# Patient Record
Sex: Male | Born: 1955 | Race: Black or African American | Hispanic: No | Marital: Married | Smoking: Former smoker
Health system: Southern US, Community
[De-identification: ages and names within clinical notes are randomized; demographics above are authoritative.]

## PROBLEM LIST (undated history)

## (undated) DIAGNOSIS — E119 Type 2 diabetes mellitus without complications: Secondary | ICD-10-CM

## (undated) DIAGNOSIS — N186 End stage renal disease: Secondary | ICD-10-CM

## (undated) DIAGNOSIS — Z923 Personal history of irradiation: Secondary | ICD-10-CM

## (undated) DIAGNOSIS — K649 Unspecified hemorrhoids: Secondary | ICD-10-CM

## (undated) DIAGNOSIS — G4730 Sleep apnea, unspecified: Secondary | ICD-10-CM

## (undated) DIAGNOSIS — Z973 Presence of spectacles and contact lenses: Secondary | ICD-10-CM

## (undated) DIAGNOSIS — I10 Essential (primary) hypertension: Secondary | ICD-10-CM

## (undated) DIAGNOSIS — M1990 Unspecified osteoarthritis, unspecified site: Secondary | ICD-10-CM

## (undated) DIAGNOSIS — Z9189 Other specified personal risk factors, not elsewhere classified: Secondary | ICD-10-CM

## (undated) DIAGNOSIS — J449 Chronic obstructive pulmonary disease, unspecified: Secondary | ICD-10-CM

## (undated) DIAGNOSIS — K219 Gastro-esophageal reflux disease without esophagitis: Secondary | ICD-10-CM

## (undated) DIAGNOSIS — C61 Malignant neoplasm of prostate: Secondary | ICD-10-CM

## (undated) DIAGNOSIS — E785 Hyperlipidemia, unspecified: Secondary | ICD-10-CM

## (undated) DIAGNOSIS — M109 Gout, unspecified: Secondary | ICD-10-CM

## (undated) DIAGNOSIS — Z972 Presence of dental prosthetic device (complete) (partial): Secondary | ICD-10-CM

## (undated) DIAGNOSIS — Z992 Dependence on renal dialysis: Secondary | ICD-10-CM

## (undated) HISTORY — PX: PROSTATE BIOPSY: SHX241

## (undated) HISTORY — DX: Gout, unspecified: M10.9

## (undated) HISTORY — PX: EYE SURGERY: SHX253

## (undated) HISTORY — DX: Essential (primary) hypertension: I10

## (undated) HISTORY — DX: Dependence on renal dialysis: N18.6

## (undated) HISTORY — DX: Dependence on renal dialysis: Z99.2

## (undated) HISTORY — DX: Hyperlipidemia, unspecified: E78.5

---

## 1983-02-17 HISTORY — PX: KNEE ARTHROSCOPY: SUR90

## 1998-05-15 ENCOUNTER — Emergency Department (HOSPITAL_COMMUNITY): Admission: EM | Admit: 1998-05-15 | Discharge: 1998-05-15 | Payer: Self-pay | Admitting: Emergency Medicine

## 1998-05-15 ENCOUNTER — Encounter: Payer: Self-pay | Admitting: Emergency Medicine

## 2000-06-11 ENCOUNTER — Encounter: Payer: Self-pay | Admitting: Preventative Medicine

## 2000-06-11 ENCOUNTER — Ambulatory Visit (HOSPITAL_COMMUNITY): Admission: RE | Admit: 2000-06-11 | Discharge: 2000-06-11 | Payer: Self-pay | Admitting: Preventative Medicine

## 2001-07-30 ENCOUNTER — Emergency Department (HOSPITAL_COMMUNITY): Admission: EM | Admit: 2001-07-30 | Discharge: 2001-07-30 | Payer: Self-pay | Admitting: Emergency Medicine

## 2002-05-19 ENCOUNTER — Encounter: Payer: Self-pay | Admitting: Emergency Medicine

## 2002-05-19 ENCOUNTER — Emergency Department (HOSPITAL_COMMUNITY): Admission: EM | Admit: 2002-05-19 | Discharge: 2002-05-20 | Payer: Self-pay | Admitting: *Deleted

## 2005-06-01 ENCOUNTER — Ambulatory Visit (HOSPITAL_COMMUNITY): Admission: RE | Admit: 2005-06-01 | Discharge: 2005-06-01 | Payer: Self-pay | Admitting: Internal Medicine

## 2005-06-01 ENCOUNTER — Encounter: Payer: Self-pay | Admitting: Internal Medicine

## 2005-06-01 ENCOUNTER — Ambulatory Visit: Payer: Self-pay | Admitting: Internal Medicine

## 2005-06-01 ENCOUNTER — Ambulatory Visit (HOSPITAL_COMMUNITY): Admission: RE | Admit: 2005-06-01 | Discharge: 2005-06-01 | Payer: Self-pay | Admitting: Family Medicine

## 2005-06-01 HISTORY — PX: COLONOSCOPY: SHX174

## 2011-02-25 ENCOUNTER — Other Ambulatory Visit (HOSPITAL_COMMUNITY): Payer: Self-pay | Admitting: Urology

## 2011-02-25 DIAGNOSIS — R972 Elevated prostate specific antigen [PSA]: Secondary | ICD-10-CM

## 2011-03-03 ENCOUNTER — Other Ambulatory Visit (HOSPITAL_COMMUNITY): Payer: Self-pay

## 2011-05-08 ENCOUNTER — Telehealth (HOSPITAL_COMMUNITY): Payer: Self-pay | Admitting: Dietician

## 2011-05-08 NOTE — Telephone Encounter (Signed)
Received referral for Dr. Antonietta Barcelona office for dx: diabetes.

## 2011-05-14 NOTE — Telephone Encounter (Signed)
Sent letter to pt home via US Mail in attempt to contact pt to schedule appointment.  

## 2011-05-20 NOTE — Telephone Encounter (Signed)
Sent letter to pt home via US Mail in attempt to contact pt to schedule appointment.  

## 2011-05-26 NOTE — Telephone Encounter (Signed)
Pt has not responded to attempts to contact to schedule appointment. Referral filed.  

## 2011-12-15 ENCOUNTER — Ambulatory Visit (INDEPENDENT_AMBULATORY_CARE_PROVIDER_SITE_OTHER): Payer: BC Managed Care – PPO | Admitting: Family Medicine

## 2011-12-15 ENCOUNTER — Encounter: Payer: Self-pay | Admitting: Family Medicine

## 2011-12-15 VITALS — BP 160/74 | HR 76 | Resp 15 | Ht 76.0 in | Wt 289.4 lb

## 2011-12-15 DIAGNOSIS — E119 Type 2 diabetes mellitus without complications: Secondary | ICD-10-CM

## 2011-12-15 DIAGNOSIS — I10 Essential (primary) hypertension: Secondary | ICD-10-CM

## 2011-12-15 DIAGNOSIS — E669 Obesity, unspecified: Secondary | ICD-10-CM

## 2011-12-15 DIAGNOSIS — E118 Type 2 diabetes mellitus with unspecified complications: Secondary | ICD-10-CM | POA: Insufficient documentation

## 2011-12-15 DIAGNOSIS — N189 Chronic kidney disease, unspecified: Secondary | ICD-10-CM

## 2011-12-15 DIAGNOSIS — C61 Malignant neoplasm of prostate: Secondary | ICD-10-CM

## 2011-12-15 NOTE — Assessment & Plan Note (Signed)
Uncontrolled, with diabetic retinopathy as complication Check [id number], his fastings sound like they are improved

## 2011-12-15 NOTE — Assessment & Plan Note (Signed)
Obtain repeat labs, will likely need referral to renal, no signs of obstruction

## 2011-12-15 NOTE — Assessment & Plan Note (Signed)
Uncontrolled, no change to day based on intial reading, review records

## 2011-12-15 NOTE — Progress Notes (Signed)
  Subjective:    Patient ID: Bob Maldonado, male    DOB: 07/16/1955, 56 y.o.   MRN: [id number]  HPI  Pt here to establish care, previous PCP [facility] [place] [facility] and history reviewed Diagnosed with prostate cancer in Feb 2013, was to have intervention however this was put on hold due to CRI with creatinine at 2.2 in April , this has not been worked up per report. He did have MRI abd/pelvis at [facility] DM- for past 15 years, taken off of oral medications this year because of renal problems, has been on glucovance, januvia, glimperide, currently with A1C 9% in Feb, on Lantus 20 units, fasting 79-180 Has diabetic retinopathy recently treated  Review of Systems   GEN- denies fatigue, fever, weight loss,weakness, recent illness HEENT- denies eye drainage, change in vision, nasal discharge, CVS- denies chest pain, palpitations RESP- denies SOB, cough, wheeze ABD- denies N/V, change in stools, abd pain GU- denies dysuria, hematuria, dribbling, incontinence MSK- denies joint pain, muscle aches, injury Neuro- denies headache, dizziness, syncope, seizure activity      Objective:   Physical Exam  GEN- NAD, alert and oriented x3, obese HEENT- PERRL, EOMI, non injected sclera, pink conjunctiva, MMM, oropharynx clear Neck- Supple,  CVS- RRR, no murmur RESP-CTAB ABS-NABS,soft,NT,ND EXT- No edema Pulses- Radial, DP- 2+ Psych-normal affect and Mood      Assessment & Plan:

## 2011-12-15 NOTE — Patient Instructions (Addendum)
Get the labs done today You will need referral pending on your labs  No change in medications today  We will call with lab results  Records to be obtained  F/U 3 months

## 2011-12-15 NOTE — Assessment & Plan Note (Addendum)
Records to be obtained, his renal insufficiency needs to be worked up so that he can proceed with treatment, though he does not seem to be expect any haste in getting treatment

## 2011-12-17 ENCOUNTER — Ambulatory Visit: Payer: Self-pay | Admitting: Family Medicine

## 2012-02-18 LAB — CBC
Platelets: 265 10*3/uL (ref 150–400)
RDW: 15 % (ref 11.5–15.5)
WBC: 6.3 10*3/uL (ref 4.0–10.5)

## 2012-02-19 ENCOUNTER — Telehealth: Payer: Self-pay | Admitting: Family Medicine

## 2012-02-19 ENCOUNTER — Other Ambulatory Visit: Payer: Self-pay

## 2012-02-19 DIAGNOSIS — N189 Chronic kidney disease, unspecified: Secondary | ICD-10-CM

## 2012-02-19 LAB — BASIC METABOLIC PANEL
Calcium: 8.3 mg/dL — ABNORMAL LOW (ref 8.4–10.5)
Chloride: 109 mEq/L (ref 96–112)
Creat: 4.04 mg/dL — ABNORMAL HIGH (ref 0.50–1.35)
Sodium: 139 mEq/L (ref 135–145)

## 2012-02-19 LAB — ESTIMATED GFR
GFR, Est African American: 18 mL/min — ABNORMAL LOW
GFR, Est Non African American: 15 mL/min — ABNORMAL LOW

## 2012-02-19 MED ORDER — INSULIN GLARGINE 100 UNIT/ML ~~LOC~~ SOLN: 20.0000 [IU] | Freq: Every day | SUBCUTANEOUS | Status: DC

## 2012-02-19 MED ORDER — INSULIN PEN NEEDLE 31G X 8 MM MISC: 20.0000 [IU] | Freq: Every day | Status: DC

## 2012-02-19 NOTE — Telephone Encounter (Signed)
Pt given results, increase Lantus to 24 units Appt with nephrology on Wed for CKD worsening He is asymptomatic, urinating without difficulty no swelling, SOB, CP

## 2012-02-24 ENCOUNTER — Other Ambulatory Visit (HOSPITAL_COMMUNITY): Payer: Self-pay | Admitting: Nephrology

## 2012-02-24 DIAGNOSIS — N289 Disorder of kidney and ureter, unspecified: Secondary | ICD-10-CM

## 2012-03-02 ENCOUNTER — Ambulatory Visit (HOSPITAL_COMMUNITY)
Admission: RE | Admit: 2012-03-02 | Discharge: 2012-03-02 | Disposition: A | Discharge status: Home / Self Care | Payer: BC Managed Care – PPO | Source: Ambulatory Visit | Attending: Nephrology | Admitting: Nephrology

## 2012-03-02 DIAGNOSIS — N289 Disorder of kidney and ureter, unspecified: Secondary | ICD-10-CM

## 2012-03-25 ENCOUNTER — Other Ambulatory Visit: Payer: Self-pay | Admitting: Family Medicine

## 2012-04-19 ENCOUNTER — Other Ambulatory Visit: Payer: Self-pay

## 2012-04-19 MED ORDER — PRAVASTATIN SODIUM 40 MG PO TABS: 40.0000 mg | ORAL_TABLET | Freq: Every day | ORAL | Status: DC

## 2012-06-17 ENCOUNTER — Other Ambulatory Visit: Payer: Self-pay | Admitting: *Deleted

## 2012-06-19 ENCOUNTER — Other Ambulatory Visit: Payer: Self-pay | Admitting: Family Medicine

## 2012-07-12 ENCOUNTER — Ambulatory Visit (INDEPENDENT_AMBULATORY_CARE_PROVIDER_SITE_OTHER): Payer: BC Managed Care – PPO | Admitting: Family Medicine

## 2012-07-12 ENCOUNTER — Encounter: Payer: Self-pay | Admitting: Family Medicine

## 2012-07-12 VITALS — BP 160/90 | HR 63 | Resp 16 | Wt 280.0 lb

## 2012-07-12 DIAGNOSIS — E785 Hyperlipidemia, unspecified: Secondary | ICD-10-CM

## 2012-07-12 DIAGNOSIS — E119 Type 2 diabetes mellitus without complications: Secondary | ICD-10-CM

## 2012-07-12 DIAGNOSIS — M7989 Other specified soft tissue disorders: Secondary | ICD-10-CM

## 2012-07-12 DIAGNOSIS — M109 Gout, unspecified: Secondary | ICD-10-CM

## 2012-07-12 DIAGNOSIS — N189 Chronic kidney disease, unspecified: Secondary | ICD-10-CM

## 2012-07-12 DIAGNOSIS — E669 Obesity, unspecified: Secondary | ICD-10-CM

## 2012-07-12 DIAGNOSIS — I10 Essential (primary) hypertension: Secondary | ICD-10-CM

## 2012-07-12 DIAGNOSIS — C61 Malignant neoplasm of prostate: Secondary | ICD-10-CM

## 2012-07-12 DIAGNOSIS — E118 Type 2 diabetes mellitus with unspecified complications: Secondary | ICD-10-CM

## 2012-07-12 MED ORDER — PRAVASTATIN SODIUM 40 MG PO TABS: 40.0000 mg | ORAL_TABLET | Freq: Every day | ORAL | Status: DC

## 2012-07-12 MED ORDER — NIFEDIPINE ER 90 MG PO TB24: 90.0000 mg | ORAL_TABLET | Freq: Every day | ORAL | Status: DC

## 2012-07-12 MED ORDER — GLIMEPIRIDE 4 MG PO TABS: ORAL_TABLET | ORAL | Status: DC

## 2012-07-12 MED ORDER — PREDNISONE 10 MG PO TABS: ORAL_TABLET | ORAL | Status: DC

## 2012-07-12 MED ORDER — FUROSEMIDE 20 MG PO TABS: 20.0000 mg | ORAL_TABLET | Freq: Two times a day (BID) | ORAL | Status: DC

## 2012-07-12 MED ORDER — METOPROLOL SUCCINATE ER 100 MG PO TB24: 100.0000 mg | ORAL_TABLET | Freq: Every day | ORAL | Status: DC

## 2012-07-12 NOTE — Patient Instructions (Signed)
Increase lantus to 30units  Start prednisone as directed Epsom salt bath  Get the labs done in the morning  Continue the lasix  F/U 3 months [employer]

## 2012-07-12 NOTE — Progress Notes (Signed)
  Subjective:    Patient ID: Bob Maldonado, male    DOB: 12-22-1955, 57 y.o.   MRN: [id number]  HPI Patient is here secondary to right great toe swelling and some knee pain. This started about 8 days ago he's been told he has gout in the past. Pain started with swelling and pain in his right knee then it went to his great toe it has been significantly painful since then. He did take some over-the-counter ibuprofen. Of note he has not followed up since his initial visit in October 2013. He has untreated prostate cancer as well as chronic kidney disease with the last creatinine at 4 he did see nephrology for one visit however he was told he would need dialysis therefore he did not return. There was a medication changes losartan was discontinued he was started on Lasix twice a day as well as nifedipine. His blood sugars have been 150s fasting he denies any hypoglycemia.   Review of Systems  GEN- denies fatigue, fever, weight loss,weakness, recent illness HEENT- denies eye drainage, change in vision, nasal discharge, CVS- denies chest pain, palpitations RESP- denies SOB, cough, wheeze ABD- denies N/V, change in stools, abd pain GU- denies dysuria, hematuria, dribbling, incontinence MSK- + joint pain, muscle aches, injury Neuro- denies headache, dizziness, syncope, seizure activity      Objective:   Physical Exam GEN- NAD, alert and oriented x3, obese +weight loss HEENT- PERRL, EOMI, non injected sclera, pink conjunctiva, MMM, oropharynx clear CVS- RRR, no murmur RESP-CTAB ABS-NABS,soft,NT,ND EXT- trace pitting  Edema RLE, swelling right great toe, TTP decrease ROM with flexion due to pain, bilateral knees, effusion, fair ROM, liagaments in tact  Pulses- Radial, DP- 2+        Assessment & Plan:

## 2012-07-13 LAB — CBC
HCT: 35.5 % — ABNORMAL LOW (ref 39.0–52.0)
MCH: 26.5 pg (ref 26.0–34.0)
MCV: 79.8 fL (ref 78.0–100.0)
RDW: 14.6 % (ref 11.5–15.5)
WBC: 12.4 10*3/uL — ABNORMAL HIGH (ref 4.0–10.5)

## 2012-07-14 ENCOUNTER — Telehealth: Payer: Self-pay | Admitting: Family Medicine

## 2012-07-14 DIAGNOSIS — M109 Gout, unspecified: Secondary | ICD-10-CM | POA: Insufficient documentation

## 2012-07-14 DIAGNOSIS — E785 Hyperlipidemia, unspecified: Secondary | ICD-10-CM | POA: Insufficient documentation

## 2012-07-14 LAB — URIC ACID: Uric Acid, Serum: 9.9 mg/dL — ABNORMAL HIGH (ref 4.0–7.8)

## 2012-07-14 LAB — COMPREHENSIVE METABOLIC PANEL
ALT: 12 U/L (ref 0–53)
AST: 12 U/L (ref 0–37)
Albumin: 3.5 g/dL (ref 3.5–5.2)
Alkaline Phosphatase: 113 U/L (ref 39–117)
BUN: 90 mg/dL — ABNORMAL HIGH (ref 6–23)
Chloride: 106 mEq/L (ref 96–112)
Potassium: 5.1 mEq/L (ref 3.5–5.3)
Sodium: 140 mEq/L (ref 135–145)

## 2012-07-14 LAB — LIPID PANEL
Cholesterol: 248 mg/dL — ABNORMAL HIGH (ref 0–200)
LDL Cholesterol: 171 mg/dL — ABNORMAL HIGH (ref 0–99)
Total CHOL/HDL Ratio: 4.1 Ratio
VLDL: 17 mg/dL (ref 0–40)

## 2012-07-14 NOTE — Assessment & Plan Note (Signed)
He continues to decline any treatment for his prostate cancer. He is a very religious person and does not want any intervention

## 2012-07-14 NOTE — Assessment & Plan Note (Signed)
Continues to work on weight loss.

## 2012-07-14 NOTE — Telephone Encounter (Signed)
Called and left message for patient to discuss his labs. She needs to return to nephrology his renal function is around 10%. His creatinine has worsened to 6.2 we need to increase his Lasix to 40 mg twice a day

## 2012-07-14 NOTE — Assessment & Plan Note (Signed)
Gout attack , due to CKD, will use prednisone to help control pain

## 2012-07-14 NOTE — Assessment & Plan Note (Signed)
His repeat labs show severely worsening kidney function his GFR is only at 10. He needs followup with nephrology I will call and discuss this with him

## 2012-07-14 NOTE — Assessment & Plan Note (Signed)
Repeat A1c shows fairly well control of diabetes at 7.1%. He will be on prednisone for his gout therefore we have increased his Lantus to 30 units

## 2012-07-14 NOTE — Assessment & Plan Note (Signed)
LDL is above goal of 100 for pt with CKD and DM, he needs statin therapy, will see if we can get him to comply with other treatments, will need statin drug in near future

## 2012-07-14 NOTE — Assessment & Plan Note (Addendum)
Pressure is uncontrolled, . After review of his labs and discussion with nephrology we will increase his Lasix to 40 mg twice a day

## 2012-07-15 NOTE — Telephone Encounter (Signed)
See lab note, discussed with pt Left VM for [facility]- Dr. Hinda Lenis to get appt ASAP

## 2012-07-20 ENCOUNTER — Telehealth: Payer: Self-pay | Admitting: Family Medicine

## 2012-07-20 ENCOUNTER — Other Ambulatory Visit: Payer: Self-pay | Admitting: Family Medicine

## 2012-07-20 NOTE — Telephone Encounter (Signed)
Do you want this pt to continue with predinisone?

## 2012-07-20 NOTE — Telephone Encounter (Signed)
No further refills

## 2012-07-20 NOTE — Telephone Encounter (Signed)
Refill denied 

## 2012-08-03 ENCOUNTER — Telehealth: Payer: Self-pay | Admitting: Family Medicine

## 2012-08-03 NOTE — Telephone Encounter (Signed)
Left a message for Bob Maldonado on wifes number as he does not answer his cell phone I need to discuss with him CKD stage VI and his decision on dialysis.

## 2012-08-09 ENCOUNTER — Telehealth: Payer: Self-pay | Admitting: Family Medicine

## 2012-08-09 NOTE — Telephone Encounter (Signed)
Message copied by Maureen Chatters on Tue Aug 09, 2012 11:09 AM ------      Message from: Bob Maldonado      Created: Mon Aug 08, 2012 10:08 AM      Regarding: Please call pt and get him to schedule a follow-up appointment to discuss his medical problems       I have tried calling his phone and his wifes      If you can not get him, send a certified letter requesting appointment ------

## 2012-08-09 NOTE — Telephone Encounter (Signed)
Called and spoke to wife sheila and letting her know that pt is needing to make appt to discuss his medical concerns, wife states she will let him know.

## 2012-08-12 ENCOUNTER — Telehealth: Payer: Self-pay | Admitting: Family Medicine

## 2012-08-12 NOTE — Telephone Encounter (Signed)
Left message to return my call.  

## 2012-08-24 ENCOUNTER — Encounter: Payer: Self-pay | Admitting: Physician Assistant

## 2012-08-24 ENCOUNTER — Ambulatory Visit (INDEPENDENT_AMBULATORY_CARE_PROVIDER_SITE_OTHER): Payer: BC Managed Care – PPO | Admitting: Physician Assistant

## 2012-08-24 ENCOUNTER — Telehealth: Payer: Self-pay | Admitting: Family Medicine

## 2012-08-24 VITALS — BP 166/90 | HR 64 | Temp 97.9°F | Resp 20 | Ht 76.0 in | Wt 286.0 lb

## 2012-08-24 DIAGNOSIS — E785 Hyperlipidemia, unspecified: Secondary | ICD-10-CM

## 2012-08-24 DIAGNOSIS — N184 Chronic kidney disease, stage 4 (severe): Secondary | ICD-10-CM

## 2012-08-24 DIAGNOSIS — E118 Type 2 diabetes mellitus with unspecified complications: Secondary | ICD-10-CM

## 2012-08-24 DIAGNOSIS — E669 Obesity, unspecified: Secondary | ICD-10-CM

## 2012-08-24 DIAGNOSIS — C61 Malignant neoplasm of prostate: Secondary | ICD-10-CM

## 2012-08-24 DIAGNOSIS — I10 Essential (primary) hypertension: Secondary | ICD-10-CM

## 2012-08-24 DIAGNOSIS — M109 Gout, unspecified: Secondary | ICD-10-CM

## 2012-08-24 MED ORDER — PREDNISONE 20 MG PO TABS: ORAL_TABLET | ORAL | Status: DC

## 2012-08-24 NOTE — Telephone Encounter (Signed)
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 Diet changes make very little impact/difference, especially given his kidney disease.  There are meds available that help excrete uric acid but they cannot be used with his kidney problems. With his poor kidney function, I do not think diet changes are going to make much difference.Just take the prednisone as directed at [facility].

## 2012-08-25 NOTE — Telephone Encounter (Signed)
Spoke to wife,  Discussed dietary recommendation for gout patients and offered to send her handout on foods to avoid for gout patients.  Info mailed.

## 2012-08-25 NOTE — Progress Notes (Signed)
Patient ID: Bob Maldonado MRN: [id number], DOB: 1955/12/30, 57 y.o. Date of Encounter: @DATE @  Chief Complaint:  Chief Complaint  Patient presents with  . c/o gout flare up right foot    HPI: 57 y.o. year old [place]  male who is a patient of Dr. Buelah Manis presents with c/o pain in Right 1st to and Right ankle.   He has h/o severe CKD but he only saw renal once. They mentioned HD and he did not return for f/u. He has religious beliefs that are the basis for his decision making. He also has prostate cancer but has refused f/u with Urology. He also has DM andis on insulin.   He saw Dr. Buelah Manis 07/12/12 with c/o right 1st toe pain as well as in Right Knee. He had a prior h/o gout and the pain 5/27 was dx as gout. He was treated with oral prednisone taper. He says that while on the prednisone, the pain and swelling resolved. Once he was off prednisone for 2 days, it returned. Since then, some days pain is better, some days it is worse, but it has been there sinc e2 days after completion of prednisone.    Past Medical History  Diagnosis Date  . Diabetes mellitus without complication   . Hypertension   . Hyperlipidemia   . Cancer     prostate   . Chronic kidney disease      Home Meds: See attached medication section for current medication list. Any medications entered into computer today will not appear on this note's list. The medications listed below were entered prior to today. Current Outpatient Prescriptions on File Prior to Visit  Medication Sig Dispense Refill  . furosemide (LASIX) 20 MG tablet Take 40 mg by mouth 2 (two) times daily.      Marland Kitchen glimepiride (AMARYL) 4 MG tablet TAKE 1 TABLET EVERY DAY  90 tablet  2  . insulin glargine (LANTUS) 100 UNIT/ML injection Inject 30 Units into the skin at bedtime.      . Insulin Pen Needle 31G X 8 MM MISC 20 Units by Does not apply route daily.  50 each  2  . metoprolol succinate (TOPROL XL) 100 MG 24 hr tablet Take 1 tablet (100 mg total) by  mouth daily. Take with or immediately following a meal.  90 tablet  2  . NIFEdipine (ADALAT CC) 90 MG 24 hr tablet Take 1 tablet (90 mg total) by mouth daily.  90 tablet  2  . pravastatin (PRAVACHOL) 40 MG tablet Take 1 tablet (40 mg total) by mouth daily.  90 tablet  2  . predniSONE (DELTASONE) 10 MG tablet Take 40mg  by mouth daily x 2 days, then 30mg  x 2 days, then 20mg  x  2 days, then 10mg  x 2 days, then stop for gout  20 tablet  0   No current facility-administered medications on file prior to visit.    Allergies: No Known Allergies  History   Social History  . Marital Status: Single    Spouse Name: N/A    Number of Children: N/A  . Years of Education: N/A   Occupational History  . Not on file.   Social History Main Topics  . Smoking status: Former Research scientist (life sciences)  . Smokeless tobacco: Not on file  . Alcohol Use: Yes     Comment: rare  . Drug Use: No  . Sexually Active: Not on file   Other Topics Concern  . Not on file   Social  History Narrative  . No narrative on file    Family History  Problem Relation Age of Onset  . Diabetes Mother   . Heart disease Mother   . Cancer Father   . Heart disease Brother      Review of Systems:  See HPI for pertinent ROS. All other ROS negative.    Physical Exam: Blood pressure 166/90, pulse 64, temperature 97.9 F (36.6 C), temperature source Oral, resp. rate 20, height 6\' 4"  (1.93 m), weight 286 lb (129.729 kg)., Body mass index is 34.83 kg/(m^2). General: obese AAM. Appears in no acute distress. Lungs: Clear bilaterally to auscultation without wheezes, rales, or rhonchi. Breathing is unlabored. Heart: RRR with S1 S2. No murmurs, rubs, or gallops. Musculoskeletal:  Strength and tone normal for age. Extremities: Right 1st Toe is mildly swollen. It is extrememly tender even with very light palpation. There is mild tenderness with palpation of the ankle joint, with another area of pain just distal to the ankle joint. Neuro: Alert and  oriented X 3. Moves all extremities spontaneously. Gait is normal. CNII-XII grossly in tact. Psych:  Responds to questions appropriately with a normal affect.     ASSESSMENT AND PLAN:  57 y.o. year old male with  1. Gout - predniSONE (DELTASONE) 20 MG tablet; Take 3 daily for 2 days, then 2 daily for 2 days, then 1 daily for 2 days.  Dispense: 12 tablet; Refill: 0 If the pain/swelling returns after completion of the prednisone, he will call me and i will cont low dose prednisone. I explained to him that usually we limit the use of prednisone b/c of its systemic adverse effects. However, given his beliefs and the fact that he has prostate cancer (defers treatment) and severe CKD (defers tx), he would be agreeable to continue the prednisone despite the adverse effects. As well, he is aware that prednisone will increase his BS and he will titrate his insulin accordingly.    2. Chronic kidney disease, stage 4 (severe) Refuses HD, f/u with Renal  3. Diabetes mellitus with complication Titrate Insulin while on prednisone.  4. Essential hypertension, benign 5. Hyperlipidemia 6. Obesity 7. Prostate cancer    Signed, [place] Re[place], [place], [facility] 08/25/2012 6:21 AM

## 2012-08-26 ENCOUNTER — Other Ambulatory Visit: Payer: Self-pay | Admitting: Family Medicine

## 2012-08-31 ENCOUNTER — Other Ambulatory Visit: Payer: Self-pay | Admitting: Family Medicine

## 2012-09-02 ENCOUNTER — Telehealth: Payer: Self-pay | Admitting: Family Medicine

## 2012-09-02 MED ORDER — INSULIN GLARGINE 100 UNIT/ML ~~LOC~~ SOLN: 30.0000 [IU] | Freq: Every day | SUBCUTANEOUS | Status: DC

## 2012-09-02 NOTE — Telephone Encounter (Signed)
Med refill

## 2012-09-05 ENCOUNTER — Telehealth: Payer: Self-pay | Admitting: Family Medicine

## 2012-09-05 MED ORDER — INSULIN GLARGINE 100 UNIT/ML SOLOSTAR PEN: PEN_INJECTOR | SUBCUTANEOUS | Status: DC

## 2012-09-05 NOTE — Telephone Encounter (Signed)
Med refilled.

## 2012-09-07 ENCOUNTER — Telehealth: Payer: Self-pay | Admitting: Family Medicine

## 2012-09-07 MED ORDER — PREDNISONE 5 MG PO TABS: 5.0000 mg | ORAL_TABLET | Freq: Every day | ORAL | Status: DC

## 2012-09-07 NOTE — Telephone Encounter (Signed)
Reviewed note seen by Bob Maldonado, pt understands long term effects of prednisone, prefers to continue low dose prednisone, will give 5mg  daily for prophylaxis against gout

## 2012-09-07 NOTE — Telephone Encounter (Signed)
Called pt and he is wanting prednisone for his gout? Can you refill please? Wants 90 day supply

## 2012-10-26 ENCOUNTER — Encounter: Payer: Self-pay | Admitting: Family Medicine

## 2012-10-26 ENCOUNTER — Telehealth: Payer: Self-pay | Admitting: Family Medicine

## 2012-10-26 ENCOUNTER — Ambulatory Visit (INDEPENDENT_AMBULATORY_CARE_PROVIDER_SITE_OTHER): Payer: BC Managed Care – PPO | Admitting: Family Medicine

## 2012-10-26 VITALS — BP 138/80 | HR 68 | Temp 97.5°F | Resp 16 | Wt 277.0 lb

## 2012-10-26 DIAGNOSIS — I10 Essential (primary) hypertension: Secondary | ICD-10-CM

## 2012-10-26 DIAGNOSIS — M109 Gout, unspecified: Secondary | ICD-10-CM

## 2012-10-26 DIAGNOSIS — N185 Chronic kidney disease, stage 5: Secondary | ICD-10-CM | POA: Insufficient documentation

## 2012-10-26 DIAGNOSIS — N189 Chronic kidney disease, unspecified: Secondary | ICD-10-CM

## 2012-10-26 DIAGNOSIS — E118 Type 2 diabetes mellitus with unspecified complications: Secondary | ICD-10-CM

## 2012-10-26 LAB — BASIC METABOLIC PANEL WITH GFR
BUN: 90 mg/dL — ABNORMAL HIGH (ref 6–23)
CO2: 19 meq/L (ref 19–32)
Calcium: 7 mg/dL — ABNORMAL LOW (ref 8.4–10.5)
Chloride: 111 meq/L (ref 96–112)
Creat: 8.68 mg/dL — ABNORMAL HIGH (ref 0.50–1.35)
Glucose, Bld: 146 mg/dL — ABNORMAL HIGH (ref 70–99)
Potassium: 5 meq/L (ref 3.5–5.3)
Sodium: 142 meq/L (ref 135–145)

## 2012-10-26 LAB — URIC ACID: Uric Acid, Serum: 9.9 mg/dL — ABNORMAL HIGH (ref 4.0–7.8)

## 2012-10-26 LAB — HEMOGLOBIN A1C
Hgb A1c MFr Bld: 6.7 % — ABNORMAL HIGH
Mean Plasma Glucose: 146 mg/dL — ABNORMAL HIGH

## 2012-10-26 MED ORDER — PREDNISONE 10 MG PO TABS: 10.0000 mg | ORAL_TABLET | Freq: Every day | ORAL | Status: DC

## 2012-10-26 NOTE — Assessment & Plan Note (Signed)
We had a long discussion about the side effects of prednisone. As he has untreated kidney disease as well as untreated cancer he is aside he would like to continue with the prednisone knowing the side effects of adrenal problems or worsening diabetes and hypertension. Put him on prednisone 10 mg daily. If he has a flare we will have to bump him to a higher dose and taper down

## 2012-10-26 NOTE — Progress Notes (Signed)
  Subjective:    Patient ID: Bob Maldonado, male    DOB: 1955/07/26, 57 y.o.   MRN: [id number]  HPI  Patient here for gout flare up in left foot/ankle. He is a followup for his chronic medical problems. He has end-stage kidney disease and has declined dialysis. I had a lot of difficulty getting him to respond to any phone calls or messages/ He also did not respond back to the nephrologist who is trying to contact him. He also has untreated prostate cancer. At his last visit in our office he was here for gout flare and decision was made to put him on chronic prednisone after a long discussion regarding inability to use other medications and the risk of prednisone. He states that this is the only thing that helps therefore decided to use the prednisone. Most days he actually takes 10 mg of prednisone daily and when he had a flare he takes 10 mg twice a day. Diabetes mellitus he states his blood sugars have been good although he only checks it a couple times a week. He's been taking Amaryl as well as Lantus 30 units. He does admit to a few hypoglycemic episodes where his sugar is down to 50 and 60.   Review of Systems  GEN- denies fatigue, fever, weight loss,weakness, recent illness HEENT- denies eye drainage, change in vision, nasal discharge, CVS- denies chest pain, palpitations RESP- denies SOB, cough, wheeze ABD- denies N/V, change in stools, abd pain GU- denies dysuria, hematuria, dribbling, incontinence MSK- +joint pain, muscle aches, injury Neuro- denies headache, dizziness, syncope, seizure activity      Objective:   Physical Exam GEN- NAD, alert and oriented x3, obese  HEENT- PERRL, EOMI, non injected sclera, pink conjunctiva, MMM, oropharynx clear CVS- RRR, no murmur RESP-CTABD EXT- trace pitting  Edema bilat, no erythema or swelling of left great toe, NT to palpation, normal ROM Pulses- Radial, DP- 2+         Assessment & Plan:

## 2012-10-26 NOTE — Patient Instructions (Addendum)
Decrease insulin to 25 units  Prednisone 10mg  daily  Stop glimerperide Continue all other medications F/U 4 months

## 2012-10-26 NOTE — Assessment & Plan Note (Signed)
D/c amaryl, due to renal disease. Lantus down to 25 units due to hypoglycemia and worsening renal function

## 2012-10-26 NOTE — Assessment & Plan Note (Signed)
Improved with lasix

## 2012-10-26 NOTE — Telephone Encounter (Signed)
He should have 10mg  prednisone

## 2012-10-26 NOTE — Telephone Encounter (Signed)
Called pharmacy and they are aware of dose change to Prednisone

## 2012-10-26 NOTE — Telephone Encounter (Signed)
[facility] say that pt just picked up a Rx for Prenisone 5 mg 1 QD. They want to know if the Prednisone 10 mg 1 QD that you just e-scribed is a dose increase.

## 2012-11-15 ENCOUNTER — Other Ambulatory Visit: Payer: Self-pay | Admitting: Family Medicine

## 2012-11-16 NOTE — Telephone Encounter (Signed)
Medication refilled per protocol. 

## 2012-11-28 ENCOUNTER — Encounter: Payer: Self-pay | Admitting: Family Medicine

## 2013-02-19 ENCOUNTER — Encounter: Payer: Self-pay | Admitting: Radiation Oncology

## 2013-02-27 ENCOUNTER — Telehealth: Payer: Self-pay | Admitting: *Deleted

## 2013-02-27 MED ORDER — FUROSEMIDE 20 MG PO TABS: 40.0000 mg | ORAL_TABLET | Freq: Two times a day (BID) | ORAL | Status: DC

## 2013-02-27 NOTE — Telephone Encounter (Signed)
Meds refilled.

## 2013-04-07 ENCOUNTER — Telehealth: Payer: Self-pay | Admitting: *Deleted

## 2013-04-07 ENCOUNTER — Other Ambulatory Visit: Payer: Self-pay | Admitting: *Deleted

## 2013-04-07 MED ORDER — FUROSEMIDE 20 MG PO TABS: 40.0000 mg | ORAL_TABLET | Freq: Two times a day (BID) | ORAL | Status: DC

## 2013-04-07 NOTE — Telephone Encounter (Signed)
Meds refilled.

## 2013-04-07 NOTE — Telephone Encounter (Signed)
?   Ok to refill, last refill 02/27/13, last ov 10/28/12

## 2013-04-07 NOTE — Telephone Encounter (Signed)
Redid pts lasix to #360 instead of #120 pt wanted 90 day supply

## 2013-04-07 NOTE — Telephone Encounter (Signed)
Okay to refill? 

## 2013-04-17 ENCOUNTER — Other Ambulatory Visit: Payer: Self-pay | Admitting: Family Medicine

## 2013-04-18 ENCOUNTER — Encounter (HOSPITAL_COMMUNITY): Payer: Self-pay | Admitting: Emergency Medicine

## 2013-04-18 ENCOUNTER — Emergency Department (HOSPITAL_COMMUNITY)
Admission: EM | Admit: 2013-04-18 | Discharge: 2013-04-18 | Disposition: A | Discharge status: Home / Self Care | Payer: BC Managed Care – PPO | Attending: Emergency Medicine | Admitting: Emergency Medicine

## 2013-04-18 ENCOUNTER — Other Ambulatory Visit: Payer: Self-pay | Admitting: *Deleted

## 2013-04-18 ENCOUNTER — Emergency Department (HOSPITAL_COMMUNITY): Payer: BC Managed Care – PPO

## 2013-04-18 DIAGNOSIS — E785 Hyperlipidemia, unspecified: Secondary | ICD-10-CM | POA: Insufficient documentation

## 2013-04-18 DIAGNOSIS — N186 End stage renal disease: Secondary | ICD-10-CM | POA: Insufficient documentation

## 2013-04-18 DIAGNOSIS — Z794 Long term (current) use of insulin: Secondary | ICD-10-CM | POA: Insufficient documentation

## 2013-04-18 DIAGNOSIS — I120 Hypertensive chronic kidney disease with stage 5 chronic kidney disease or end stage renal disease: Secondary | ICD-10-CM | POA: Insufficient documentation

## 2013-04-18 DIAGNOSIS — I10 Essential (primary) hypertension: Secondary | ICD-10-CM | POA: Insufficient documentation

## 2013-04-18 DIAGNOSIS — E119 Type 2 diabetes mellitus without complications: Secondary | ICD-10-CM | POA: Insufficient documentation

## 2013-04-18 DIAGNOSIS — Z87891 Personal history of nicotine dependence: Secondary | ICD-10-CM | POA: Insufficient documentation

## 2013-04-18 DIAGNOSIS — IMO0002 Reserved for concepts with insufficient information to code with codable children: Secondary | ICD-10-CM | POA: Insufficient documentation

## 2013-04-18 DIAGNOSIS — Z79899 Other long term (current) drug therapy: Secondary | ICD-10-CM | POA: Insufficient documentation

## 2013-04-18 DIAGNOSIS — Z8546 Personal history of malignant neoplasm of prostate: Secondary | ICD-10-CM | POA: Insufficient documentation

## 2013-04-18 DIAGNOSIS — H5710 Ocular pain, unspecified eye: Secondary | ICD-10-CM | POA: Insufficient documentation

## 2013-04-18 DIAGNOSIS — M129 Arthropathy, unspecified: Secondary | ICD-10-CM | POA: Insufficient documentation

## 2013-04-18 MED ORDER — TETRACAINE HCL 0.5 % OP SOLN
1.0000 [drp] | Freq: Once | OPHTHALMIC | Status: AC
  Administered 2013-04-18: 1 [drp] via OPHTHALMIC
  Filled 2013-04-18: qty 2

## 2013-04-18 MED ORDER — INSULIN GLARGINE 100 UNIT/ML SOLOSTAR PEN: 30.0000 [IU] | PEN_INJECTOR | Freq: Every day | SUBCUTANEOUS | Status: DC

## 2013-04-18 MED ORDER — TETRACAINE HCL 0.5 % OP SOLN
OPHTHALMIC | Status: AC
  Filled 2013-04-18: qty 2

## 2013-04-18 MED ORDER — ERYTHROMYCIN 5 MG/GM OP OINT
TOPICAL_OINTMENT | Freq: Four times a day (QID) | OPHTHALMIC | Status: DC
  Administered 2013-04-18: 1 via OPHTHALMIC
  Filled 2013-04-18: qty 3.5

## 2013-04-18 MED ORDER — INSULIN PEN NEEDLE 31G X 8 MM MISC: Status: DC

## 2013-04-18 NOTE — Telephone Encounter (Signed)
Refill appropriate and filled per protocol. 

## 2013-04-18 NOTE — ED Notes (Addendum)
Rt eye red, swollen.   Hemorrhage  To eye. No known injury. Went to dentist today ,nothing unusual  Happened to eye.  Took a nap and eye was red and swollen on awakening.  Vision is blurry

## 2013-04-18 NOTE — Telephone Encounter (Signed)
Received call from patient wife. Reported that pt requires 90 day supply of Insulin and insulin pen needles. Refill appropriate and filled per protocol to [facility].   Also reported that patient accidentally threw away Prednisone and requires new prescription. Call placed to [place]. 90 day supply requested. Advised that cost to patient will be $11.99.   Call placed to patient and patient made aware.

## 2013-04-18 NOTE — ED Provider Notes (Signed)
CSN: [id number]     Arrival date & time 04/18/13  1731 History   First MD Initiated Contact with Patient 04/18/13 1801     Chief Complaint  Patient presents with  . Eye Pain    HPI  Patient presents with right eye pain.  Pain began approximately 2 hours prior to my evaluation.  Pain is focally about the eye, worse with motion.  There is no associated visual loss. The pain is sore, severe. Patient states that he was in his usual state of health prior to the onset of symptoms. Patient has a history of hypertension, end-stage renal disease, prostate cancer. Patient takes oral medication, but has deferred dialysis and therapy for his cancer. However the patient's acute generally well until the onset of symptoms. Patient works as a Dealer, denies obvious foreign body entry into the, but endorses the possibility of dust particles entering the eye. Just prior to the onset of symptoms he was at the dentists office.  He was not sedated, denies memorable events during that visit.   Past Medical History  Diagnosis Date  . Diabetes mellitus without complication   . Hypertension   . Hyperlipidemia   . Gout   . Cancer     prostate / Declines treatment  . Chronic kidney disease     ESRD- Declines Dialysis   Past Surgical History  Procedure Laterality Date  . Right knee  1985   Family History  Problem Relation Age of Onset  . Diabetes Mother   . Heart disease Mother   . Cancer Father   . Heart disease Brother    History  Substance Use Topics  . Smoking status: Former Research scientist (life sciences)  . Smokeless tobacco: Not on file  . Alcohol Use: No     Comment: rare    Review of Systems  Constitutional:       Per HPI, otherwise negative  HENT:       Per HPI, otherwise negative  Eyes: Positive for pain, discharge, redness and visual disturbance. Negative for photophobia.  Respiratory:       Per HPI, otherwise negative  Cardiovascular:       Per HPI, otherwise negative  Gastrointestinal: Negative  for vomiting.  Endocrine:       Negative aside from HPI  Genitourinary:       Neg aside from HPI   Musculoskeletal:       Per HPI, otherwise negative  Skin: Negative.   Neurological: Negative for syncope.      Allergies  Review of patient's allergies indicates no known allergies.  Home Medications   Current Outpatient Rx  Name  Route  Sig  Dispense  Refill  . B-D UF III MINI PEN NEEDLES 31G X 5 MM MISC      USE AS DIRECTED EVERDAY   100 each   0   . fluticasone (FLONASE) 50 MCG/ACT nasal spray      USE 2 SPRAYS IN EACH NOSTRIL ONCE DAILY FOR ALLERGY FLARE UPS   1 g   prn   . furosemide (LASIX) 20 MG tablet   Oral   Take 2 tablets (40 mg total) by mouth 2 (two) times daily.   360 tablet   0   . Insulin Glargine (LANTUS SOLOSTAR) 100 UNIT/ML Solostar Pen   Subcutaneous   Inject 30 Units into the skin at bedtime.   10 pen   3   . metoprolol succinate (TOPROL XL) 100 MG 24 hr tablet   Oral  Take 1 tablet (100 mg total) by mouth daily. Take with or immediately following a meal.   90 tablet   2   . NIFEdipine (ADALAT CC) 90 MG 24 hr tablet   Oral   Take 1 tablet (90 mg total) by mouth daily.   90 tablet   2   . pravastatin (PRAVACHOL) 40 MG tablet   Oral   Take 1 tablet (40 mg total) by mouth daily.   90 tablet   2   . predniSONE (DELTASONE) 10 MG tablet   Oral   Take 1 tablet (10 mg total) by mouth daily.   90 tablet   3    BP 201/89  Pulse 76  Temp(Src) 98.3 F (36.8 C) (Oral)  Resp 18  Ht 6\' 3"  (1.905 m)  Wt 280 lb (127.007 kg)  BMI 35.00 kg/m2  SpO2 100% Physical Exam  Nursing note and vitals reviewed. Constitutional: He is oriented to person, place, and time. He appears well-developed. No distress.  HENT:  Head: Normocephalic and atraumatic.  Eyes: EOM are normal. Right eye exhibits chemosis and discharge. Right eye exhibits no exudate and no hordeolum. No foreign body present in the right eye. Left eye exhibits no chemosis, no  discharge, no exudate and no hordeolum. Right conjunctiva is injected. Right conjunctiva has a hemorrhage. Left conjunctiva is not injected. Left conjunctiva has no hemorrhage. Right eye exhibits normal extraocular motion and no nystagmus. Left eye exhibits normal extraocular motion and no nystagmus. Right pupil is round and reactive. Left pupil is round and reactive.  Cardiovascular: Normal rate and regular rhythm.   Pulmonary/Chest: Effort normal. No stridor. No respiratory distress.  Abdominal: He exhibits no distension.  Musculoskeletal: He exhibits no edema.  Neurological: He is alert and oriented to person, place, and time.  Skin: Skin is warm and dry.  Psychiatric: He has a normal mood and affect.    ED Course  Procedures (including critical care time)   Imaging Review Ct Orbitss W/o Cm  04/18/2013   CLINICAL DATA:  Diabetic hypertensive patient with end-stage renal disease. History of prostate cancer. No injury. Dental visit today. Took nap and awoke with red swollen right eye.  EXAM: CT ORBITS WITHOUT CONTRAST  TECHNIQUE: Multidetector CT imaging of the orbits was performed following the standard protocol without intravenous contrast.  COMPARISON:  None.  FINDINGS: Exophthalmos symmetric bilaterally.  Minimal soft tissue prominence preseptal position. In the appropriate clinical setting, mild cellulitis not excluded although no surrounding inflammation identified and I suspect this is normal for this patient.  No evidence of sinus disease or postseptal orbital inflammation.  Symmetric appearance of the superior ophthalmic veins. Contrast was not administered to evaluate cavernous sinus region. No gross abnormality identified.  Upper right canine post extends through the cortex of the maxilla without surrounding inflammation.  No osseous metastatic lesion noted.  IMPRESSION: Symmetric exophthalmos bilaterally.  Please see above discussion   Electronically Signed   By: Chauncey Cruel M.D.   On:  04/18/2013 19:20     Update: On exam the patient appears comfortable.  Vision is 20/40 in each eye, 20/25 with both eyes open.  Following placement of tetracaine patient had decrease in pain.  The patient's chemosis decreased during his emergency department stay. MDM   Final diagnoses:  Eye pain    Patient presents with new right eye discomfort and lesion.  This lesion is notable for limits para, chemosis, injection.  Patient's lesion is most consistent with subconjunctival hemorrhage.  No discernible foreign body, no loss of vision, no other complaints or neurologic deficits.  Patient has multiple medical problems, including hypertension, and end-stage renal disease for which she is foregoing dialysis, both possibly contributing to this presentation.  Patient was otherwise in no distress, hemodynamically stable.  Patient was discharged to follow up with ophthalmology tomorrow after initiation of erythromycin ointment for topical therapy.    Carmin Muskrat, MD 04/18/13 2112

## 2013-04-18 NOTE — Discharge Instructions (Signed)
AS discussed your eye discomfort is likely caused by a subconjunctival hemorrhage.    Please continue to keep the provided medication on the eye (every six hours) until you have seen your ophthalmologist.  Return here for any concerning changes in your condition.

## 2013-05-17 ENCOUNTER — Telehealth: Payer: Self-pay | Admitting: *Deleted

## 2013-05-17 NOTE — Telephone Encounter (Signed)
Wife made aware

## 2013-05-17 NOTE — Telephone Encounter (Signed)
Received call from patient wife, Adela Lank.   Reports that patient has increased edema noted to BLE. Requested MD advice on compression hose.

## 2013-05-17 NOTE — Telephone Encounter (Signed)
He can use the compression hose, I would recommend he come in for an appointment

## 2013-05-25 ENCOUNTER — Other Ambulatory Visit: Payer: Self-pay | Admitting: *Deleted

## 2013-05-25 ENCOUNTER — Telehealth: Payer: Self-pay | Admitting: *Deleted

## 2013-05-25 MED ORDER — NIFEDIPINE ER 90 MG PO TB24
90.0000 mg | ORAL_TABLET | Freq: Every day | ORAL | Status: DC
Start: 2013-05-25 — End: 2013-06-13

## 2013-05-25 NOTE — Telephone Encounter (Signed)
Message copied by Sheral Flow on Thu May 25, 2013 11:11 AM ------      Message from: Lenore Manner      Created: Thu May 25, 2013 10:58 AM      Regarding: leg swelling      Contact: [phone]       Pt wife called and made him an apt for tomorrow but she still wants to speak to you ------

## 2013-05-25 NOTE — Telephone Encounter (Signed)
Refill appropriate and filled per protocol. 

## 2013-05-25 NOTE — Telephone Encounter (Signed)
Returned call to patient wife Adela Lank.   Reports that patient has increased swelling to legs and is concerned. Believes that patient may be ready to discuss treatment.   States that edema is noted only below knees and no SOB noted.   Patient has appointment for 05/26/2013.

## 2013-05-26 ENCOUNTER — Ambulatory Visit (INDEPENDENT_AMBULATORY_CARE_PROVIDER_SITE_OTHER): Payer: BC Managed Care – PPO | Admitting: Family Medicine

## 2013-05-26 ENCOUNTER — Inpatient Hospital Stay (HOSPITAL_COMMUNITY)
Admission: EM | Admit: 2013-05-26 | Discharge: 2013-06-02 | DRG: 673 | Disposition: A | Discharge status: Home Health | Payer: BC Managed Care – PPO | Attending: Internal Medicine | Admitting: Internal Medicine

## 2013-05-26 ENCOUNTER — Encounter: Payer: Self-pay | Admitting: Family Medicine

## 2013-05-26 ENCOUNTER — Other Ambulatory Visit: Payer: Self-pay

## 2013-05-26 ENCOUNTER — Encounter (HOSPITAL_COMMUNITY): Payer: Self-pay | Admitting: Emergency Medicine

## 2013-05-26 ENCOUNTER — Ambulatory Visit (HOSPITAL_COMMUNITY)
Admission: RE | Admit: 2013-05-26 | Discharge: 2013-05-26 | Disposition: A | Discharge status: Home / Self Care | Payer: BC Managed Care – PPO | Source: Ambulatory Visit | Attending: Family Medicine | Admitting: Family Medicine

## 2013-05-26 VITALS — BP 148/70 | HR 78 | Temp 98.0°F | Resp 18 | Ht 75.0 in | Wt 276.0 lb

## 2013-05-26 DIAGNOSIS — D631 Anemia in chronic kidney disease: Secondary | ICD-10-CM | POA: Diagnosis present

## 2013-05-26 DIAGNOSIS — E118 Type 2 diabetes mellitus with unspecified complications: Secondary | ICD-10-CM

## 2013-05-26 DIAGNOSIS — N189 Chronic kidney disease, unspecified: Secondary | ICD-10-CM

## 2013-05-26 DIAGNOSIS — Z79899 Other long term (current) drug therapy: Secondary | ICD-10-CM

## 2013-05-26 DIAGNOSIS — E669 Obesity, unspecified: Secondary | ICD-10-CM

## 2013-05-26 DIAGNOSIS — M79609 Pain in unspecified limb: Secondary | ICD-10-CM | POA: Diagnosis not present

## 2013-05-26 DIAGNOSIS — R0602 Shortness of breath: Secondary | ICD-10-CM

## 2013-05-26 DIAGNOSIS — R112 Nausea with vomiting, unspecified: Secondary | ICD-10-CM | POA: Diagnosis not present

## 2013-05-26 DIAGNOSIS — N185 Chronic kidney disease, stage 5: Secondary | ICD-10-CM

## 2013-05-26 DIAGNOSIS — Z794 Long term (current) use of insulin: Secondary | ICD-10-CM

## 2013-05-26 DIAGNOSIS — E1129 Type 2 diabetes mellitus with other diabetic kidney complication: Secondary | ICD-10-CM | POA: Diagnosis present

## 2013-05-26 DIAGNOSIS — Z23 Encounter for immunization: Secondary | ICD-10-CM

## 2013-05-26 DIAGNOSIS — C61 Malignant neoplasm of prostate: Secondary | ICD-10-CM

## 2013-05-26 DIAGNOSIS — M109 Gout, unspecified: Secondary | ICD-10-CM | POA: Diagnosis present

## 2013-05-26 DIAGNOSIS — E785 Hyperlipidemia, unspecified: Secondary | ICD-10-CM | POA: Diagnosis present

## 2013-05-26 DIAGNOSIS — N2581 Secondary hyperparathyroidism of renal origin: Secondary | ICD-10-CM | POA: Diagnosis present

## 2013-05-26 DIAGNOSIS — E875 Hyperkalemia: Secondary | ICD-10-CM

## 2013-05-26 DIAGNOSIS — R609 Edema, unspecified: Secondary | ICD-10-CM

## 2013-05-26 DIAGNOSIS — Z87891 Personal history of nicotine dependence: Secondary | ICD-10-CM

## 2013-05-26 DIAGNOSIS — N039 Chronic nephritic syndrome with unspecified morphologic changes: Secondary | ICD-10-CM

## 2013-05-26 DIAGNOSIS — Z6834 Body mass index (BMI) 34.0-34.9, adult: Secondary | ICD-10-CM

## 2013-05-26 DIAGNOSIS — Z8249 Family history of ischemic heart disease and other diseases of the circulatory system: Secondary | ICD-10-CM

## 2013-05-26 DIAGNOSIS — Z833 Family history of diabetes mellitus: Secondary | ICD-10-CM

## 2013-05-26 DIAGNOSIS — I120 Hypertensive chronic kidney disease with stage 5 chronic kidney disease or end stage renal disease: Principal | ICD-10-CM | POA: Diagnosis present

## 2013-05-26 DIAGNOSIS — Z992 Dependence on renal dialysis: Secondary | ICD-10-CM

## 2013-05-26 DIAGNOSIS — I10 Essential (primary) hypertension: Secondary | ICD-10-CM

## 2013-05-26 DIAGNOSIS — N186 End stage renal disease: Secondary | ICD-10-CM

## 2013-05-26 LAB — CBC WITH DIFFERENTIAL/PLATELET
BASOS PCT: 0 % (ref 0–1)
Basophils Absolute: 0 10*3/uL (ref 0.0–0.1)
Basophils Absolute: 0 10*3/uL (ref 0.0–0.1)
Basophils Relative: 0 % (ref 0–1)
EOS ABS: 0.1 10*3/uL (ref 0.0–0.7)
EOS PCT: 2 % (ref 0–5)
Eosinophils Absolute: 0.2 10*3/uL (ref 0.0–0.7)
Eosinophils Relative: 1 % (ref 0–5)
HCT: 23.6 % — ABNORMAL LOW (ref 39.0–52.0)
HEMATOCRIT: 22.8 % — AB (ref 39.0–52.0)
Hemoglobin: 7.8 g/dL — CL (ref 13.0–17.0)
Hemoglobin: 7.5 g/dL — ABNORMAL LOW (ref 13.0–17.0)
Lymphocytes Relative: 6 % — ABNORMAL LOW (ref 12–46)
Lymphocytes Relative: 12 % (ref 12–46)
Lymphs Abs: 0.5 10*3/uL — ABNORMAL LOW (ref 0.7–4.0)
Lymphs Abs: 1 10*3/uL (ref 0.7–4.0)
MCH: 26.6 pg (ref 26.0–34.0)
MCH: 26.9 pg (ref 26.0–34.0)
MCHC: 33.1 g/dL (ref 30.0–36.0)
MCHC: 32.9 g/dL (ref 30.0–36.0)
MCV: 80.5 fL (ref 78.0–100.0)
MCV: 81.7 fL (ref 78.0–100.0)
MONO ABS: 1 10*3/uL (ref 0.1–1.0)
MONOS PCT: 10 % (ref 3–12)
MONOS PCT: 12 % (ref 3–12)
Monocytes Absolute: 0.9 10*3/uL (ref 0.1–1.0)
NEUTROS ABS: 6.3 10*3/uL (ref 1.7–7.7)
NEUTROS PCT: 83 % — AB (ref 43–77)
Neutro Abs: 7.1 10*3/uL (ref 1.7–7.7)
Neutrophils Relative %: 74 % (ref 43–77)
PLATELETS: 250 10*3/uL (ref 150–400)
Platelets: 228 10*3/uL (ref 150–400)
RBC: 2.93 MIL/uL — ABNORMAL LOW (ref 4.22–5.81)
RBC: 2.79 MIL/uL — ABNORMAL LOW (ref 4.22–5.81)
RDW: 17.1 % — ABNORMAL HIGH (ref 11.5–15.5)
RDW: 15.3 % (ref 11.5–15.5)
WBC: 8.5 10*3/uL (ref 4.0–10.5)
WBC: 8.5 10*3/uL (ref 4.0–10.5)

## 2013-05-26 LAB — URINALYSIS, ROUTINE W REFLEX MICROSCOPIC
Bilirubin Urine: NEGATIVE
GLUCOSE, UA: NEGATIVE mg/dL
Ketones, ur: NEGATIVE mg/dL
LEUKOCYTES UA: NEGATIVE
Nitrite: NEGATIVE
Protein, ur: >300 mg/dL — AB
SPECIFIC GRAVITY, URINE: 1.016 (ref 1.005–1.030)
Urobilinogen, UA: 0.2 mg/dL (ref 0.0–1.0)
pH: 6 (ref 5.0–8.0)

## 2013-05-26 LAB — COMPREHENSIVE METABOLIC PANEL
ALT: 24 U/L (ref 0–53)
ALT: 22 U/L (ref 0–53)
AST: 39 U/L — ABNORMAL HIGH (ref 0–37)
AST: 37 U/L (ref 0–37)
Albumin: 3.3 g/dL — ABNORMAL LOW (ref 3.5–5.2)
Albumin: 2.9 g/dL — ABNORMAL LOW (ref 3.5–5.2)
Alkaline Phosphatase: 64 U/L (ref 39–117)
Alkaline Phosphatase: 68 U/L (ref 39–117)
BUN: 169 mg/dL — ABNORMAL HIGH (ref 6–23)
BUN: 150 mg/dL — AB (ref 6–23)
CO2: 13 mEq/L — ABNORMAL LOW (ref 19–32)
CO2: 16 mEq/L — ABNORMAL LOW (ref 19–32)
CREATININE: 15.58 mg/dL — AB (ref 0.50–1.35)
CREATININE: 16.26 mg/dL — AB (ref 0.50–1.35)
Calcium: 5 mg/dL — CL (ref 8.4–10.5)
Calcium: 5.6 mg/dL — CL (ref 8.4–10.5)
Chloride: 104 mEq/L (ref 96–112)
Chloride: 100 mEq/L (ref 96–112)
GFR calc Af Amer: 3 mL/min — ABNORMAL LOW (ref >90)
GFR calc non Af Amer: 3 mL/min — ABNORMAL LOW (ref >90)
Glucose, Bld: 112 mg/dL — ABNORMAL HIGH (ref 70–99)
Glucose, Bld: 102 mg/dL — ABNORMAL HIGH (ref 70–99)
Potassium: 5.8 mEq/L — ABNORMAL HIGH (ref 3.5–5.3)
Potassium: 5.5 mEq/L — ABNORMAL HIGH (ref 3.7–5.3)
SODIUM: 140 meq/L (ref 135–145)
Sodium: 143 mEq/L (ref 137–147)
TOTAL PROTEIN: 6 g/dL (ref 6.0–8.3)
TOTAL PROTEIN: 6.5 g/dL (ref 6.0–8.3)
Total Bilirubin: 0.3 mg/dL (ref 0.2–1.2)
Total Bilirubin: <0.2 mg/dL — ABNORMAL LOW (ref 0.3–1.2)

## 2013-05-26 LAB — LIPID PANEL
CHOL/HDL RATIO: 3.5 ratio
Cholesterol: 183 mg/dL (ref 0–200)
HDL: 52 mg/dL (ref >39)
LDL CALC: 105 mg/dL — AB (ref 0–99)
TRIGLYCERIDES: 130 mg/dL (ref <150)
VLDL: 26 mg/dL (ref 0–40)

## 2013-05-26 LAB — CBG MONITORING, ED: GLUCOSE-CAPILLARY: 81 mg/dL (ref 70–99)

## 2013-05-26 LAB — URINE MICROSCOPIC-ADD ON

## 2013-05-26 LAB — HEMOGLOBIN A1C, FINGERSTICK: Hgb A1C (fingerstick): 6.1 % — ABNORMAL HIGH (ref <5.7)

## 2013-05-26 MED ORDER — INSULIN GLARGINE 100 UNIT/ML SOLOSTAR PEN: 30.0000 [IU] | PEN_INJECTOR | Freq: Every day | SUBCUTANEOUS | Status: DC

## 2013-05-26 MED ORDER — SODIUM CHLORIDE 0.9 % IJ SOLN
3.0000 mL | INTRAMUSCULAR | Status: DC | PRN
Start: 2013-05-26 — End: 2013-06-02

## 2013-05-26 MED ORDER — ONDANSETRON HCL 4 MG/2ML IJ SOLN
4.0000 mg | Freq: Four times a day (QID) | INTRAMUSCULAR | Status: DC | PRN
  Administered 2013-05-29 – 2013-06-01 (×9): 4 mg via INTRAVENOUS
  Filled 2013-05-26 (×8): qty 2

## 2013-05-26 MED ORDER — HYDROMORPHONE HCL PF 1 MG/ML IJ SOLN
0.5000 mg | INTRAMUSCULAR | Status: DC | PRN
  Administered 2013-05-30 – 2013-06-01 (×7): 1 mg via INTRAVENOUS
  Administered 2013-06-01: 0.5 mg via INTRAVENOUS
  Filled 2013-05-26 (×6): qty 1

## 2013-05-26 MED ORDER — SODIUM CHLORIDE 0.9 % IJ SOLN
3.0000 mL | Freq: Two times a day (BID) | INTRAMUSCULAR | Status: DC
  Administered 2013-05-29 – 2013-05-30 (×2): 3 mL via INTRAVENOUS

## 2013-05-26 MED ORDER — INSULIN ASPART 100 UNIT/ML ~~LOC~~ SOLN: 0.0000 [IU] | SUBCUTANEOUS | Status: DC

## 2013-05-26 MED ORDER — SIMVASTATIN 20 MG PO TABS
20.0000 mg | ORAL_TABLET | Freq: Every day | ORAL | Status: DC
  Administered 2013-05-27 – 2013-06-01 (×5): 20 mg via ORAL
  Filled 2013-05-26 (×7): qty 1

## 2013-05-26 MED ORDER — OXYCODONE HCL 5 MG PO TABS
5.0000 mg | ORAL_TABLET | ORAL | Status: DC | PRN
  Administered 2013-05-27: 5 mg via ORAL
  Filled 2013-05-26: qty 1

## 2013-05-26 MED ORDER — SODIUM CHLORIDE 0.9 % IV SOLN
250.0000 mL | INTRAVENOUS | Status: DC | PRN
  Administered 2013-05-30: 09:00:00 via INTRAVENOUS

## 2013-05-26 MED ORDER — INSULIN GLARGINE 100 UNIT/ML ~~LOC~~ SOLN
30.0000 [IU] | Freq: Every day | SUBCUTANEOUS | Status: DC
  Filled 2013-05-26 (×3): qty 0.3

## 2013-05-26 MED ORDER — METOPROLOL SUCCINATE ER 100 MG PO TB24
100.0000 mg | ORAL_TABLET | Freq: Every day | ORAL | Status: DC
  Filled 2013-05-26: qty 1

## 2013-05-26 MED ORDER — FUROSEMIDE 40 MG PO TABS
80.0000 mg | ORAL_TABLET | Freq: Two times a day (BID) | ORAL | Status: DC
Start: 2013-05-26 — End: 2013-06-02

## 2013-05-26 MED ORDER — SODIUM CHLORIDE 0.9 % IJ SOLN
3.0000 mL | Freq: Two times a day (BID) | INTRAMUSCULAR | Status: DC
  Administered 2013-05-27 – 2013-06-01 (×8): 3 mL via INTRAVENOUS

## 2013-05-26 MED ORDER — ONDANSETRON HCL 4 MG PO TABS: 4.0000 mg | ORAL_TABLET | Freq: Four times a day (QID) | ORAL | Status: DC | PRN

## 2013-05-26 MED ORDER — ENOXAPARIN SODIUM 40 MG/0.4ML ~~LOC~~ SOLN
40.0000 mg | SUBCUTANEOUS | Status: DC
  Administered 2013-05-27: 40 mg via SUBCUTANEOUS
  Filled 2013-05-26 (×2): qty 0.4

## 2013-05-26 MED ORDER — SODIUM POLYSTYRENE SULFONATE 15 GM/60ML PO SUSP
30.0000 g | Freq: Once | ORAL | Status: AC
  Administered 2013-05-27: 30 g via ORAL
  Filled 2013-05-26: qty 120

## 2013-05-26 MED ORDER — ACETAMINOPHEN 325 MG PO TABS
650.0000 mg | ORAL_TABLET | Freq: Four times a day (QID) | ORAL | Status: DC | PRN
  Administered 2013-05-27: 650 mg via ORAL

## 2013-05-26 MED ORDER — ACETAMINOPHEN 650 MG RE SUPP: 650.0000 mg | Freq: Four times a day (QID) | RECTAL | Status: DC | PRN

## 2013-05-26 MED ORDER — NIFEDIPINE ER OSMOTIC RELEASE 90 MG PO TB24
90.0000 mg | ORAL_TABLET | Freq: Every day | ORAL | Status: DC
  Filled 2013-05-26: qty 1

## 2013-05-26 NOTE — ED Notes (Addendum)
IV attempted by ultrasound and catheter placed in artery as noted by bright red colored blood and pulsing return.  Catheter removed and pressure held at site for 15 minutes per Dr linker order.  IV placement attempt was inside of Pt's left upper arm

## 2013-05-26 NOTE — Assessment & Plan Note (Signed)
Blood pressure looks okay on current medications

## 2013-05-26 NOTE — Patient Instructions (Signed)
Pneumonia vaccine given Lasix increased to 80mg  twice a day  Get the chest xray Referral to [place] [facility] Continue all other meds F/U 1 week for fluid

## 2013-05-26 NOTE — ED Notes (Signed)
Pt given permission to have a sandwich from [facility] by ED PA

## 2013-05-26 NOTE — ED Notes (Signed)
NF aware of lab values

## 2013-05-26 NOTE — ED Notes (Signed)
The pt has had a low urine output for awhile

## 2013-05-26 NOTE — ED Provider Notes (Signed)
CSN: [id number]     Arrival date & time 05/26/13  1726 History   First MD Initiated Contact with Patient 05/26/13 1930     Chief Complaint  Patient presents with  . Shortness of Breath     (Consider location/radiation/quality/duration/timing/severity/associated sxs/prior Treatment) HPI Patient presents to the emergency department with renal failure.  The patient, states he's had increased, swelling in his lower extremities, along with shortness breath, over the last couple weeks.  Patient, states several years ago.  He was told that he had renal failure, but declined dialysis.  Patient, states, that he felt that they told him he would die soon.  He didn't figure treatment would help the patient, states, that he's not had any chest pain, nausea, vomiting, diarrhea, abdominal pain, weakness, dizziness, fever, cough, back pain, or syncope Past Medical History  Diagnosis Date  . Diabetes mellitus without complication   . Hypertension   . Hyperlipidemia   . Gout   . Cancer     prostate / Declines treatment  . Chronic kidney disease     ESRD- Declines Dialysis   Past Surgical History  Procedure Laterality Date  . Right knee  1985   Family History  Problem Relation Age of Onset  . Diabetes Mother   . Heart disease Mother   . Cancer Father   . Heart disease Brother    History  Substance Use Topics  . Smoking status: Former Research scientist (life sciences)  . Smokeless tobacco: Never Used     Comment: quit 25 years prior  . Alcohol Use: No     Comment: rare    Review of Systems All other systems negative except as documented in the HPI. All pertinent positives and negatives as reviewed in the HPI.  Allergies  Review of patient's allergies indicates no known allergies.  Home Medications   Current Outpatient Rx  Name  Route  Sig  Dispense  Refill  . furosemide (LASIX) 40 MG tablet   Oral   Take 2 tablets (80 mg total) by mouth 2 (two) times daily.   120 tablet   3     Dose change   . Insulin  Glargine (LANTUS SOLOSTAR) 100 UNIT/ML Solostar Pen   Subcutaneous   Inject 30 Units into the skin at bedtime.   10 pen   3   . metoprolol succinate (TOPROL XL) 100 MG 24 hr tablet   Oral   Take 1 tablet (100 mg total) by mouth daily. Take with or immediately following a meal.   90 tablet   2   . pravastatin (PRAVACHOL) 40 MG tablet   Oral   Take 1 tablet (40 mg total) by mouth daily.   90 tablet   2   . NIFEdipine (ADALAT CC) 90 MG 24 hr tablet   Oral   Take 1 tablet (90 mg total) by mouth daily.   90 tablet   2    BP 168/81  Pulse 78  Temp(Src) 98.2 F (36.8 C)  Resp 14  Ht 6\' 3"  (1.905 m)  Wt 276 lb (125.193 kg)  BMI 34.50 kg/m2  SpO2 100% Physical Exam  Nursing note and vitals reviewed. Constitutional: He is oriented to person, place, and time. He appears well-developed and well-nourished. No distress.  HENT:  Head: Normocephalic and atraumatic.  Mouth/Throat: Oropharynx is clear and moist.  Eyes: Pupils are equal, round, and reactive to light.  Neck: Normal range of motion. Neck supple.  Cardiovascular: Normal rate, regular rhythm and normal heart  sounds.  Exam reveals no gallop and no friction rub.   No murmur heard. Pulmonary/Chest: Effort normal and breath sounds normal. No respiratory distress. He has no wheezes. He has no rales.  Musculoskeletal: He exhibits edema.  Neurological: He is alert and oriented to person, place, and time. He exhibits normal muscle tone. Coordination normal.  Skin: Skin is warm and dry. No rash noted. No erythema.    ED Course  Procedures (including critical care time) Labs Review Labs Reviewed  CBC WITH DIFFERENTIAL - Abnormal; Notable for the following:    RBC 2.79 (*)    Hemoglobin 7.5 (*)    HCT 22.8 (*)    All other components within normal limits  COMPREHENSIVE METABOLIC PANEL - Abnormal; Notable for the following:    Potassium 5.5 (*)    CO2 16 (*)    Glucose, Bld 102 (*)    BUN 150 (*)    Creatinine, Ser 16.26  (*)    Calcium 5.6 (*)    Albumin 2.9 (*)    Total Bilirubin <0.2 (*)    GFR calc non Af Amer 3 (*)    GFR calc Af Amer 3 (*)    All other components within normal limits  URINALYSIS, ROUTINE W REFLEX MICROSCOPIC - Abnormal; Notable for the following:    Hgb urine dipstick MODERATE (*)    Protein, ur >300 (*)    All other components within normal limits  URINE MICROSCOPIC-ADD ON - Abnormal; Notable for the following:    Casts HYALINE CASTS (*)    All other components within normal limits  CBG MONITORING, ED   Imaging Review Dg Chest 2 View  05/26/2013   CLINICAL DATA:  Shortness of breath, end-stage renal disease, and prostate cancer.  EXAM: CHEST  2 VIEW  COMPARISON:  06/01/2005  FINDINGS: The cardiac silhouette is upper limits of normal in size. There is mildly increased elevation the right hemidiaphragm. There is question of a new, small right pleural effusion. There is a well-circumscribed density in the posterior lung base on the lateral image which partially obscures the right hemidiaphragm. The left lung is clear. No pneumothorax is identified.  IMPRESSION: 1. Possible small right pleural effusion. 2. Well circumscribed opacity in the posterior right lung base, query Bochdalek's hernia.   Electronically Signed   By: Logan Bores   On: 05/26/2013 14:24   I spoke with hospitalist and nephrology.  The patient will be admitted to the hospital for further care and evaluation.  MDM   Final diagnoses:  CKD (chronic kidney disease), stage V  Hyperkalemia  Anemia of chronic renal failure  Diabetes mellitus with complication  Essential hypertension, benign  Hyperlipidemia  Gout  Obesity  Peripheral edema  Prostate cancer        Brent General, PA-C 05/26/13 2202

## 2013-05-26 NOTE — Progress Notes (Signed)
Patient ID: Bob Maldonado, male   DOB: Jun 08, 1955, 58 y.o.   MRN: [id number]   Subjective:    Patient ID: Bob Maldonado , male    DOB: [birth year], 58 y.o.   MRN: [id number]  Patient presents for BLE edema and Exhaustion  patient here secondary to increased fatigue over the past 6 months as well as worsening lower extremity edema over the past few weeks. He is currently taking Lasix 40 mg twice a day. He has known end-stage renal disease but has declined dialysis multiple times in the past. He was seen by [facility] last in May 2014 because of the information he received he states that he was put off by this and decided that nothing can help him in that he would not pursue any treatment. Because he has not pursued any treatment for his kidney disease he has untreated prostate cancer as nothing can be done with his creatinine. His last creatinine was around 8 and this was about 7 months ago. He's also had some shortness of breath over the past few months 2. He walks up a set of stairs he finds that he is more short winded than normal. He has been using an albuterol inhaler from his wife but this has not helped. He denies any abdominal pain or any chest pain. He is still making urine. Diabetes mellitus he's not been checking his blood sugars but has been injecting Lantus 30 units every night. His last A1c showed good control about 7 months ago.    Review Of Systems:  GEN- denies fatigue, fever, weight loss,weakness, recent illness HEENT- denies eye drainage, change in vision, nasal discharge, CVS- denies chest pain, palpitations RESP- + SOB, cough, wheeze ABD- denies N/V, change in stools, abd pain GU- denies dysuria, hematuria, dribbling, incontinence MSK- denies joint pain, muscle aches, injury Neuro- denies headache, dizziness, syncope, seizure activity       Objective:    BP 148/70  Pulse 78  Temp(Src) 98 F (36.7 C) (Oral)  Resp 18  Ht 6\' 3"  (1.905 m)  Wt 276  lb (125.193 kg)  BMI 34.50 kg/m2 GEN- NAD, alert and oriented x3 HEENT- PERRL, EOMI, non injected sclera, pink conjunctiva, MMM, oropharynx clear Neck- Supple,no JVD CVS- RRR, soft systolic murmur 2/6 RESP- bilateral crackles, no wheeze, normal WOB at rest ABD-NABS,soft,NT,ND EXT- 2+ pitting edema to shins Pulses- Radial 2+, DP- decreased Bilat        Assessment & Plan:      Problem List Items Addressed This Visit   Prostate cancer     PSA is pending he is untreated prostate cancer unknown stage    Relevant Orders      PSA (Completed)   Peripheral edema     Initially before I saw stat labs I advised him to increase his Lasix to 80 mg twice a day but now has worsening renal function and the sequela of this he has been sent to the hospital for admission and dialysis    Essential hypertension, benign     Blood pressure looks okay on current medications    Relevant Medications      furosemide (LASIX) tablet   Diabetes mellitus with complication     123456 is at 6.1 I think it has improved secondary to his worsening renal status. We will need to adjust his insulin this can be done during his admission    Relevant Orders      CBC with Differential (Completed)  Comprehensive metabolic panel (Completed)      Lipid panel (Completed)      Hemoglobin A1C, fingerstick (Completed)   CKD (chronic kidney disease), stage V     Stat labs were obtained his creatinine is up to 15 now and his leg swelling and a small pleural effusion he has some signs of acidosis as well he needs urgent evaluation in the emergency room as well as dialysis      Other Visit Diagnoses   SOB (shortness of breath)    -  Primary    Relevant Orders       DG Chest 2 View (Completed)       Brain natriuretic peptide (Completed)    Need for prophylactic vaccination against Streptococcus pneumoniae (pneumococcus)        Relevant Orders       Pneumococcal polysaccharide vaccine 23-valent greater than or equal to  2yo subcutaneous/IM (Completed)       Note: This dictation was prepared with Dragon dictation along with smaller phrase technology. Any transcriptional errors that result from this process are unintentional.

## 2013-05-26 NOTE — Assessment & Plan Note (Signed)
A1c is at 6.1 I think it has improved secondary to his worsening renal status. We will need to adjust his insulin this can be done during his admission

## 2013-05-26 NOTE — ED Notes (Signed)
He reports that he was sent here for dialysis new onset

## 2013-05-26 NOTE — Assessment & Plan Note (Signed)
PSA is pending he is untreated prostate cancer unknown stage

## 2013-05-26 NOTE — Assessment & Plan Note (Signed)
Initially before I saw stat labs I advised him to increase his Lasix to 80 mg twice a day but now has worsening renal function and the sequela of this he has been sent to the hospital for admission and dialysis

## 2013-05-26 NOTE — ED Notes (Signed)
The pt has had intermittent sob with edema over his body for 1-2 months.  He was seen at a doctors office and  Sent here today for treatment

## 2013-05-26 NOTE — H&P (Signed)
[facility] History and Physical  Bob Maldonado [id number] DOB: Jul 06, 1955 DOA: 05/26/2013  Referring physician:  EDP PCP: Vic Blackbird, MD  Specialists:   Chief Complaint:   SOB and Swelling of Both Legs  HPI: Bob Maldonado is a 58 y.o. male with a history Untreated CKD Stge V and Prostate Cancer since 03/2012, and DM2, HTN, who presents to the ED after he had labs performed which returned with abnormalities.  He has been having worsening SOB and edema of his BLEs over the past month.   He denies Chest Pain.       Review of Systems:  Constitutional: No Weight Loss, No Weight Gain, Night Sweats, Fevers, Chills, Fatigue, or Generalized Weakness HEENT: No Headaches, Difficulty Swallowing,Tooth/Dental Problems,Sore Throat,  No Sneezing, Rhinitis, Ear Ache, Nasal Congestion, or Post Nasal Drip,  Cardio-vascular:  No Chest pain, Orthopnea, PND,+Edema in lower extremities, Anasarca, Dizziness, Palpitations  Resp: +Dyspnea, +DOE, No Cough, No Hemoptysis,  No Wheezing.    GI: No Heartburn, Indigestion, Abdominal Pain, Nausea, Vomiting, Diarrhea, Change in Bowel Habits,  Loss of Appetite  GU: No Dysuria, Change in Color of Urine, No Urgency or Frequency.  No flank pain.  Musculoskeletal: No Joint Pain or Swelling.  No Decreased Range of Motion. No Back Pain.  Neurologic: No Syncope, No Seizures, Muscle Weakness, Paresthesia, Vision Disturbance or Loss, No Diplopia, No Vertigo, No Difficulty Walking,  Skin: No Rash or Lesions. Psych: No Change in Mood or Affect. No Depression or Anxiety. No Memory loss. No Confusion or Hallucinations   Past Medical History  Diagnosis Date  . Diabetes mellitus without complication   . Hypertension   . Hyperlipidemia   . Gout   . Cancer     prostate / Declines treatment  . Chronic kidney disease     ESRD- Declines Dialysis      Past Surgical History  Procedure Laterality Date  . Right knee  1985       Prior to Admission  medications   Medication Sig Start Date End Date Taking? Authorizing Provider  furosemide (LASIX) 40 MG tablet Take 2 tablets (80 mg total) by mouth 2 (two) times daily. 05/26/13  Yes Alycia Rossetti, MD  Insulin Glargine (LANTUS SOLOSTAR) 100 UNIT/ML Solostar Pen Inject 30 Units into the skin at bedtime. 04/18/13  Yes Alycia Rossetti, MD  metoprolol succinate (TOPROL XL) 100 MG 24 hr tablet Take 1 tablet (100 mg total) by mouth daily. Take with or immediately following a meal. 07/12/12 07/12/13 Yes Alycia Rossetti, MD  pravastatin (PRAVACHOL) 40 MG tablet Take 1 tablet (40 mg total) by mouth daily. 07/12/12  Yes Alycia Rossetti, MD  NIFEdipine (ADALAT CC) 90 MG 24 hr tablet Take 1 tablet (90 mg total) by mouth daily. 05/25/13   Alycia Rossetti, MD      No Known Allergies   Social History:  reports that he has quit smoking. He has never used smokeless tobacco. He reports that he does not drink alcohol or use illicit drugs.     Family History  Problem Relation Age of Onset  . Diabetes Mother   . Heart disease Mother   . Cancer Father   . Heart disease Brother        Physical Exam:  GEN:  Pleasant Morbidly Obese 58 y.o. African American male  examined  and in no acute distress; cooperative with exam Filed Vitals:   05/26/13 1744 05/26/13 1935 05/26/13 2004 05/26/13 2107  BP: 180/79 182/85  174/84 168/81  Pulse: 77 78    Temp: 98.2 F (36.8 C)     Resp: 18 8 14    Height: 6\' 3"  (1.905 m)     Weight: 125.193 kg (276 lb)     SpO2: 98% 100% 99% 100%   Blood pressure 168/81, pulse 78, temperature 98.2 F (36.8 C), resp. rate 14, height 6\' 3"  (1.905 m), weight 125.193 kg (276 lb), SpO2 100.00%. PSYCH: He is alert and oriented x4; does not appear anxious does not appear depressed; affect is normal HEENT: Normocephalic and Atraumatic, Mucous membranes pink; PERRLA; EOM intact; Fundi:  Benign;  No scleral icterus, Nares: Patent, Oropharynx: Clear, Fair Dentition, Neck:  FROM, no cervical  lymphadenopathy nor thyromegaly or carotid bruit; no JVD; Breasts:: Not examined CHEST WALL: No tenderness CHEST: Normal respiration, clear to auscultation bilaterally HEART: Regular rate and rhythm; no murmurs rubs or gallops BACK: No kyphosis or scoliosis; no CVA tenderness ABDOMEN: Positive Bowel Sounds, Obese, soft non-tender; no masses, no organomegaly, no pannus; no intertriginous candida. Rectal Exam: Not done EXTREMITIES: No cyanosis, clubbing,   +EDEMA 2+ to BLEs; no ulcerations. Genitalia: not examined PULSES: 2+ and symmetric SKIN: Normal hydration no rash or ulceration CNS: Alert and Oriented x 4,  No Focal Deficits.    Vascular: pulses palpable throughout    Labs on Admission:  Basic Metabolic Panel:  Recent Labs Lab 05/26/13 1114 05/26/13 1750  NA 140 143  K 5.8* 5.5*  CL 104 100  CO2 13* 16*  GLUCOSE 112* 102*  BUN 169* 150*  CREATININE 15.58* 16.26*  CALCIUM 5.0* 5.6*   Liver Function Tests:  Recent Labs Lab 05/26/13 1114 05/26/13 1750  AST 39* 37  ALT 24 22  ALKPHOS 64 68  BILITOT 0.3 <0.2*  PROT 6.0 6.5  ALBUMIN 3.3* 2.9*   No results found for this basename: LIPASE, AMYLASE,  in the last 168 hours No results found for this basename: AMMONIA,  in the last 168 hours CBC:  Recent Labs Lab 05/26/13 1114 05/26/13 1750  WBC 8.5 8.5  NEUTROABS 7.1 6.3  HGB 7.8* 7.5*  HCT 23.6* 22.8*  MCV 80.5 81.7  PLT 250 228   Cardiac Enzymes: No results found for this basename: CKTOTAL, CKMB, CKMBINDEX, TROPONINI,  in the last 168 hours  BNP (last 3 results) No results found for this basename: PROBNP,  in the last 8760 hours CBG:  Recent Labs Lab 05/26/13 1847  GLUCAP 81    Radiological Exams on Admission: Dg Chest 2 View  05/26/2013   CLINICAL DATA:  Shortness of breath, end-stage renal disease, and prostate cancer.  EXAM: CHEST  2 VIEW  COMPARISON:  06/01/2005  FINDINGS: The cardiac silhouette is upper limits of normal in size. There is  mildly increased elevation the right hemidiaphragm. There is question of a new, small right pleural effusion. There is a well-circumscribed density in the posterior lung base on the lateral image which partially obscures the right hemidiaphragm. The left lung is clear. No pneumothorax is identified.  IMPRESSION: 1. Possible small right pleural effusion. 2. Well circumscribed opacity in the posterior right lung base, query Bochdalek's hernia.   Electronically Signed   By: Logan Bores   On: 05/26/2013 14:24      EKG: Independently reviewed.     Assessment/Plan:   58 y.o. male with  Principal Problem:   CKD (chronic kidney disease), stage V Active Problems:   Hyperkalemia   Peripheral edema   Anemia of chronic renal failure  Diabetes mellitus with complication   Essential hypertension, benign   Obesity   Prostate cancer   Hyperlipidemia   Gout    SOB    1.   CKD Stage V-   Untreated, but now desires treatment,   Notify  Renal for initiation of dialysis and catheter placement.        2.   Hyperkalemia- due to #1,  Kayexalate 30 grams PO x 1.      3.   SOB and Peripheral Edema due to #1, and Fluid Retention.    Monitor O2 Sats, Rx will be with Dialysis.     4.   Anemia of chronic Disease and Renal Disease-   Send Anemia Panel.      5.   Untreated Prostate Cancer-   Schedule further workup as outpatient with Oncology and or Urology.     6.   DM2-  On Lantus Insulin and SSI coverage PRN and check HbA1c in AM.     7.   Essential HTN-  Continue Nifedipine, Toprol, add Clonidine ).1 mg PO q6 Hrs PRN SBP > 170.     8.   Hyperlipidemia-  Continue Statin Rx.    9.   Gout-  Stable.    10.  DVT Prophylaxis with Lovenox.       Code Status:  FULL CODE  Family Communication:  No Family Present   Disposition Plan:         Time spent:  [facility] Pager [phone]  If 7PM-7AM, please contact night-coverage [email or web address] Password  [email or web address] 05/26/2013, 9:12 PM

## 2013-05-26 NOTE — Assessment & Plan Note (Addendum)
Stat labs were obtained during the visit as well as CXR-  his creatinine is up to 15 now and his leg swelling and a small pleural effusion he has some signs of acidosis as well he needs urgent evaluation in the emergency room as well as dialysis.  I had multiple discussions with Bob Maldonado in the past regarding his renal failure and what can happen he has decided to hold off any treatment until he came in today with his wife.

## 2013-05-26 NOTE — ED Provider Notes (Signed)
Medical screening examination/treatment/procedure(s) were performed by non-physician practitioner and as supervising physician I was immediately available for consultation/collaboration.   EKG Interpretation None       Threasa Beards, MD 05/26/13 2204

## 2013-05-26 NOTE — Telephone Encounter (Signed)
Appt scheduled, pt has untreated ESRD and prostate cancer

## 2013-05-26 NOTE — ED Notes (Signed)
Pt attempted IV site reassessed and noted to have no redness or swelling to area of inside left upper arm.

## 2013-05-27 DIAGNOSIS — N186 End stage renal disease: Secondary | ICD-10-CM

## 2013-05-27 LAB — VITAMIN B12: Vitamin B-12: 392 pg/mL (ref 211–911)

## 2013-05-27 LAB — BASIC METABOLIC PANEL
BUN: 151 mg/dL — ABNORMAL HIGH (ref 6–23)
CO2: 15 mEq/L — ABNORMAL LOW (ref 19–32)
CREATININE: 16.2 mg/dL — AB (ref 0.50–1.35)
Calcium: 5.5 mg/dL — CL (ref 8.4–10.5)
Chloride: 102 mEq/L (ref 96–112)
GFR calc Af Amer: 3 mL/min — ABNORMAL LOW (ref >90)
GFR calc non Af Amer: 3 mL/min — ABNORMAL LOW (ref >90)
GLUCOSE: 57 mg/dL — AB (ref 70–99)
Potassium: 4.8 mEq/L (ref 3.7–5.3)
Sodium: 145 mEq/L (ref 137–147)

## 2013-05-27 LAB — GLUCOSE, CAPILLARY
GLUCOSE-CAPILLARY: 102 mg/dL — AB (ref 70–99)
GLUCOSE-CAPILLARY: 72 mg/dL (ref 70–99)
Glucose-Capillary: 85 mg/dL (ref 70–99)
Glucose-Capillary: 45 mg/dL — ABNORMAL LOW (ref 70–99)
Glucose-Capillary: 71 mg/dL (ref 70–99)
Glucose-Capillary: 63 mg/dL — ABNORMAL LOW (ref 70–99)
Glucose-Capillary: 110 mg/dL — ABNORMAL HIGH (ref 70–99)

## 2013-05-27 LAB — CBC
HCT: 23.6 % — ABNORMAL LOW (ref 39.0–52.0)
Hemoglobin: 7.8 g/dL — ABNORMAL LOW (ref 13.0–17.0)
MCH: 27.1 pg (ref 26.0–34.0)
MCHC: 33.1 g/dL (ref 30.0–36.0)
MCV: 81.9 fL (ref 78.0–100.0)
PLATELETS: 222 10*3/uL (ref 150–400)
RBC: 2.88 MIL/uL — AB (ref 4.22–5.81)
RDW: 15.5 % (ref 11.5–15.5)
WBC: 7.3 10*3/uL (ref 4.0–10.5)

## 2013-05-27 LAB — RETICULOCYTES
RBC.: 2.75 MIL/uL — AB (ref 4.22–5.81)
RETIC COUNT ABSOLUTE: 55 10*3/uL (ref 19.0–186.0)
RETIC CT PCT: 2 % (ref 0.4–3.1)

## 2013-05-27 LAB — IRON AND TIBC
IRON: 56 ug/dL (ref 42–135)
Saturation Ratios: 28 % (ref 20–55)
TIBC: 202 ug/dL — ABNORMAL LOW (ref 215–435)
UIBC: 146 ug/dL (ref 125–400)

## 2013-05-27 LAB — HEMOGLOBIN A1C
Hgb A1c MFr Bld: 5.8 % — ABNORMAL HIGH (ref <5.7)
Mean Plasma Glucose: 120 mg/dL — ABNORMAL HIGH (ref <117)

## 2013-05-27 LAB — FOLATE: Folate: 9 ng/mL

## 2013-05-27 LAB — FERRITIN: FERRITIN: 341 ng/mL — AB (ref 22–322)

## 2013-05-27 LAB — PHOSPHORUS: Phosphorus: 12.3 mg/dL — ABNORMAL HIGH (ref 2.3–4.6)

## 2013-05-27 LAB — PSA: PSA: 17.69 ng/mL — ABNORMAL HIGH (ref <=4.00)

## 2013-05-27 MED ORDER — INSULIN GLARGINE 100 UNIT/ML ~~LOC~~ SOLN
20.0000 [IU] | Freq: Every day | SUBCUTANEOUS | Status: DC
  Administered 2013-05-27 – 2013-05-29 (×3): 20 [IU] via SUBCUTANEOUS
  Filled 2013-05-27 (×4): qty 0.2

## 2013-05-27 MED ORDER — HEPARIN SODIUM (PORCINE) 5000 UNIT/ML IJ SOLN
5000.0000 [IU] | Freq: Three times a day (TID) | INTRAMUSCULAR | Status: DC
  Administered 2013-05-27 – 2013-06-02 (×16): 5000 [IU] via SUBCUTANEOUS
  Filled 2013-05-27 (×24): qty 1

## 2013-05-27 MED ORDER — DOXERCALCIFEROL 4 MCG/2ML IV SOLN
INTRAVENOUS | Status: AC
  Filled 2013-05-27: qty 2

## 2013-05-27 MED ORDER — NIFEDIPINE ER OSMOTIC RELEASE 90 MG PO TB24
90.0000 mg | ORAL_TABLET | Freq: Every day | ORAL | Status: DC
  Administered 2013-05-27 – 2013-06-02 (×6): 90 mg via ORAL
  Filled 2013-05-27 (×8): qty 1

## 2013-05-27 MED ORDER — METOPROLOL TARTRATE 1 MG/ML IV SOLN
5.0000 mg | Freq: Once | INTRAVENOUS | Status: AC
  Administered 2013-05-27: 5 mg via INTRAVENOUS
  Filled 2013-05-27: qty 5

## 2013-05-27 MED ORDER — DEXTROSE 50 % IV SOLN
INTRAVENOUS | Status: AC
  Administered 2013-05-27 (×2): 25 mL
  Filled 2013-05-27: qty 50

## 2013-05-27 MED ORDER — SODIUM POLYSTYRENE SULFONATE 15 GM/60ML PO SUSP
30.0000 g | Freq: Once | ORAL | Status: AC
  Administered 2013-05-27: 30 g via ORAL
  Filled 2013-05-27: qty 120

## 2013-05-27 MED ORDER — LORAZEPAM 2 MG/ML IJ SOLN
1.0000 mg | Freq: Once | INTRAMUSCULAR | Status: AC
  Administered 2013-05-27: 1 mg via INTRAVENOUS

## 2013-05-27 MED ORDER — INSULIN ASPART 100 UNIT/ML ~~LOC~~ SOLN
0.0000 [IU] | Freq: Three times a day (TID) | SUBCUTANEOUS | Status: DC
  Administered 2013-05-28 – 2013-05-31 (×3): 1 [IU] via SUBCUTANEOUS
  Administered 2013-05-31: 3 [IU] via SUBCUTANEOUS
  Administered 2013-06-01: 1 [IU] via SUBCUTANEOUS
  Administered 2013-06-01: 2 [IU] via SUBCUTANEOUS
  Administered 2013-06-02: 1 [IU] via SUBCUTANEOUS

## 2013-05-27 MED ORDER — LORAZEPAM 2 MG/ML IJ SOLN
INTRAMUSCULAR | Status: AC
  Administered 2013-05-27: 1 mg via INTRAVENOUS
  Filled 2013-05-27: qty 1

## 2013-05-27 MED ORDER — DOXERCALCIFEROL 4 MCG/2ML IV SOLN
2.0000 ug | Freq: Once | INTRAVENOUS | Status: AC
  Administered 2013-05-27: 2 ug via INTRAVENOUS
  Filled 2013-05-27: qty 2

## 2013-05-27 MED ORDER — HYDRALAZINE HCL 20 MG/ML IJ SOLN
10.0000 mg | INTRAMUSCULAR | Status: DC | PRN
  Administered 2013-05-27 – 2013-05-29 (×5): 10 mg via INTRAVENOUS
  Filled 2013-05-27 (×6): qty 1

## 2013-05-27 MED ORDER — ACETAMINOPHEN 325 MG PO TABS
ORAL_TABLET | ORAL | Status: AC
  Filled 2013-05-27: qty 2

## 2013-05-27 MED ORDER — SODIUM CHLORIDE 0.9 % IV SOLN
1.0000 g | Freq: Once | INTRAVENOUS | Status: AC
  Administered 2013-05-27 – 2013-05-28 (×2): 1 g via INTRAVENOUS
  Filled 2013-05-27: qty 10

## 2013-05-27 MED ORDER — DOXERCALCIFEROL 4 MCG/2ML IV SOLN: 2.0000 ug | INTRAVENOUS | Status: DC

## 2013-05-27 MED ORDER — METOPROLOL SUCCINATE ER 100 MG PO TB24
100.0000 mg | ORAL_TABLET | Freq: Every day | ORAL | Status: DC
  Administered 2013-05-27 – 2013-06-02 (×7): 100 mg via ORAL
  Filled 2013-05-27 (×9): qty 1

## 2013-05-27 MED ORDER — HYDRALAZINE HCL 20 MG/ML IJ SOLN
10.0000 mg | Freq: Once | INTRAMUSCULAR | Status: AC
  Administered 2013-05-27: 10 mg via INTRAVENOUS
  Filled 2013-05-27: qty 1

## 2013-05-27 NOTE — Consult Note (Signed)
  Vascular Surgery Consultation  Reason for Consult: Needs hemodialysis catheter and vascular access-dialysis initiated today through right femoral catheter  HPI: Bob Maldonado is a 58 y.o. male who presents for evaluation of tunneled catheter and permanent access. Patient has diabetes hypertension and hyperlipidemia and has refused dialysis consideration in past. Currently on hemodialysis the right femoral catheter.   Past Medical History  Diagnosis Date  . Diabetes mellitus without complication   . Hypertension   . Hyperlipidemia   . Gout   . Cancer     prostate / Declines treatment  . Chronic kidney disease     ESRD- Declines Dialysis   Past Surgical History  Procedure Laterality Date  . Right knee  1985   History   Social History  . Marital Status: Single    Spouse Name: N/A    Number of Children: N/A  . Years of Education: N/A   Social History Main Topics  . Smoking status: Former Research scientist (life sciences)  . Smokeless tobacco: Never Used     Comment: quit 25 years prior  . Alcohol Use: No     Comment: rare  . Drug Use: No  . Sexual Activity: Yes   Other Topics Concern  . None   Social History Narrative  . None   Family History  Problem Relation Age of Onset  . Diabetes Mother   . Heart disease Mother   . Cancer Father   . Heart disease Brother    No Known Allergies Prior to Admission medications   Medication Sig Start Date End Date Taking? Authorizing Provider  furosemide (LASIX) 40 MG tablet Take 2 tablets (80 mg total) by mouth 2 (two) times daily. 05/26/13  Yes Alycia Rossetti, MD  Insulin Glargine (LANTUS SOLOSTAR) 100 UNIT/ML Solostar Pen Inject 30 Units into the skin at bedtime. 04/18/13  Yes Alycia Rossetti, MD  metoprolol succinate (TOPROL XL) 100 MG 24 hr tablet Take 1 tablet (100 mg total) by mouth daily. Take with or immediately following a meal. 07/12/12 07/12/13 Yes Alycia Rossetti, MD  pravastatin (PRAVACHOL) 40 MG tablet Take 1 tablet (40 mg total) by  mouth daily. 07/12/12  Yes Alycia Rossetti, MD  NIFEdipine (ADALAT CC) 90 MG 24 hr tablet Take 1 tablet (90 mg total) by mouth daily. 05/25/13   Alycia Rossetti, MD     Positive ROS  All other systems have been reviewed and were otherwise negative with the exception of those mentioned in the HPI and as above.  Physical Exam: Filed Vitals:   05/27/13 1500  BP: 175/106  Pulse: 74  Temp:   Resp:     General: Alert, no acute distress HEENT: Normal for age Cardiovascular: Regular rate and rhythm. Carotid pulses 2+, no bruits audible Respiratory: Clear to auscultation. No cyanosis, no use of accessory musculature GI: No organomegaly, abdomen is soft and non-tender Skin: No lesions in the area of chief complaint Neurologic: Sensation intact distally Psychiatric: Patient is competent for consent with normal mood and affect Musculoskeletal: No obvious deformities Extremities 2+ brachial and radial pulses bilaterally.    Imaging reviewed: Vein mapping not done yet   Assessment/Plan:  Will schedule for insertion of tunneled dialysis catheter and possible left arm AV fistula on Tuesday by Dr. Adele Barthel Will order vein mapping bilateral upper extremities   Tinnie Gens, MD 05/27/2013 3:15 PM

## 2013-05-27 NOTE — Progress Notes (Signed)
New Admission Note:  Arrival Method: Via bed with nurse Tech Mental Orientation: Alert and oriented     Telemetry: Placed on box 18, notified CCMD Assessment: Completed Pain: Denies any pain Safety Measures: Safety Fall Prevention Plan was given, discussed and signed. Admission: initiated [place] Orientation: Patient has been orientated to the room, unit and the staff. Family: Significant other and children at bedside  Orders have been reviewed and implemented. Will continue to monitor the patient. Call light has been placed within reach and bed alarm has been activated.   Leandro Reasoner BSN, RN  Phone Number: [phone] [facility]

## 2013-05-27 NOTE — Progress Notes (Signed)
Blood pressure equals 185/95, notified NP on call. 10 mg of hydralazine given. Will continue to monitor.

## 2013-05-27 NOTE — Procedures (Signed)
I was present at this dialysis session, have reviewed the session itself and made  appropriate changes  Kelly Splinter MD (pgr) [phone]    ([phone] 05/27/2013, 3:28 PM

## 2013-05-27 NOTE — Consult Note (Signed)
Renal Service Consult Note [facility]  Bob Maldonado 05/27/2013 Sol Blazing Requesting Physician:  Dr Candiss Norse  Reason for Consult:  CKD V, uremia HPI: The patient is a 58 y.o. year-old with hx of DM on insulin, CKD, HTN, gout and prostate cancer.  He has known CKD but in the past has always refused to consider dialysis.  He now presents with somnolence.  PCP drew labs with very high creatinine and sent to ED.  Creatinine here is 15.58.  Creat last May was 6 and last September was 8.  He reports fatigue, poor appetite, no N/V/D, denies SOB, cough or confusion.  +Sleepiness.     ROS  no jt pain  no skin rash  no CP  R handed  lives in [place]  wife is present  Past Medical History  Past Medical History  Diagnosis Date  . Diabetes mellitus without complication   . Hypertension   . Hyperlipidemia   . Gout   . Cancer     prostate / Declines treatment  . Chronic kidney disease     ESRD- Declines Dialysis   Past Surgical History  Past Surgical History  Procedure Laterality Date  . Right knee  1985   Family History  Family History  Problem Relation Age of Onset  . Diabetes Mother   . Heart disease Mother   . Cancer Father   . Heart disease Brother    Social History  reports that he has quit smoking. He has never used smokeless tobacco. He reports that he does not drink alcohol or use illicit drugs. Allergies No Known Allergies Home medications Prior to Admission medications   Medication Sig Start Date End Date Taking? Authorizing Provider  furosemide (LASIX) 40 MG tablet Take 2 tablets (80 mg total) by mouth 2 (two) times daily. 05/26/13  Yes Alycia Rossetti, MD  Insulin Glargine (LANTUS SOLOSTAR) 100 UNIT/ML Solostar Pen Inject 30 Units into the skin at bedtime. 04/18/13  Yes Alycia Rossetti, MD  metoprolol succinate (TOPROL XL) 100 MG 24 hr tablet Take 1 tablet (100 mg total) by mouth daily. Take with or immediately following a meal. 07/12/12 07/12/13  Yes Alycia Rossetti, MD  pravastatin (PRAVACHOL) 40 MG tablet Take 1 tablet (40 mg total) by mouth daily. 07/12/12  Yes Alycia Rossetti, MD  NIFEdipine (ADALAT CC) 90 MG 24 hr tablet Take 1 tablet (90 mg total) by mouth daily. 05/25/13   Alycia Rossetti, MD   Liver Function Tests  Recent Labs Lab 05/26/13 1114 05/26/13 1750  AST 39* 37  ALT 24 22  ALKPHOS 64 68  BILITOT 0.3 <0.2*  PROT 6.0 6.5  ALBUMIN 3.3* 2.9*   No results found for this basename: LIPASE, AMYLASE,  in the last 168 hours CBC  Recent Labs Lab 05/26/13 1114 05/26/13 1750 05/27/13 0642  WBC 8.5 8.5 7.3  NEUTROABS 7.1 6.3  --   HGB 7.8* 7.5* 7.8*  HCT 23.6* 22.8* 23.6*  MCV 80.5 81.7 81.9  PLT 250 228 [id number]   Basic Metabolic Panel  Recent Labs Lab 05/26/13 1114 05/26/13 1750 05/27/13 0642  NA 140 143 145  K 5.8* 5.5* 4.8  CL 104 100 102  CO2 13* 16* 15*  GLUCOSE 112* 102* 57*  BUN 169* 150* 151*  CREATININE 15.58* 16.26* 16.20*  CALCIUM 5.0* 5.6* 5.5*    Filed Vitals:   05/27/13 0411 05/27/13 0656 05/27/13 0757 05/27/13 1149  BP: 186/79 193/94 168/75 199/101  Pulse:  74 70 72 74  Temp: 98 F (36.7 C)  98.4 F (36.9 C) 98.5 F (36.9 C)  TempSrc: Oral  Oral Oral  Resp: 17  18 15   Height:      Weight:      SpO2: 100%  99% 98%   Exam Lethargic obese AA adult male, no distress No rash, cyanosis or gangrene Sclera anicteric, throat clear +JVD no bruits Chest is clear bilat, no rales or wheezing RRR no MRG Abd obese, NTND, no mass or HSM Bilateral LE edema 2-3+, no wounds Neuro no asterixis, somnolent, Ox3 and nf  UA 3-6 rbc, rare bact, >300 prot CXR essentially clear BUN 169, Cr 15.5 (last Cr 8.68 Sept 2014)  Assessment: 1 Advanced stage V CKD- needs dialysis 2 Uremia / somnolence 3 Volume overload / LE edema 4 DM longstanding on insulin 5 HTN- on MTP and procardia 6 HL 7 Gout 8 Prostate cancer- history of, declined Rx 9 Anemia of CKD- get Fe studies 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 Decrease volume with HD. Will follow.    Kelly Splinter MD (pgr) [phone]    ([phone] 05/27/2013, 1:36 PM

## 2013-05-27 NOTE — Progress Notes (Signed)
Blood pressure equals 193/94 after metoprolol, MD present at bedside. MD ordered blood pressure medications to be given earlier and prn hydralazine. Passed information to morning RN.

## 2013-05-27 NOTE — Progress Notes (Signed)
Blood pressure during morning vital signs equals 186/79, NP on call notified. 5 mg of metoprolol given IV per order. Will continue to monitor.

## 2013-05-27 NOTE — Progress Notes (Addendum)
Patient Demographics  Bob Maldonado, is a 58 y.o. male, DOB - 1955/03/27, [id number]  Admit date - 05/26/2013   Admitting Physician Theressa Millard, MD  Outpatient Primary MD for the patient is Bob Blackbird, MD  LOS - 1   Chief Complaint  Patient presents with  . Shortness of Breath        Assessment & Plan    1. CKD Stage V- Untreated, but now desires treatment, called Renal for initiation of dialysis and catheter placement.    2. Hyperkalemia- due to #1, Kayexalate repeated, renal consulted for dialysis.    3. SOB and Peripheral Edema due to #1, and Fluid Retention. Not requiring oxygen and symptom-free at rest, will require dialysis.    4. Anemia of chronic Disease and Renal Disease- renal.    5. Untreated Prostate Cancer- Schedule further workup as outpatient with Oncology and or Urology.    6. DM2- A1c noted, morning CBG was low reduce Lantus, continue q. a.c. sliding scale  Lab Results  Component Value Date   HGBA1C 6.1* 05/26/2013    CBG (last 3)   Recent Labs  05/27/13 0007 05/27/13 0408 05/27/13 0756  GLUCAP 102* 85 45*     7. Essential HTN- Continue Nifedipine, Toprol, add Clonidine ).1 mg PO q6 Hrs PRN SBP > 170.    8. Hyperlipidemia- Continue Statin Rx.    9. Gout- Stable.    10. Hypocalcemia. Replaced and monitor ionized calcium     Code Status: Full  Family Communication: None present  Disposition Plan: Home   Procedures will need placement of dialysis catheter   Consults  renal   Medications  Scheduled Meds: . enoxaparin (LOVENOX) injection  40 mg Subcutaneous Q24H  . insulin aspart  0-9 Units Subcutaneous 6 times per day  . insulin glargine  30 Units Subcutaneous QHS  . metoprolol succinate  100 mg Oral Daily  .  NIFEdipine  90 mg Oral Daily  . simvastatin  20 mg Oral q1800  . sodium chloride  3 mL Intravenous Q12H  . sodium chloride  3 mL Intravenous Q12H   Continuous Infusions:  PRN Meds:.sodium chloride, acetaminophen, acetaminophen, hydrALAZINE, HYDROmorphone (DILAUDID) injection, ondansetron (ZOFRAN) IV, ondansetron, oxyCODONE, sodium chloride  DVT Prophylaxis    Heparin    Lab Results  Component Value Date   PLT 222 05/27/2013    Antibiotics     Anti-infectives   None          Subjective:   [name] today has, No headache, No chest pain, No abdominal pain - No Nausea, No new weakness tingling or numbness, No Cough - SOB.    Objective:   Filed Vitals:   05/27/13 0001 05/27/13 0411 05/27/13 0656 05/27/13 0757  BP: 190/91 186/79 193/94 168/75  Pulse: 75 74 70 72  Temp:  98 F (36.7 C)  98.4 F (36.9 C)  TempSrc:  Oral  Oral  Resp:  17  18  Height:      Weight:      SpO2:  100%  99%    Wt Readings from Last 3 Encounters:  05/26/13 122.653 kg (270 lb 6.4 oz)  05/26/13 125.193 kg (276 lb)  04/18/13 127.007 kg (280 lb)  Intake/Output Summary (Last 24 hours) at 05/27/13 0922 Last data filed at 05/27/13 0147  Gross per 24 hour  Intake      3 ml  Output      0 ml  Net      3 ml     Physical Exam  Awake Alert, Oriented X 3, No new F.N deficits, Normal affect Phillips.AT,PERRAL Supple Neck,No JVD, No cervical lymphadenopathy appriciated.  Symmetrical Chest wall movement, Good air movement bilaterally, few rales RRR,No Gallops,Rubs or new Murmurs, No Parasternal Heave +ve B.Sounds, Abd Soft, Non tender, No organomegaly appriciated, No rebound - guarding or rigidity. No Cyanosis, Clubbing or edema, No new Rash or bruise, 1+ edema      Data Review   Micro Results No results found for this or any previous visit (from the past 240 hour(s)).  Radiology Reports Dg Chest 2 View  05/26/2013   CLINICAL DATA:  Shortness of breath, end-stage renal disease, and  prostate cancer.  EXAM: CHEST  2 VIEW  COMPARISON:  06/01/2005  FINDINGS: The cardiac silhouette is upper limits of normal in size. There is mildly increased elevation the right hemidiaphragm. There is question of a new, small right pleural effusion. There is a well-circumscribed density in the posterior lung base on the lateral image which partially obscures the right hemidiaphragm. The left lung is clear. No pneumothorax is identified.  IMPRESSION: 1. Possible small right pleural effusion. 2. Well circumscribed opacity in the posterior right lung base, query Bochdalek's hernia.   Electronically Signed   By: Logan Bores   On: 05/26/2013 14:24    CBC  Recent Labs Lab 05/26/13 1114 05/26/13 1750 05/27/13 0642  WBC 8.5 8.5 7.3  HGB 7.8* 7.5* 7.8*  HCT 23.6* 22.8* 23.6*  PLT 250 228 222  MCV 80.5 81.7 81.9  MCH 26.6 26.9 27.1  MCHC 33.1 32.9 33.1  RDW 17.1* 15.3 15.5  LYMPHSABS 0.5* 1.0  --   MONOABS 0.9 1.0  --   EOSABS 0.1 0.2  --   BASOSABS 0.0 0.0  --     Chemistries   Recent Labs Lab 05/26/13 1114 05/26/13 1750 05/27/13 0642  NA 140 143 145  K 5.8* 5.5* 4.8  CL 104 100 102  CO2 13* 16* 15*  GLUCOSE 112* 102* 57*  BUN 169* 150* PENDING  CREATININE 15.58* 16.26* 16.20*  CALCIUM 5.0* 5.6* 5.5*  AST 39* 37  --   ALT 24 22  --   ALKPHOS 64 68  --   BILITOT 0.3 <0.2*  --    ------------------------------------------------------------------------------------------------------------------ estimated creatinine clearance is 6.9 ml/min (by C-G formula based on Cr of 16.2). ------------------------------------------------------------------------------------------------------------------  Recent Labs  05/26/13 1114  HGBA1C 6.1*   ------------------------------------------------------------------------------------------------------------------  Recent Labs  05/26/13 1114  CHOL 183  HDL 52  LDLCALC 105*  TRIG 130  CHOLHDL 3.5    ------------------------------------------------------------------------------------------------------------------ No results found for this basename: TSH, T4TOTAL, FREET3, T3FREE, THYROIDAB,  in the last 72 hours ------------------------------------------------------------------------------------------------------------------  Recent Labs  05/27/13 0642  RETICCTPCT 2.0    Coagulation profile No results found for this basename: INR, PROTIME,  in the last 168 hours  No results found for this basename: DDIMER,  in the last 72 hours  Cardiac Enzymes No results found for this basename: CK, CKMB, TROPONINI, MYOGLOBIN,  in the last 168 hours ------------------------------------------------------------------------------------------------------------------ No components found with this basename: POCBNP,      Time Spent in minutes   35   Thurnell Lose M.D on 05/27/2013 at  9:22 AM  Between 7am to 7pm - Pager - [phone]  After 7pm go to [email or web address] - password [email or web address]  And look for the night coverage person covering for me after hours  [facility] Office  [phone]

## 2013-05-28 DIAGNOSIS — Z01810 Encounter for preprocedural cardiovascular examination: Secondary | ICD-10-CM

## 2013-05-28 LAB — GLUCOSE, CAPILLARY
GLUCOSE-CAPILLARY: 129 mg/dL — AB (ref 70–99)
Glucose-Capillary: 111 mg/dL — ABNORMAL HIGH (ref 70–99)
Glucose-Capillary: 112 mg/dL — ABNORMAL HIGH (ref 70–99)
Glucose-Capillary: 139 mg/dL — ABNORMAL HIGH (ref 70–99)
Glucose-Capillary: 145 mg/dL — ABNORMAL HIGH (ref 70–99)

## 2013-05-28 LAB — HEPATITIS B CORE ANTIBODY, TOTAL: HEP B C TOTAL AB: NONREACTIVE

## 2013-05-28 LAB — HEPATITIS B SURFACE ANTIGEN: Hepatitis B Surface Ag: NEGATIVE

## 2013-05-28 LAB — CALCIUM, IONIZED: Calcium, Ion: 0.86 mmol/L — ABNORMAL LOW (ref 1.12–1.23)

## 2013-05-28 LAB — HEPATITIS B SURFACE ANTIBODY,QUALITATIVE: HEP B S AB: NEGATIVE

## 2013-05-28 LAB — HEPATITIS B CORE ANTIBODY, IGM: HEP B C IGM: NONREACTIVE

## 2013-05-28 MED ORDER — RENA-VITE PO TABS
1.0000 | ORAL_TABLET | Freq: Every day | ORAL | Status: DC
  Administered 2013-05-28: 1 via ORAL
  Administered 2013-05-29: 23:00:00 via ORAL
  Administered 2013-05-30 – 2013-06-01 (×3): 1 via ORAL
  Filled 2013-05-28 (×6): qty 1

## 2013-05-28 MED ORDER — DOXERCALCIFEROL 4 MCG/2ML IV SOLN
2.0000 ug | INTRAVENOUS | Status: DC
  Administered 2013-05-29: 2 ug via INTRAVENOUS
  Filled 2013-05-28 (×2): qty 2

## 2013-05-28 MED ORDER — DOXERCALCIFEROL 4 MCG/2ML IV SOLN
2.0000 ug | Freq: Once | INTRAVENOUS | Status: AC
  Administered 2013-06-01: 2 ug via INTRAVENOUS
  Filled 2013-05-28: qty 2

## 2013-05-28 MED ORDER — DARBEPOETIN ALFA-POLYSORBATE 40 MCG/0.4ML IJ SOLN
40.0000 ug | INTRAMUSCULAR | Status: DC
  Administered 2013-05-29: 40 ug via INTRAVENOUS
  Filled 2013-05-28: qty 0.4

## 2013-05-28 MED ORDER — SODIUM CHLORIDE 0.9 % IV SOLN
2.0000 g | Freq: Once | INTRAVENOUS | Status: DC
  Filled 2013-05-28: qty 20

## 2013-05-28 MED ORDER — HYDRALAZINE HCL 50 MG PO TABS
50.0000 mg | ORAL_TABLET | Freq: Three times a day (TID) | ORAL | Status: DC
  Administered 2013-05-28 – 2013-05-29 (×4): 50 mg via ORAL
  Filled 2013-05-28 (×7): qty 1

## 2013-05-28 MED ORDER — CALCIUM ACETATE 667 MG PO CAPS
1334.0000 mg | ORAL_CAPSULE | Freq: Three times a day (TID) | ORAL | Status: DC
  Administered 2013-05-28 – 2013-06-02 (×10): 1334 mg via ORAL
  Filled 2013-05-28 (×18): qty 2

## 2013-05-28 NOTE — Progress Notes (Signed)
Patient ID: Bob Maldonado, male   DOB: [birth year], 58 y.o.   MRN: [id number] Vein mapping to be performed today  Plan insertion of tunneled hemodialysis catheter and access-likely aVF left upper extremity on Tuesday per Dr. Bridgett Larsson  Please arrange dialysis for Monday so patient will be available for operating room on Tuesday

## 2013-05-28 NOTE — Progress Notes (Signed)
Patient and wife with many questions re: reason for high BP,nausea, hemodialysis vs CAPD vs CCPD, diet, dialysis accesses, etc.Spoke with patient and wife at length, encouraged to watch videos and continue to ask questions.Patient and wife,"feel a lot better informed," very appreciative.

## 2013-05-28 NOTE — Progress Notes (Signed)
Patient Demographics  Bob Maldonado, is a 58 y.o. male, DOB - 24-May-1955, [id number]  Admit date - 05/26/2013   Admitting Physician Theressa Millard, MD  Outpatient Primary MD for the patient is Vic Blackbird, MD  LOS - 2   Chief Complaint  Patient presents with  . Shortness of Breath        Assessment & Plan      1. ESRD - Untreated, but now desires treatment, renal onboard he underwent right femoral dialysis catheter placement by vascular surgery on 05/27/2013 and dialysis was initiated on the same date.    2. Hyperkalemia- due to #1, resolved after Kayexalate and dialysis     3. SOB and Peripheral Edema due to #1, and Fluid Retention. Not requiring oxygen and symptom-free at rest, continue dialysis.    4. Anemia of chronic Disease and Renal Disease- our goal to keep hemoglobin above 7.    5. Untreated Prostate Cancer- Schedule further workup as outpatient with Oncology and or Urology.     6. DM2- A1c noted, morning CBG was low reduce Lantus, continue q. a.c. sliding scale  Lab Results  Component Value Date   HGBA1C 5.8* 05/27/2013    CBG (last 3)   Recent Labs  05/28/13 0005 05/28/13 0418 05/28/13 0821  GLUCAP 145* 111* 112*      7. Essential HTN- Continue Nifedipine, Toprol, added scheduled hydralazine along with as needed  Clonidine, monitor blood pressure and adjust medications as needed.    8. Hyperlipidemia- Continue Statin Rx.     9. Gout- Stable.     10. Hypocalcemia due to untreated ESRD. Replaced IV again on 06/05/2013, placed on oral by Timothy supplementation by renal follow.      Code Status: Full  Family Communication: None present  Disposition Plan: Home   Procedures by vascular surgery on 05/27/2013 right femoral  dialysis catheter, hemodialysis initiated on 05-27-13   Consults  renal   Medications  Scheduled Meds: . calcium acetate  1,334 mg Oral TID WC  . calcium gluconate  2 g Intravenous Once  . doxercalciferol  2 mcg Intravenous Once  . [START ON 05/29/2013] doxercalciferol  2 mcg Intravenous Q M,W,F-HD  . heparin subcutaneous  5,000 Units Subcutaneous 3 times per day  . hydrALAZINE  50 mg Oral 3 times per day  . insulin aspart  0-9 Units Subcutaneous TID WC  . insulin glargine  20 Units Subcutaneous QHS  . metoprolol succinate  100 mg Oral Daily  . NIFEdipine  90 mg Oral Daily  . simvastatin  20 mg Oral q1800  . sodium chloride  3 mL Intravenous Q12H  . sodium chloride  3 mL Intravenous Q12H   Continuous Infusions:  PRN Meds:.sodium chloride, acetaminophen, hydrALAZINE, HYDROmorphone (DILAUDID) injection, ondansetron (ZOFRAN) IV, oxyCODONE, sodium chloride  DVT Prophylaxis    Heparin    Lab Results  Component Value Date   PLT 222 05/27/2013    Antibiotics     Anti-infectives   None          Subjective:   Bob Maldonado today has, No headache, No chest pain, No abdominal pain - No Nausea, No new weakness tingling or numbness, No Cough - SOB.    Objective:   Filed  Vitals:   05/28/13 0007 05/28/13 0420 05/28/13 0720 05/28/13 0823  BP: 194/100 210/99 175/82 192/84  Pulse: 89 75 84 86  Temp: 98.7 F (37.1 C) 98.9 F (37.2 C)  97.7 F (36.5 C)  TempSrc: Oral Oral  Oral  Resp: 20 20  19   Height:      Weight:      SpO2: 96% 98%  98%    Wt Readings from Last 3 Encounters:  05/27/13 120.4 kg (265 lb 6.9 oz)  05/26/13 125.193 kg (276 lb)  04/18/13 127.007 kg (280 lb)     Intake/Output Summary (Last 24 hours) at 05/28/13 0901 Last data filed at 05/28/13 0421  Gross per 24 hour  Intake   1030 ml  Output   2002 ml  Net   -972 ml     Physical Exam  Awake Alert, Oriented X 3, No new F.N deficits, Normal affect [place].AT,PERRAL Supple Neck,No JVD, No cervical  lymphadenopathy appriciated.  Symmetrical Chest wall movement, Good air movement bilaterally, few rales RRR,No Gallops,Rubs or new Murmurs, No Parasternal Heave +ve B.Sounds, Abd Soft, Non tender, No organomegaly appriciated, No rebound - guarding or rigidity. No Cyanosis, Clubbing or edema, No new Rash or bruise, 1+ edema      Data Review   Micro Results No results found for this or any previous visit (from the past 240 hour(s)).  Radiology Reports Dg Chest 2 View  05/26/2013   CLINICAL DATA:  Shortness of breath, end-stage renal disease, and prostate cancer.  EXAM: CHEST  2 VIEW  COMPARISON:  06/01/2005  FINDINGS: The cardiac silhouette is upper limits of normal in size. There is mildly increased elevation the right hemidiaphragm. There is question of a new, small right pleural effusion. There is a well-circumscribed density in the posterior lung base on the lateral image which partially obscures the right hemidiaphragm. The left lung is clear. No pneumothorax is identified.  IMPRESSION: 1. Possible small right pleural effusion. 2. Well circumscribed opacity in the posterior right lung base, query Bochdalek's hernia.   Electronically Signed   By: Logan Bores   On: 05/26/2013 14:24    CBC  Recent Labs Lab 05/26/13 1114 05/26/13 1750 05/27/13 0642  WBC 8.5 8.5 7.3  HGB 7.8* 7.5* 7.8*  HCT 23.6* 22.8* 23.6*  PLT 250 228 222  MCV 80.5 81.7 81.9  MCH 26.6 26.9 27.1  MCHC 33.1 32.9 33.1  RDW 17.1* 15.3 15.5  LYMPHSABS 0.5* 1.0  --   MONOABS 0.9 1.0  --   EOSABS 0.1 0.2  --   BASOSABS 0.0 0.0  --     Chemistries   Recent Labs Lab 05/26/13 1114 05/26/13 1750 05/27/13 0642  NA 140 143 145  K 5.8* 5.5* 4.8  CL 104 100 102  CO2 13* 16* 15*  GLUCOSE 112* 102* 57*  BUN 169* 150* 151*  CREATININE 15.58* 16.26* 16.20*  CALCIUM 5.0* 5.6* 5.5*  AST 39* 37  --   ALT 24 22  --   ALKPHOS 64 68  --   BILITOT 0.3 <0.2*  --     ------------------------------------------------------------------------------------------------------------------ estimated creatinine clearance is 6.9 ml/min (by C-G formula based on Cr of 16.2). ------------------------------------------------------------------------------------------------------------------  Recent Labs  05/26/13 1114 05/27/13 0642  HGBA1C 6.1* 5.8*   ------------------------------------------------------------------------------------------------------------------  Recent Labs  05/26/13 1114  CHOL 183  HDL 52  LDLCALC 105*  TRIG 130  CHOLHDL 3.5   ------------------------------------------------------------------------------------------------------------------ No results found for this basename: TSH, T4TOTAL, FREET3, T3FREE, THYROIDAB,  in  the last 72 hours ------------------------------------------------------------------------------------------------------------------  Recent Labs  05/27/13 0642  VITAMINB12 392  FOLATE 9.0  FERRITIN 341*  TIBC 202*  IRON 56  RETICCTPCT 2.0    Coagulation profile No results found for this basename: INR, PROTIME,  in the last 168 hours  No results found for this basename: DDIMER,  in the last 72 hours  Cardiac Enzymes No results found for this basename: CK, CKMB, TROPONINI, MYOGLOBIN,  in the last 168 hours ------------------------------------------------------------------------------------------------------------------ No components found with this basename: POCBNP,      Time Spent in minutes   35   Thurnell Lose M.D on 05/28/2013 at 9:01 AM  Between 7am to 7pm - Pager - [phone]  After 7pm go to [email or web address] - password [email or web address]  And look for the night coverage person covering for me after hours  [facility] Office  [phone]

## 2013-05-28 NOTE — Progress Notes (Signed)
CBG= 45. Patient asymptomatic. 1/2 amp D50 given. Repeat CBG=71.Patient eating breakfast.

## 2013-05-28 NOTE — Progress Notes (Signed)
Subjective:   Feeling better today, no sob. Tolerating diet without nausea. No complaints   Objective Filed Vitals:   05/28/13 0420 05/28/13 0720 05/28/13 0823 05/28/13 1142  BP: 210/99 175/82 192/84 203/96  Pulse: 75 84 86 86  Temp: 98.9 F (37.2 C)  97.7 F (36.5 C) 97.6 F (36.4 C)  TempSrc: Oral  Oral Oral  Resp: 20  19 19   Height:      Weight:      SpO2: 98%  98% 98%   Physical Exam General: Alert and oriented. No acute distress. Resting in bed Heart: RRR no murmur Lungs: CTA, unlabored Abdomen: soft, obese. nontender +BS Extremities: 2 + bilat LE edema Dialysis Access:  R fem cath (placed 4/11)  Assessment/Plan: 1. ESRD - untreated, refused HD in the past. HD initiated 4/11, HD again tomorrow.  CLIP tomorrow. 2. Dialysis access- Plan for tunneled cath and AVF placement tuesday 3. Anemia - hgb 7.8. Tsat 28 Start ESA monday 4. Secondary hyperparathyroidism - Ca+ 5.5. hectorol started, IV calcium. Phos 12.3 - started phoslo. Check PTH 5. HTN/volume - 175/82 On hydralazine, metrop and procardia. LE edema, continue volume removal with HD 6. Nutrition - alb 2.9. Renal diet. multivit 7. Prostate cancer- untreated. F/u at DC 8. DM- per primary- A1c 5.8  Shelle Iron, NP [place] [phone] 05/28/2013,12:54 PM  LOS: 2 days   Pt seen, examined and agree w A/P as above. Much more alert today.  Still nauseous which is probably from uremia, should improve with more HD.  HD tomorrow and Tuesday then prob can change to three times a week.  CLIP pending. Kelly Splinter MD pager [phone]    cell [phone] 05/28/2013, 2:27 PM    Additional Objective Labs: Basic Metabolic Panel:  Recent Labs Lab 05/26/13 1114 05/26/13 1750 05/27/13 0642  NA 140 143 145  K 5.8* 5.5* 4.8  CL 104 100 102  CO2 13* 16* 15*  GLUCOSE 112* 102* 57*  BUN 169* 150* 151*  CREATININE 15.58* 16.26* 16.20*  CALCIUM 5.0* 5.6* 5.5*  PHOS  --   --  12.3*   Liver Function  Tests:  Recent Labs Lab 05/26/13 1114 05/26/13 1750  AST 39* 37  ALT 24 22  ALKPHOS 64 68  BILITOT 0.3 <0.2*  PROT 6.0 6.5  ALBUMIN 3.3* 2.9*   No results found for this basename: LIPASE, AMYLASE,  in the last 168 hours CBC:  Recent Labs Lab 05/26/13 1114 05/26/13 1750 05/27/13 0642  WBC 8.5 8.5 7.3  NEUTROABS 7.1 6.3  --   HGB 7.8* 7.5* 7.8*  HCT 23.6* 22.8* 23.6*  MCV 80.5 81.7 81.9  PLT 250 228 222   Blood Culture No results found for this basename: sdes, specrequest, cult, reptstatus    Cardiac Enzymes: No results found for this basename: CKTOTAL, CKMB, CKMBINDEX, TROPONINI,  in the last 168 hours CBG:  Recent Labs Lab 05/27/13 2115 05/28/13 0005 05/28/13 0418 05/28/13 0821 05/28/13 1141  GLUCAP 110* 145* 111* 112* 139*   Iron Studies:  Recent Labs  05/27/13 0642  IRON 56  TIBC 202*  FERRITIN 341*   @[email or web address] @ Studies/Results: Dg Chest 2 View  05/26/2013   CLINICAL DATA:  Shortness of breath, end-stage renal disease, and prostate cancer.  EXAM: CHEST  2 VIEW  COMPARISON:  06/01/2005  FINDINGS: The cardiac silhouette is upper limits of normal in size. There is mildly increased elevation the right hemidiaphragm. There is question of a new, small right pleural effusion. There  is a well-circumscribed density in the posterior lung base on the lateral image which partially obscures the right hemidiaphragm. The left lung is clear. No pneumothorax is identified.  IMPRESSION: 1. Possible small right pleural effusion. 2. Well circumscribed opacity in the posterior right lung base, query Bochdalek's hernia.   Electronically Signed   By: Logan Bores   On: 05/26/2013 14:24   Medications:   . calcium acetate  1,334 mg Oral TID WC  . calcium gluconate  2 g Intravenous Once  . doxercalciferol  2 mcg Intravenous Once  . [START ON 05/29/2013] doxercalciferol  2 mcg Intravenous Q M,W,F-HD  . heparin subcutaneous  5,000 Units Subcutaneous 3 times per day  .  hydrALAZINE  50 mg Oral 3 times per day  . insulin aspart  0-9 Units Subcutaneous TID WC  . insulin glargine  20 Units Subcutaneous QHS  . metoprolol succinate  100 mg Oral Daily  . NIFEdipine  90 mg Oral Daily  . simvastatin  20 mg Oral q1800  . sodium chloride  3 mL Intravenous Q12H  . sodium chloride  3 mL Intravenous Q12H

## 2013-05-28 NOTE — Progress Notes (Signed)
Right  Upper Extremity Vein Map  This was a technically difficult study. Veins are hard to follow, many with thickened walls.     Cephalic  Segment Diameter Depth Comment  1. Axilla mm mm   2. Mid upper arm mm mm   3. Above AC 3.34mm mm   4. In [facility] 3.102mm mm   5. Below AC 2.72mm mm   6. Mid forearm mm mm   7. Wrist mm mm    mm mm    mm mm    mm mm    Basilic  Segment Diameter Depth Comment  1. Axilla 3.46mm 89mm   2. Mid upper arm 2.86mm 35mm   3. Above AC mm mm   4. In [facility] 3.98mm 7.12mm   5. Below AC mm mm thrombosis  6. Mid forearm mm mm   7. Wrist mm mm    mm mm    mm mm    mm mm   Left Upper Extremity Vein Map    Cephalic  Segment Diameter Depth Comment  1. Axilla mm mm   2. Mid upper arm 1.44mm mm   3. Above AC mm mm   4. In AC mm mm   5. Below AC mm mm   6. Mid forearm mm mm   7. Wrist mm mm    mm mm    mm mm    mm mm    Basilic  Segment Diameter Depth Comment  1. Axilla mm mm   2. Mid upper arm 3.51mm 61mm   3. Above AC 3.10mm 89mm   4. In AC mm mm   5. Below AC mm mm   6. Mid forearm 3.57mm 45mm   Brachial mm mm   Left AC 64mm 36mm   Mid left upper arm 5.62mm 33mm   Shoulder 6.52mm mm

## 2013-05-28 NOTE — Progress Notes (Signed)
CBG=63.Patient asymptomatic. 1/2 amp D50 given, lunch tray arrived, patient eating.

## 2013-05-29 LAB — PARATHYROID HORMONE, INTACT (NO CA): PTH: 505.9 pg/mL — AB (ref 14.0–72.0)

## 2013-05-29 LAB — GLUCOSE, CAPILLARY
GLUCOSE-CAPILLARY: 149 mg/dL — AB (ref 70–99)
Glucose-Capillary: 119 mg/dL — ABNORMAL HIGH (ref 70–99)
Glucose-Capillary: 99 mg/dL (ref 70–99)
Glucose-Capillary: 82 mg/dL (ref 70–99)
Glucose-Capillary: 84 mg/dL (ref 70–99)
Glucose-Capillary: 107 mg/dL — ABNORMAL HIGH (ref 70–99)
Glucose-Capillary: 109 mg/dL — ABNORMAL HIGH (ref 70–99)

## 2013-05-29 LAB — BRAIN NATRIURETIC PEPTIDE

## 2013-05-29 MED ORDER — HYDRALAZINE HCL 50 MG PO TABS
100.0000 mg | ORAL_TABLET | Freq: Three times a day (TID) | ORAL | Status: DC
  Administered 2013-05-29 – 2013-06-02 (×11): 100 mg via ORAL
  Filled 2013-05-29 (×16): qty 2

## 2013-05-29 MED ORDER — SODIUM CHLORIDE 0.9 % IV SOLN
2.0000 g | Freq: Once | INTRAVENOUS | Status: AC
  Administered 2013-05-29: 2 g via INTRAVENOUS
  Filled 2013-05-29: qty 20

## 2013-05-29 MED ORDER — DOXERCALCIFEROL 4 MCG/2ML IV SOLN
INTRAVENOUS | Status: AC
  Administered 2013-05-29: 2 ug via INTRAVENOUS
  Filled 2013-05-29: qty 2

## 2013-05-29 MED ORDER — CLONIDINE HCL 0.1 MG PO TABS
0.1000 mg | ORAL_TABLET | Freq: Three times a day (TID) | ORAL | Status: DC
  Administered 2013-05-29 – 2013-06-02 (×9): 0.1 mg via ORAL
  Filled 2013-05-29 (×14): qty 1

## 2013-05-29 MED ORDER — DARBEPOETIN ALFA-POLYSORBATE 40 MCG/0.4ML IJ SOLN
INTRAMUSCULAR | Status: AC
  Administered 2013-05-29: 40 ug via INTRAVENOUS
  Filled 2013-05-29: qty 0.4

## 2013-05-29 NOTE — Progress Notes (Signed)
Patient Demographics  Bob Maldonado, is a 58 y.o. male, DOB - 07-15-1955, [id number]  Admit date - 05/26/2013   Admitting Physician Theressa Millard, MD  Outpatient Primary MD for the patient is Vic Blackbird, MD  LOS - 3   Chief Complaint  Patient presents with  . Shortness of Breath        Assessment & Plan      1. ESRD - Untreated, but now desires treatment, renal onboard he underwent right femoral dialysis catheter placement by vascular surgery on 05/27/2013 and dialysis was initiated on the same date.    2. Hyperkalemia- due to #1, resolved after Kayexalate and dialysis     3. SOB and Peripheral Edema due to #1, and Fluid Retention. Not requiring oxygen and symptom-free at rest, continue dialysis.    4. Anemia of chronic Disease and Renal Disease- our goal to keep hemoglobin above 7.    5. Untreated Prostate Cancer- Schedule further workup as outpatient with Oncology and or Urology.     6. DM2- A1c noted, morning CBG was low reduce Lantus, continue q. a.c. sliding scale  Lab Results  Component Value Date   HGBA1C 5.8* 05/27/2013    CBG (last 3)   Recent Labs  05/29/13 05/29/13 0400 05/29/13 0739  GLUCAP 149* 99 82      7. Essential HTN- Continue Nifedipine, Toprol, will increase hydralazine dose and add scheduled clonidine for better control, monitor blood pressure and adjust medications as needed.    8. Hyperlipidemia- Continue Statin Rx.     9. Gout- Stable.     10. Hypocalcemia due to untreated ESRD. Replaced IV again on 05/29/2013, placed on oral by Timothy supplementation by renal follow.      Code Status: Full  Family Communication: None present  Disposition Plan: Home   Procedures by vascular surgery on 05/27/2013 right  femoral dialysis catheter, hemodialysis initiated on 05-27-13   Consults  renal vascular surgery   Medications  Scheduled Meds: . calcium acetate  1,334 mg Oral TID WC  . darbepoetin (ARANESP) injection - DIALYSIS  40 mcg Intravenous Q Mon-HD  . doxercalciferol  2 mcg Intravenous Once  . doxercalciferol  2 mcg Intravenous Q M,W,F-HD  . heparin subcutaneous  5,000 Units Subcutaneous 3 times per day  . hydrALAZINE  50 mg Oral 3 times per day  . insulin aspart  0-9 Units Subcutaneous TID WC  . insulin glargine  20 Units Subcutaneous QHS  . metoprolol succinate  100 mg Oral Daily  . multivitamin  1 tablet Oral QHS  . NIFEdipine  90 mg Oral Daily  . simvastatin  20 mg Oral q1800  . sodium chloride  3 mL Intravenous Q12H  . sodium chloride  3 mL Intravenous Q12H   Continuous Infusions:  PRN Meds:.sodium chloride, acetaminophen, hydrALAZINE, HYDROmorphone (DILAUDID) injection, ondansetron (ZOFRAN) IV, oxyCODONE, sodium chloride  DVT Prophylaxis    Heparin    Lab Results  Component Value Date   PLT 222 05/27/2013    Antibiotics     Anti-infectives   None          Subjective:   Bob Maldonado today has, No headache, No chest pain, No abdominal pain - No Nausea, No new weakness tingling or  numbness, No Cough - SOB.    Objective:   Filed Vitals:   05/28/13 2035 05/28/13 2330 05/29/13 0427 05/29/13 1000  BP: 196/96 184/79 171/74 181/74  Pulse: 94 83 85 84  Temp: 98.5 F (36.9 C) 98.5 F (36.9 C) 99.1 F (37.3 C) 99.1 F (37.3 C)  TempSrc: Oral Oral Oral Oral  Resp: 18  20 20   Height:      Weight:      SpO2: 96% 99% 97% 98%    Wt Readings from Last 3 Encounters:  05/27/13 120.4 kg (265 lb 6.9 oz)  05/26/13 125.193 kg (276 lb)  04/18/13 127.007 kg (280 lb)     Intake/Output Summary (Last 24 hours) at 05/29/13 1134 Last data filed at 05/29/13 1051  Gross per 24 hour  Intake   1370 ml  Output    950 ml  Net    420 ml     Physical Exam  Awake Alert,  Oriented X 3, No new F.N deficits, Normal affect [place].AT,PERRAL Supple Neck,No JVD, No cervical lymphadenopathy appriciated.  Symmetrical Chest wall movement, Good air movement bilaterally, few rales RRR,No Gallops,Rubs or new Murmurs, No Parasternal Heave +ve B.Sounds, Abd Soft, Non tender, No organomegaly appriciated, No rebound - guarding or rigidity. No Cyanosis, Clubbing or edema, No new Rash or bruise, 1+ edema      Data Review   Micro Results No results found for this or any previous visit (from the past 240 hour(s)).  Radiology Reports Dg Chest 2 View  05/26/2013   CLINICAL DATA:  Shortness of breath, end-stage renal disease, and prostate cancer.  EXAM: CHEST  2 VIEW  COMPARISON:  06/01/2005  FINDINGS: The cardiac silhouette is upper limits of normal in size. There is mildly increased elevation the right hemidiaphragm. There is question of a new, small right pleural effusion. There is a well-circumscribed density in the posterior lung base on the lateral image which partially obscures the right hemidiaphragm. The left lung is clear. No pneumothorax is identified.  IMPRESSION: 1. Possible small right pleural effusion. 2. Well circumscribed opacity in the posterior right lung base, query Bochdalek's hernia.   Electronically Signed   By: Logan Bores   On: 05/26/2013 14:24    CBC  Recent Labs Lab 05/26/13 1114 05/26/13 1750 05/27/13 0642  WBC 8.5 8.5 7.3  HGB 7.8* 7.5* 7.8*  HCT 23.6* 22.8* 23.6*  PLT 250 228 222  MCV 80.5 81.7 81.9  MCH 26.6 26.9 27.1  MCHC 33.1 32.9 33.1  RDW 17.1* 15.3 15.5  LYMPHSABS 0.5* 1.0  --   MONOABS 0.9 1.0  --   EOSABS 0.1 0.2  --   BASOSABS 0.0 0.0  --     Chemistries   Recent Labs Lab 05/26/13 1114 05/26/13 1750 05/27/13 0642  NA 140 143 145  K 5.8* 5.5* 4.8  CL 104 100 102  CO2 13* 16* 15*  GLUCOSE 112* 102* 57*  BUN 169* 150* 151*  CREATININE 15.58* 16.26* 16.20*  CALCIUM 5.0* 5.6* 5.5*  AST 39* 37  --   ALT 24 22  --     ALKPHOS 64 68  --   BILITOT 0.3 <0.2*  --    ------------------------------------------------------------------------------------------------------------------ estimated creatinine clearance is 6.9 ml/min (by C-G formula based on Cr of 16.2). ------------------------------------------------------------------------------------------------------------------  Recent Labs  05/27/13 0642  HGBA1C 5.8*   ------------------------------------------------------------------------------------------------------------------ No results found for this basename: CHOL, HDL, LDLCALC, TRIG, CHOLHDL, LDLDIRECT,  in the last 72 hours ------------------------------------------------------------------------------------------------------------------ No results found  for this basename: TSH, T4TOTAL, FREET3, T3FREE, THYROIDAB,  in the last 72 hours ------------------------------------------------------------------------------------------------------------------  Recent Labs  05/27/13 0642  VITAMINB12 392  FOLATE 9.0  FERRITIN 341*  TIBC 202*  IRON 56  RETICCTPCT 2.0    Coagulation profile No results found for this basename: INR, PROTIME,  in the last 168 hours  No results found for this basename: DDIMER,  in the last 72 hours  Cardiac Enzymes No results found for this basename: CK, CKMB, TROPONINI, MYOGLOBIN,  in the last 168 hours ------------------------------------------------------------------------------------------------------------------ No components found with this basename: POCBNP,      Time Spent in minutes   35   Thurnell Lose M.D on 05/29/2013 at 11:34 AM  Between 7am to 7pm - Pager - [phone]  After 7pm go to [email or web address] - password [email or web address]  And look for the night coverage person covering for me after hours  [facility] Office  [phone]

## 2013-05-29 NOTE — Progress Notes (Signed)
Utilization review completed.  

## 2013-05-29 NOTE — Progress Notes (Signed)
Admit: 05/26/2013 LOS: 3  71M new start ESRD / CKD-5HD  Subjective:  NAEON.   Still with persistent nausea, poor PO Plan for [facility] and perm access tomorrow   04/12 0701 - 04/13 0700 In: 1370 [P.O.:1200; I.V.:60] Out: 500 [Urine:500]  Filed Weights   05/26/13 2240 05/27/13 1410 05/27/13 1652  Weight: 122.653 kg (270 lb 6.4 oz) 122.6 kg (270 lb 4.5 oz) 120.4 kg (265 lb 6.9 oz)    Current meds: reviewed PhosLo 2qAC, Aranesp 40 qMon, Hectorol 2qTx Current Labs: reviewed    Physical Exam:  Blood pressure 181/74, pulse 84, temperature 99.1 F (37.3 C), temperature source Oral, resp. rate 20, height 6\' 3"  (1.905 m), weight 120.4 kg (265 lb 6.9 oz), SpO2 98.00%. NAD RRR, no rub Bibasilar crackles 2+ LEE Nonfocal, no asterixis S/NT/ND  Assessment 1. New ESRD (Start 4/11) 2. Anemia 3. 2HPTH 4. Hyperphosphatemia 5. HTN 6. Access 7. Prostate Cancer  Plan 1. HD #2 today, again tomorrow pending procedures 2. TDC and AVF tomorrow with VVS, appreciate 3. On ESA, monitor Hb 4. Cont binder, phos will improve with RRT  Pearson Grippe MD 05/29/2013, 3:17 PM   Recent Labs Lab 05/26/13 1114 05/26/13 1750 05/27/13 0642  NA 140 143 145  K 5.8* 5.5* 4.8  CL 104 100 102  CO2 13* 16* 15*  GLUCOSE 112* 102* 57*  BUN 169* 150* 151*  CREATININE 15.58* 16.26* 16.20*  CALCIUM 5.0* 5.6* 5.5*  PHOS  --   --  12.3*    Recent Labs Lab 05/26/13 1114 05/26/13 1750 05/27/13 0642  WBC 8.5 8.5 7.3  NEUTROABS 7.1 6.3  --   HGB 7.8* 7.5* 7.8*  HCT 23.6* 22.8* 23.6*  MCV 80.5 81.7 81.9  PLT 250 228 222

## 2013-05-30 ENCOUNTER — Inpatient Hospital Stay (HOSPITAL_COMMUNITY): Payer: BC Managed Care – PPO | Admitting: Certified Registered"

## 2013-05-30 ENCOUNTER — Inpatient Hospital Stay (HOSPITAL_COMMUNITY): Payer: BC Managed Care – PPO

## 2013-05-30 ENCOUNTER — Encounter (HOSPITAL_COMMUNITY): Payer: Self-pay | Admitting: Certified Registered Nurse Anesthetist

## 2013-05-30 ENCOUNTER — Encounter (HOSPITAL_COMMUNITY): Payer: BC Managed Care – PPO | Admitting: Certified Registered"

## 2013-05-30 ENCOUNTER — Other Ambulatory Visit: Payer: Self-pay | Admitting: *Deleted

## 2013-05-30 ENCOUNTER — Encounter (HOSPITAL_COMMUNITY)
Admission: EM | Disposition: A | Discharge status: Home Health | Payer: Self-pay | Source: Home / Self Care | Attending: Internal Medicine

## 2013-05-30 DIAGNOSIS — Z4931 Encounter for adequacy testing for hemodialysis: Secondary | ICD-10-CM

## 2013-05-30 DIAGNOSIS — N184 Chronic kidney disease, stage 4 (severe): Secondary | ICD-10-CM

## 2013-05-30 DIAGNOSIS — N186 End stage renal disease: Secondary | ICD-10-CM

## 2013-05-30 HISTORY — PX: AV FISTULA PLACEMENT: SHX1204

## 2013-05-30 HISTORY — PX: INSERTION OF DIALYSIS CATHETER: SHX1324

## 2013-05-30 LAB — CBC
HEMATOCRIT: 22.1 % — AB (ref 39.0–52.0)
Hemoglobin: 7 g/dL — ABNORMAL LOW (ref 13.0–17.0)
MCH: 26.6 pg (ref 26.0–34.0)
MCHC: 31.7 g/dL (ref 30.0–36.0)
MCV: 84 fL (ref 78.0–100.0)
Platelets: 172 10*3/uL (ref 150–400)
RBC: 2.63 MIL/uL — AB (ref 4.22–5.81)
RDW: 15.6 % — ABNORMAL HIGH (ref 11.5–15.5)
WBC: 8.8 10*3/uL (ref 4.0–10.5)

## 2013-05-30 LAB — PROTIME-INR
INR: 1.05 (ref 0.00–1.49)
Prothrombin Time: 13.5 seconds (ref 11.6–15.2)

## 2013-05-30 LAB — BASIC METABOLIC PANEL
BUN: 68 mg/dL — ABNORMAL HIGH (ref 6–23)
CHLORIDE: 99 meq/L (ref 96–112)
CO2: 23 meq/L (ref 19–32)
CREATININE: 10.35 mg/dL — AB (ref 0.50–1.35)
Calcium: 7.3 mg/dL — ABNORMAL LOW (ref 8.4–10.5)
GFR calc Af Amer: 6 mL/min — ABNORMAL LOW (ref >90)
GFR calc non Af Amer: 5 mL/min — ABNORMAL LOW (ref >90)
GLUCOSE: 76 mg/dL (ref 70–99)
Potassium: 3.9 mEq/L (ref 3.7–5.3)
SODIUM: 141 meq/L (ref 137–147)

## 2013-05-30 LAB — SURGICAL PCR SCREEN
MRSA, PCR: NEGATIVE
Staphylococcus aureus: NEGATIVE

## 2013-05-30 LAB — GLUCOSE, CAPILLARY
GLUCOSE-CAPILLARY: 102 mg/dL — AB (ref 70–99)
GLUCOSE-CAPILLARY: 131 mg/dL — AB (ref 70–99)
GLUCOSE-CAPILLARY: 79 mg/dL (ref 70–99)
Glucose-Capillary: 79 mg/dL (ref 70–99)
Glucose-Capillary: 77 mg/dL (ref 70–99)

## 2013-05-30 LAB — ABO/RH: ABO/RH(D): B POS

## 2013-05-30 SURGERY — INSERTION OF DIALYSIS CATHETER
Anesthesia: Monitor Anesthesia Care | Site: Chest | Laterality: Right

## 2013-05-30 MED ORDER — THROMBIN 20000 UNITS EX SOLR
CUTANEOUS | Status: AC
  Filled 2013-05-30: qty 20000

## 2013-05-30 MED ORDER — HEPARIN SODIUM (PORCINE) 1000 UNIT/ML IJ SOLN
INTRAMUSCULAR | Status: AC
  Filled 2013-05-30: qty 1

## 2013-05-30 MED ORDER — PROPOFOL 10 MG/ML IV BOLUS
INTRAVENOUS | Status: DC | PRN
  Administered 2013-05-30: 20 mg via INTRAVENOUS

## 2013-05-30 MED ORDER — SODIUM CHLORIDE 0.9 % IV SOLN: 100.0000 mL | INTRAVENOUS | Status: DC | PRN

## 2013-05-30 MED ORDER — FENTANYL CITRATE 0.05 MG/ML IJ SOLN: 25.0000 ug | INTRAMUSCULAR | Status: DC | PRN

## 2013-05-30 MED ORDER — MIDAZOLAM HCL 2 MG/2ML IJ SOLN
INTRAMUSCULAR | Status: AC
  Filled 2013-05-30: qty 2

## 2013-05-30 MED ORDER — NEPRO/CARBSTEADY PO LIQD: 237.0000 mL | ORAL | Status: DC | PRN

## 2013-05-30 MED ORDER — LIDOCAINE HCL (PF) 1 % IJ SOLN: 5.0000 mL | INTRAMUSCULAR | Status: DC | PRN

## 2013-05-30 MED ORDER — LIDOCAINE-PRILOCAINE 2.5-2.5 % EX CREA: 1.0000 "application " | TOPICAL_CREAM | CUTANEOUS | Status: DC | PRN

## 2013-05-30 MED ORDER — LIDOCAINE-EPINEPHRINE (PF) 1 %-1:200000 IJ SOLN
INTRAMUSCULAR | Status: DC | PRN
  Administered 2013-05-30: 12.25 mL

## 2013-05-30 MED ORDER — PENTAFLUOROPROP-TETRAFLUOROETH EX AERO: 1.0000 "application " | INHALATION_SPRAY | CUTANEOUS | Status: DC | PRN

## 2013-05-30 MED ORDER — SODIUM CHLORIDE 0.9 % IV SOLN
INTRAVENOUS | Status: DC
  Administered 2013-05-30: 09:00:00 via INTRAVENOUS

## 2013-05-30 MED ORDER — ONDANSETRON HCL 4 MG/2ML IJ SOLN
INTRAMUSCULAR | Status: DC | PRN
  Administered 2013-05-30: 4 mg via INTRAVENOUS

## 2013-05-30 MED ORDER — 0.9 % SODIUM CHLORIDE (POUR BTL) OPTIME
TOPICAL | Status: DC | PRN
  Administered 2013-05-30: 1000 mL

## 2013-05-30 MED ORDER — DARBEPOETIN ALFA-POLYSORBATE 100 MCG/0.5ML IJ SOLN: 100.0000 ug | INTRAMUSCULAR | Status: DC

## 2013-05-30 MED ORDER — HEPARIN SODIUM (PORCINE) 1000 UNIT/ML IJ SOLN
INTRAMUSCULAR | Status: DC | PRN
  Administered 2013-05-30: 4.6 mL

## 2013-05-30 MED ORDER — BUPIVACAINE HCL (PF) 0.5 % IJ SOLN
INTRAMUSCULAR | Status: DC | PRN
  Administered 2013-05-30: 12.25 mL

## 2013-05-30 MED ORDER — HEPARIN SODIUM (PORCINE) 1000 UNIT/ML DIALYSIS
2000.0000 [IU] | INTRAMUSCULAR | Status: DC | PRN
  Filled 2013-05-30: qty 2

## 2013-05-30 MED ORDER — PROPOFOL 10 MG/ML IV BOLUS
INTRAVENOUS | Status: AC
  Filled 2013-05-30: qty 20

## 2013-05-30 MED ORDER — OXYCODONE HCL 5 MG/5ML PO SOLN: 5.0000 mg | Freq: Once | ORAL | Status: DC | PRN

## 2013-05-30 MED ORDER — CEFAZOLIN SODIUM-DEXTROSE 2-3 GM-% IV SOLR
INTRAVENOUS | Status: DC | PRN
  Administered 2013-05-30: 2 g via INTRAVENOUS

## 2013-05-30 MED ORDER — OXYCODONE HCL 5 MG PO TABS: 5.0000 mg | ORAL_TABLET | Freq: Once | ORAL | Status: DC | PRN

## 2013-05-30 MED ORDER — PROPOFOL INFUSION 10 MG/ML OPTIME
INTRAVENOUS | Status: DC | PRN
  Administered 2013-05-30: 25 ug/kg/min via INTRAVENOUS

## 2013-05-30 MED ORDER — ARTIFICIAL TEARS OP OINT
TOPICAL_OINTMENT | OPHTHALMIC | Status: AC
  Filled 2013-05-30: qty 3.5

## 2013-05-30 MED ORDER — SODIUM CHLORIDE 0.9 % IR SOLN
Status: DC | PRN
  Administered 2013-05-30 (×2)

## 2013-05-30 MED ORDER — ONDANSETRON HCL 4 MG/2ML IJ SOLN: 4.0000 mg | Freq: Once | INTRAMUSCULAR | Status: DC | PRN

## 2013-05-30 MED ORDER — KIDNEY FAILURE BOOK
Freq: Once | Status: AC
  Administered 2013-05-31: 12:00:00
  Filled 2013-05-30: qty 1

## 2013-05-30 MED ORDER — ALTEPLASE 2 MG IJ SOLR
2.0000 mg | Freq: Once | INTRAMUSCULAR | Status: DC | PRN
  Filled 2013-05-30: qty 2

## 2013-05-30 MED ORDER — FENTANYL CITRATE 0.05 MG/ML IJ SOLN
INTRAMUSCULAR | Status: AC
  Filled 2013-05-30: qty 5

## 2013-05-30 MED ORDER — HEPARIN SODIUM (PORCINE) 1000 UNIT/ML DIALYSIS
1000.0000 [IU] | INTRAMUSCULAR | Status: DC | PRN
  Filled 2013-05-30: qty 1

## 2013-05-30 MED ORDER — INSULIN GLARGINE 100 UNIT/ML ~~LOC~~ SOLN
20.0000 [IU] | Freq: Every day | SUBCUTANEOUS | Status: DC
  Administered 2013-05-31 – 2013-06-01 (×2): 20 [IU] via SUBCUTANEOUS
  Filled 2013-05-30 (×3): qty 0.2

## 2013-05-30 MED ORDER — FENTANYL CITRATE 0.05 MG/ML IJ SOLN
INTRAMUSCULAR | Status: DC | PRN
  Administered 2013-05-30 (×6): 25 ug via INTRAVENOUS

## 2013-05-30 MED ORDER — MIDAZOLAM HCL 5 MG/5ML IJ SOLN
INTRAMUSCULAR | Status: DC | PRN
  Administered 2013-05-30 (×2): 1 mg via INTRAVENOUS

## 2013-05-30 MED ORDER — ONDANSETRON HCL 4 MG/2ML IJ SOLN
INTRAMUSCULAR | Status: AC
  Filled 2013-05-30: qty 2

## 2013-05-30 MED ORDER — LIDOCAINE HCL (CARDIAC) 20 MG/ML IV SOLN
INTRAVENOUS | Status: AC
  Filled 2013-05-30: qty 5

## 2013-05-30 SURGICAL SUPPLY — 60 items
ADH SKN CLS APL DERMABOND .7 (GAUZE/BANDAGES/DRESSINGS) ×2
ARMBAND PINK RESTRICT EXTREMIT (MISCELLANEOUS) ×3 IMPLANT
BAG DECANTER FOR FLEXI CONT (MISCELLANEOUS) ×3 IMPLANT
CANISTER SUCTION 2500CC (MISCELLANEOUS) ×3 IMPLANT
CATH CANNON HEMO 15F 50CM (CATHETERS) IMPLANT
CATH CANNON HEMO 15FR 19 (HEMODIALYSIS SUPPLIES) IMPLANT
CATH CANNON HEMO 15FR 23CM (HEMODIALYSIS SUPPLIES) ×3 IMPLANT
CATH CANNON HEMO 15FR 31CM (HEMODIALYSIS SUPPLIES) IMPLANT
CATH CANNON HEMO 15FR 32CM (HEMODIALYSIS SUPPLIES) IMPLANT
CATH STRAIGHT 5FR 65CM (CATHETERS) ×3 IMPLANT
CLIP TI MEDIUM 6 (CLIP) ×3 IMPLANT
CLIP TI WIDE RED SMALL 6 (CLIP) ×3 IMPLANT
COVER PROBE W GEL 5X96 (DRAPES) ×6 IMPLANT
COVER SURGICAL LIGHT HANDLE (MISCELLANEOUS) ×3 IMPLANT
DECANTER SPIKE VIAL GLASS SM (MISCELLANEOUS) ×3 IMPLANT
DERMABOND ADVANCED (GAUZE/BANDAGES/DRESSINGS) ×2
DERMABOND ADVANCED .7 DNX12 (GAUZE/BANDAGES/DRESSINGS) ×4 IMPLANT
DRAIN PENROSE 1/4X12 LTX STRL (WOUND CARE) ×3 IMPLANT
DRAPE C-ARM 42X72 X-RAY (DRAPES) ×3 IMPLANT
DRAPE CHEST BREAST 15X10 FENES (DRAPES) ×3 IMPLANT
ELECT REM PT RETURN 9FT ADLT (ELECTROSURGICAL) ×3
ELECTRODE REM PT RTRN 9FT ADLT (ELECTROSURGICAL) ×2 IMPLANT
GAUZE SPONGE 2X2 8PLY STRL LF (GAUZE/BANDAGES/DRESSINGS) ×2 IMPLANT
GAUZE SPONGE 4X4 16PLY XRAY LF (GAUZE/BANDAGES/DRESSINGS) ×3 IMPLANT
GLOVE BIO SURGEON STRL SZ7 (GLOVE) ×6 IMPLANT
GLOVE BIOGEL PI IND STRL 6.5 (GLOVE) ×6 IMPLANT
GLOVE BIOGEL PI IND STRL 7.5 (GLOVE) ×6 IMPLANT
GLOVE BIOGEL PI INDICATOR 6.5 (GLOVE) ×3
GLOVE BIOGEL PI INDICATOR 7.5 (GLOVE) ×3
GOWN STRL REUS W/ TWL LRG LVL3 (GOWN DISPOSABLE) ×6 IMPLANT
GOWN STRL REUS W/TWL LRG LVL3 (GOWN DISPOSABLE) ×3
KIT BASIN OR (CUSTOM PROCEDURE TRAY) ×3 IMPLANT
KIT ROOM TURNOVER OR (KITS) ×3 IMPLANT
NEEDLE 18GX1X1/2 (RX/OR ONLY) (NEEDLE) ×3 IMPLANT
NEEDLE HYPO 25GX1X1/2 BEV (NEEDLE) ×3 IMPLANT
NS IRRIG 1000ML POUR BTL (IV SOLUTION) ×3 IMPLANT
PACK CV ACCESS (CUSTOM PROCEDURE TRAY) ×3 IMPLANT
PACK SURGICAL SETUP 50X90 (CUSTOM PROCEDURE TRAY) ×3 IMPLANT
PAD ARMBOARD 7.5X6 YLW CONV (MISCELLANEOUS) ×6 IMPLANT
SET MICROPUNCTURE 5F STIFF (MISCELLANEOUS) IMPLANT
SOAP 2 % CHG 4 OZ (WOUND CARE) ×3 IMPLANT
SPONGE GAUZE 2X2 STER 10/PKG (GAUZE/BANDAGES/DRESSINGS) ×1
SPONGE SURGIFOAM ABS GEL 100 (HEMOSTASIS) IMPLANT
SUT ETHILON 3 0 PS 1 (SUTURE) ×3 IMPLANT
SUT MNCRL AB 4-0 PS2 18 (SUTURE) ×6 IMPLANT
SUT PROLENE 6 0 BV (SUTURE) IMPLANT
SUT PROLENE 7 0 BV 1 (SUTURE) ×3 IMPLANT
SUT VIC AB 3-0 SH 27 (SUTURE) ×1
SUT VIC AB 3-0 SH 27X BRD (SUTURE) ×2 IMPLANT
SYR 20CC LL (SYRINGE) ×6 IMPLANT
SYR 3ML LL SCALE MARK (SYRINGE) ×3 IMPLANT
SYR 5ML LL (SYRINGE) ×3 IMPLANT
SYR CONTROL 10ML LL (SYRINGE) ×3 IMPLANT
SYRINGE 10CC LL (SYRINGE) ×3 IMPLANT
TAPE CLOTH SURG 4X10 WHT LF (GAUZE/BANDAGES/DRESSINGS) ×3 IMPLANT
TOWEL OR 17X24 6PK STRL BLUE (TOWEL DISPOSABLE) ×3 IMPLANT
TOWEL OR 17X26 10 PK STRL BLUE (TOWEL DISPOSABLE) ×3 IMPLANT
UNDERPAD 30X30 INCONTINENT (UNDERPADS AND DIAPERS) ×3 IMPLANT
WATER STERILE IRR 1000ML POUR (IV SOLUTION) ×3 IMPLANT
WIRE AMPLATZ SS-J .035X180CM (WIRE) ×3 IMPLANT

## 2013-05-30 NOTE — Transfer of Care (Signed)
Immediate Anesthesia Transfer of Care Note  Patient: Bob Maldonado  Procedure(s) Performed: Procedure(s): INSERTION OF DIALYSIS CATHETER (Right) Brachial vein transposition 1st stage (Left)  Patient Location: PACU  Anesthesia Type:MAC  Level of Consciousness: awake, alert  and oriented  Airway & Oxygen Therapy: Patient Spontanous Breathing  Post-op Assessment: Report given to PACU RN and Post -op Vital signs reviewed and stable  Post vital signs: Reviewed and stable  Complications: No apparent anesthesia complications

## 2013-05-30 NOTE — H&P (View-Only) (Signed)
  Vascular Surgery Consultation  Reason for Consult: Needs hemodialysis catheter and vascular access-dialysis initiated today through right femoral catheter  HPI: Bob Maldonado is a 58 y.o. male who presents for evaluation of tunneled catheter and permanent access. Patient has diabetes hypertension and hyperlipidemia and has refused dialysis consideration in past. Currently on hemodialysis the right femoral catheter.   Past Medical History  Diagnosis Date  . Diabetes mellitus without complication   . Hypertension   . Hyperlipidemia   . Gout   . Cancer     prostate / Declines treatment  . Chronic kidney disease     ESRD- Declines Dialysis   Past Surgical History  Procedure Laterality Date  . Right knee  1985   History   Social History  . Marital Status: Single    Spouse Name: N/A    Number of Children: N/A  . Years of Education: N/A   Social History Main Topics  . Smoking status: Former Research scientist (life sciences)  . Smokeless tobacco: Never Used     Comment: quit 25 years prior  . Alcohol Use: No     Comment: rare  . Drug Use: No  . Sexual Activity: Yes   Other Topics Concern  . None   Social History Narrative  . None   Family History  Problem Relation Age of Onset  . Diabetes Mother   . Heart disease Mother   . Cancer Father   . Heart disease Brother    No Known Allergies Prior to Admission medications   Medication Sig Start Date End Date Taking? Authorizing Provider  furosemide (LASIX) 40 MG tablet Take 2 tablets (80 mg total) by mouth 2 (two) times daily. 05/26/13  Yes Alycia Rossetti, MD  Insulin Glargine (LANTUS SOLOSTAR) 100 UNIT/ML Solostar Pen Inject 30 Units into the skin at bedtime. 04/18/13  Yes Alycia Rossetti, MD  metoprolol succinate (TOPROL XL) 100 MG 24 hr tablet Take 1 tablet (100 mg total) by mouth daily. Take with or immediately following a meal. 07/12/12 07/12/13 Yes Alycia Rossetti, MD  pravastatin (PRAVACHOL) 40 MG tablet Take 1 tablet (40 mg total) by  mouth daily. 07/12/12  Yes Alycia Rossetti, MD  NIFEdipine (ADALAT CC) 90 MG 24 hr tablet Take 1 tablet (90 mg total) by mouth daily. 05/25/13   Alycia Rossetti, MD     Positive ROS  All other systems have been reviewed and were otherwise negative with the exception of those mentioned in the HPI and as above.  Physical Exam: Filed Vitals:   05/27/13 1500  BP: 175/106  Pulse: 74  Temp:   Resp:     General: Alert, no acute distress HEENT: Normal for age Cardiovascular: Regular rate and rhythm. Carotid pulses 2+, no bruits audible Respiratory: Clear to auscultation. No cyanosis, no use of accessory musculature GI: No organomegaly, abdomen is soft and non-tender Skin: No lesions in the area of chief complaint Neurologic: Sensation intact distally Psychiatric: Patient is competent for consent with normal mood and affect Musculoskeletal: No obvious deformities Extremities 2+ brachial and radial pulses bilaterally.    Imaging reviewed: Vein mapping not done yet   Assessment/Plan:  Will schedule for insertion of tunneled dialysis catheter and possible left arm AV fistula on Tuesday by Dr. Adele Barthel Will order vein mapping bilateral upper extremities   Tinnie Gens, MD 05/27/2013 3:15 PM

## 2013-05-30 NOTE — Progress Notes (Signed)
Patient Demographics  Bob Maldonado, is a 58 y.o. male, DOB - Aug 30, 1955, [id number]  Admit date - 05/26/2013   Admitting Physician Theressa Millard, MD  Outpatient Primary MD for the patient is Vic Blackbird, MD  LOS - 4   Chief Complaint  Patient presents with  . Shortness of Breath      Brief summary.  This is a 58 year old African American gentleman with history of insulin-dependent type 2 diabetes mellitus, hypertension, dyslipidemia, ESRD who was refusing dialysis for the last several months, now developed shortness of breath and presented to the ER expressing his desire to be dialyzed, was seen by renal and vascular surgery, has now been initiated on dialysis through a right IJ dialysis catheter, initially had femoral access. Now feeling better, nephrology team is trying to set him up with outpatient dialysis for discharge purposes. He is relatively symptom-free.  Assessment & Plan      1. ESRD - Untreated, but now desires treatment, renal onboard he underwent right femoral dialysis catheter placement by vascular surgery on 05/27/2013 and dialysis was initiated on the same date.    2. Hyperkalemia- due to #1, resolved after Kayexalate and dialysis     3. SOB and Peripheral Edema due to #1, and Fluid Retention. Not requiring oxygen and symptom-free at rest, continue dialysis.    4. Anemia of chronic Disease and Renal Disease- our goal to keep hemoglobin above 7. Will order 2 units of packed RBCs to be on hold and transfuse with next dialysis run.    5. Untreated Prostate Cancer- Schedule further workup as outpatient with Oncology and or Urology.     6. DM2- A1c noted, morning CBG was low reduce Lantus, continue q. a.c. sliding scale  Lab Results  Component Value Date     HGBA1C 5.8* 05/27/2013    CBG (last 3)   Recent Labs  05/30/13 0406 05/30/13 0826 05/30/13 1118  GLUCAP 79 79 77      7. Essential HTN- Continue Nifedipine, Toprol, will increase hydralazine dose and add scheduled clonidine for better control, monitor blood pressure and adjust medications as needed.    8. Hyperlipidemia- Continue Statin Rx.     9. Gout- Stable.     10. Hypocalcemia due to untreated ESRD. Replaced IV again on 05/29/2013, placed on oral by Timothy supplementation by renal follow.      Code Status: Full  Family Communication: None present  Disposition Plan: Home   Procedures by vascular surgery on 05/27/2013 right femoral dialysis catheter, hemodialysis initiated on 05-27-13, 05/30/2013 right IJ dialysis catheter placed.   Consults  Renal, vascular surgery   Medications  Scheduled Meds: . calcium acetate  1,334 mg Oral TID WC  . cloNIDine  0.1 mg Oral TID  . [START ON 06/05/2013] darbepoetin (ARANESP) injection - DIALYSIS  100 mcg Intravenous Q Mon-HD  . doxercalciferol  2 mcg Intravenous Once  . doxercalciferol  2 mcg Intravenous Q M,W,F-HD  . heparin subcutaneous  5,000 Units Subcutaneous 3 times per day  . hydrALAZINE  100 mg Oral 3 times per day  . insulin aspart  0-9 Units Subcutaneous TID WC  . insulin glargine  20 Units Subcutaneous QHS  . metoprolol succinate  100 mg Oral Daily  .  multivitamin  1 tablet Oral QHS  . NIFEdipine  90 mg Oral Daily  . simvastatin  20 mg Oral q1800  . sodium chloride  3 mL Intravenous Q12H  . sodium chloride  3 mL Intravenous Q12H   Continuous Infusions: . sodium chloride 20 mL/hr at 05/30/13 0838   PRN Meds:.sodium chloride, sodium chloride, sodium chloride, acetaminophen, alteplase, feeding supplement (NEPRO CARB STEADY), heparin, [START ON 05/31/2013] heparin, hydrALAZINE, HYDROmorphone (DILAUDID) injection, lidocaine (PF), lidocaine-prilocaine, ondansetron (ZOFRAN) IV, oxyCODONE,  pentafluoroprop-tetrafluoroeth, sodium chloride  DVT Prophylaxis    Heparin    Lab Results  Component Value Date   PLT 172 05/30/2013    Antibiotics     Anti-infectives   None          Subjective:   [name] today has, No headache, No chest pain, No abdominal pain - No Nausea, No new weakness tingling or numbness, No Cough - SOB.    Objective:   Filed Vitals:   05/30/13 1500 05/30/13 1530 05/30/13 1600 05/30/13 1630  BP: 171/68 174/73 183/73 185/67  Pulse: 97 94 103 99  Temp:      TempSrc:      Resp:      Height:      Weight:      SpO2:        Wt Readings from Last 3 Encounters:  05/30/13 115.3 kg (254 lb 3.1 oz)  05/30/13 115.3 kg (254 lb 3.1 oz)  05/26/13 125.193 kg (276 lb)     Intake/Output Summary (Last 24 hours) at 05/30/13 1705 Last data filed at 05/30/13 1118  Gross per 24 hour  Intake    520 ml  Output   2830 ml  Net  -2310 ml     Physical Exam  Awake Alert, Oriented X 3, No new F.N deficits, Normal affect [place].AT,PERRAL Supple Neck,No JVD, No cervical lymphadenopathy appriciated.  Symmetrical Chest wall movement, Good air movement bilaterally, few rales , right IJ dialysis catheter in place site appears clean RRR,No Gallops,Rubs or new Murmurs, No Parasternal Heave +ve B.Sounds, Abd Soft, Non tender, No organomegaly appriciated, No rebound - guarding or rigidity. No Cyanosis, Clubbing or edema, No new Rash or bruise, 1+ edema      Data Review   Micro Results Recent Results (from the past 240 hour(s))  SURGICAL PCR SCREEN     Status: None   Collection Time    05/30/13  7:44 AM      Result Value Ref Range Status   MRSA, PCR NEGATIVE  NEGATIVE Final   Staphylococcus aureus NEGATIVE  NEGATIVE Final   Comment:            The Xpert SA Assay (FDA     approved for NASAL specimens     in patients over 37 years of age),     is one component of     a comprehensive surveillance     program.  Test performance has     been validated by  [employer] for patients greater     than or equal to 57 year old.     It is not intended     to diagnose infection nor to     guide or monitor treatment.    Radiology Reports Dg Chest 2 View  05/26/2013   CLINICAL DATA:  Shortness of breath, end-stage renal disease, and prostate cancer.  EXAM: CHEST  2 VIEW  COMPARISON:  06/01/2005  FINDINGS: The cardiac silhouette is upper  limits of normal in size. There is mildly increased elevation the right hemidiaphragm. There is question of a new, small right pleural effusion. There is a well-circumscribed density in the posterior lung base on the lateral image which partially obscures the right hemidiaphragm. The left lung is clear. No pneumothorax is identified.  IMPRESSION: 1. Possible small right pleural effusion. 2. Well circumscribed opacity in the posterior right lung base, query Bochdalek's hernia.   Electronically Signed   By: Logan Bores   On: 05/26/2013 14:24    CBC  Recent Labs Lab 05/26/13 1114 05/26/13 1750 05/27/13 0642 05/30/13 0545  WBC 8.5 8.5 7.3 8.8  HGB 7.8* 7.5* 7.8* 7.0*  HCT 23.6* 22.8* 23.6* 22.1*  PLT 250 228 222 172  MCV 80.5 81.7 81.9 84.0  MCH 26.6 26.9 27.1 26.6  MCHC 33.1 32.9 33.1 31.7  RDW 17.1* 15.3 15.5 15.6*  LYMPHSABS 0.5* 1.0  --   --   MONOABS 0.9 1.0  --   --   EOSABS 0.1 0.2  --   --   BASOSABS 0.0 0.0  --   --     Chemistries   Recent Labs Lab 05/26/13 1114 05/26/13 1750 05/27/13 0642 05/30/13 0545  NA 140 143 145 141  K 5.8* 5.5* 4.8 3.9  CL 104 100 102 99  CO2 13* 16* 15* 23  GLUCOSE 112* 102* 57* 76  BUN 169* 150* 151* 68*  CREATININE 15.58* 16.26* 16.20* 10.35*  CALCIUM 5.0* 5.6* 5.5* 7.3*  AST 39* 37  --   --   ALT 24 22  --   --   ALKPHOS 64 68  --   --   BILITOT 0.3 <0.2*  --   --    ------------------------------------------------------------------------------------------------------------------ estimated creatinine clearance is 10.5 ml/min (by C-G formula based on  Cr of 10.35). ------------------------------------------------------------------------------------------------------------------ No results found for this basename: HGBA1C,  in the last 72 hours ------------------------------------------------------------------------------------------------------------------ No results found for this basename: CHOL, HDL, LDLCALC, TRIG, CHOLHDL, LDLDIRECT,  in the last 72 hours ------------------------------------------------------------------------------------------------------------------ No results found for this basename: TSH, T4TOTAL, FREET3, T3FREE, THYROIDAB,  in the last 72 hours ------------------------------------------------------------------------------------------------------------------ No results found for this basename: VITAMINB12, FOLATE, FERRITIN, TIBC, IRON, RETICCTPCT,  in the last 72 hours  Coagulation profile  Recent Labs Lab 05/30/13 0545  INR 1.05    No results found for this basename: DDIMER,  in the last 72 hours  Cardiac Enzymes No results found for this basename: CK, CKMB, TROPONINI, MYOGLOBIN,  in the last 168 hours ------------------------------------------------------------------------------------------------------------------ No components found with this basename: POCBNP,      Time Spent in minutes   35   Thurnell Lose M.D on 05/30/2013 at 5:05 PM  Between 7am to 7pm - Pager - [phone]  After 7pm go to [email or web address] - password [email or web address]  And look for the night coverage person covering for me after hours  [facility] Office  [phone]

## 2013-05-30 NOTE — Op Note (Signed)
OPERATIVE NOTE  PROCEDURE: 1. Left first stage brachial vein transposition  2. Right internal jugular vein tunneled dialysis catheter placement 3. Right internal jugular vein cannulation under ultrasound guidance  PRE-OPERATIVE DIAGNOSIS: end-stage renal failure  POST-OPERATIVE DIAGNOSIS: same as above  SURGEON: Adele Barthel, MD  ANESTHESIA: local and MAC  ESTIMATED BLOOD LOSS: 30 cc  FINDING(S): 1.  Tips of the catheter in the right atrium on fluoroscopy 2.  No obvious pneumothorax on fluoroscopy 3.  No acceptable basilic or cephalic vein on ultrasound evaluation 4.  Palpable thrill and radial pulse at end of case  SPECIMEN(S):  none  INDICATIONS:   Bob Maldonado is a 58 y.o. male who presents with end stage renal disease.  The patient presents for tunneled dialysis catheter placement and left arm fistula placement.  The patient is aware the risks of tunneled dialysis catheter placement include but are not limited to: bleeding, infection, central venous injury, pneumothorax, possible venous stenosis, possible malpositioning in the venous system, and possible infections related to long-term catheter presence.  Risk, benefits, and alternatives to access surgery were discussed.  The patient is aware the risks include but are not limited to: bleeding, infection, steal syndrome, nerve damage, ischemic monomelic neuropathy, failure to mature, need for additional procedures, death and stroke.  The patient agrees to proceed forward with the procedure.  The patient is aware if a basilic vein transposition is placed it will take two operations to get its usable.   DESCRIPTION: After written full informed consent was obtained from the patient, the patient was taken back to the operating room.  Prior to induction, the patient was given IV antibiotics.  After obtaining adequate sedation, the patient was prepped and draped in the standard fashion for a chest or neck tunneled dialysis catheter  placement.  I anesthesized the neck cannulation site with local anesthetic.  Under ultrasound guidance, the right internal jugular vein was cannulated with the 18 gauge needle.  A J-wire was then placed down in the right ventricle under fluroscopic guidance.  The wire was then secured in place with a clamp to the drapes.  The cannulation site, the catheter exit site, and tract for the subcutaneous tunnel were then anesthesized with a total of 20 cc of a 1:1 mixture of 0.5% Marcaine without epinepherine and 1% Lidocaine with epinepherine.  I then made stab incisions at the neck and exit sites.   I dissected from the exit site to the cannulation site with a metal tunneler.   The subcutaneous tunnel was dilated by passing a plastic dilator over the metal dissector. The wire was then unclamped and I removed the needle.  I tried to pass the smallest skin dilator over the wire, but the dilator began to buckle.  I placed an endhole catheter over the wire under fluoroscopy and exchanged the wire for an Amplatz wire.  The catheter was removed.  The skin tract and venotomy was dilated serially with dilators.  Finally, the dilator-sheath was placed under fluroscopic guidance into the superior vena cava.  The dilator and wire were removed.  A 23 cm Diatek catheter was placed under fluoroscopic guidance down into the right atrium.  The sheath was broken and peeled away while holding the catheter cuff at the level of the skin.  The back end of this catheter was transected, revealing the two lumens of this catheter.  The ports were docked onto these two lumens.  The catheter hub was then screwed into place.  Each port was tested by aspirating and flushing.  No resistance was noted.  Each port was then thoroughly flushed with heparinized saline.  The catheter was secured in placed with two interrupted stitches of 3-0 Nylon tied to the catheter.  The neck incision was closed with a U-stitch of 4-0 Monocryl.  The neck and chest  incision were cleaned and sterile bandages applied.  Each port was then loaded with concentrated heparin (1000 Units/mL) at the manufacturer recommended volumes to each port.  Sterile caps were applied to each port.  On completion fluoroscopy, the tips of the catheter were in the right atrium, and there was no evidence of pneumothorax.  At this point, the drapes were taken down.  The patient was then reprepped and redraped in the standard fashion for a left arm access procedure.  I turned my attention first to identifying the patient's brachial artery and evaluating his upper arm veins.  I could not see usable veins, so I placed a sterile Penrose drain around the left upper arm as a tourniquet.  I tightened the tourniquet to distend the veins.  Using SonoSite guidance, I would not identify a cephalic or basilic vein that was adequately sized for access purpose.  There was a large brachial vein, so I elected to place a brachial vein transposition.   At this point, I injected local anesthetic to obtain a field block of the antecubitum.  In total, I injected about 5 mL of a 1:1 mixture of 0.5% Marcaine without epinephrine and 1% lidocaine with epinephrine.  I made a longitudinal incision over the brachial artery and dissected through the subcutaneous tissue and fascia to gain exposure of the brachial artery.  This was noted to be 5 mm in diameter externally.  This was dissected out proximally and distally and controlled with vessel loops .  I then dissected out the brachial vein.  This was noted to be 4-5 mm in diameter externally.  The distal segment of the vein was ligated with a  2-0 silk, and the vein was transected.  The proximal segment was iinterrogated with serial dilators.  The vein accepted up to a 4 mm dilator without any difficulty.  I then instilled the heparinized saline into the vein and clamped it.  At this point, I reset my exposure of the brachial artery and placed the artery under tension  proximally and distally.  I made an arteriotomy with a #11 blade, and then I extended the arteriotomy with a Potts scissor.  I injected heparinized saline proximal and distal to this arteriotomy.  The vein was then sewn to the artery in an end-to-side configuration with a running stitch of 6-0 Prolene.  Prior to completing this anastomosis, I allowed the vein and artery to backbleed.  There was no evidence of clot from any vessels.  I completed the anastomosis in the usual fashion and then released all vessel loops and clamps.  There was a palpable thrill in the venous outflow, and there was a palpable radial pulse.  At this point, I irrigated out the surgical wound.  There was no further active bleeding.  The subcutaneous tissue was reapproximated with a running stitch of 3-0 Vicryl.  The skin was then reapproximated with a running subcuticular stitch of 4-0 Vicryl.  The skin was then cleaned, dried, and reinforced with Dermabond.  The patient tolerated this procedure well.   COMPLICATIONS: none  CONDITION: stable  Adele Barthel, MD [facility] Office: [phone] Pager:  [phone]  05/30/2013, 9:56 AM

## 2013-05-30 NOTE — Progress Notes (Signed)
Admit: 05/26/2013 LOS: 4  33M new start ESRD / CKD-5HD  Subjective:  HD yesterday TDC and 1st stage of BVT today Still nausea   04/13 0701 - 04/14 0700 In: 600 [P.O.:600] Out: 3500 [Urine:700]  Filed Weights   05/29/13 1833 05/29/13 2210 05/30/13 1421  Weight: 117.5 kg (259 lb 0.7 oz) 115.35 kg (254 lb 4.8 oz) 115.3 kg (254 lb 3.1 oz)    Current meds: reviewed PhosLo 2qAC, Aranesp 40 qMon, Hectorol 2qTx Current Labs: reviewed    Physical Exam:  Blood pressure 171/68, pulse 97, temperature 98.5 F (36.9 C), temperature source Oral, resp. rate 12, height 6\' 3"  (1.905 m), weight 115.3 kg (254 lb 3.1 oz), SpO2 99.00%. NAD RRR, no rub Bibasilar crackles 2+ LEE Nonfocal, no asterixis S/NT/ND  Assessment 1. New ESRD (Start 4/11) 2. Anemia, Fe and B12 replete 3. 2HPTH 4. Hyperphosphatemia 5. HTN 6. Access 7. Prostate Cancer  Plan 1. HD#3 today, if TDC used successfully remove femoral temp cath 2. Appreciate assistance of VVS 3. If Hb does not improve, consider transfusion at next Tx 4. Cont binder, phos will improve with RRT  Pearson Grippe MD 05/30/2013, 3:31 PM   Recent Labs Lab 05/26/13 1750 05/27/13 0642 05/30/13 0545  NA 143 145 141  K 5.5* 4.8 3.9  CL 100 102 99  CO2 16* 15* 23  GLUCOSE 102* 57* 76  BUN 150* 151* 68*  CREATININE 16.26* 16.20* 10.35*  CALCIUM 5.6* 5.5* 7.3*  PHOS  --  12.3*  --     Recent Labs Lab 05/26/13 1114 05/26/13 1750 05/27/13 0642 05/30/13 0545  WBC 8.5 8.5 7.3 8.8  NEUTROABS 7.1 6.3  --   --   HGB 7.8* 7.5* 7.8* 7.0*  HCT 23.6* 22.8* 23.6* 22.1*  MCV 80.5 81.7 81.9 84.0  PLT 250 228 222 172

## 2013-05-30 NOTE — Interval H&P Note (Signed)
[facility]  History and Physical Update  The patient was interviewed and re-examined.  The patient's previous History and Physical has been reviewed and is unchanged from Dr. Evelena Leyden consult.  Based on the vein mapping, I am offering the patient a left staged basilic vein transposition and tunneled dialysis catheter placement.  Risk, benefits, and alternatives to access surgery were discussed.  The patient understands that his fistula is done in a staged fashion requiring two operations.  The patient is aware the risks include but are not limited to: bleeding, infection, steal syndrome, nerve damage, ischemic monomelic neuropathy, failure to mature, need for additional procedures, death and stroke.  The patient agrees to proceed forward with the procedure.  Adele Barthel, MD [facility] Office: [phone] Pager: [phone]  05/30/2013, 8:45 AM

## 2013-05-30 NOTE — OR Nursing (Signed)
First procedure ( Diatek catheter) ended at [phone].

## 2013-05-30 NOTE — Anesthesia Preprocedure Evaluation (Signed)
Anesthesia Evaluation  Patient identified by MRN, date of birth, ID band Patient awake    Reviewed: Allergy & Precautions, H&P , NPO status , Patient's Chart, lab work & pertinent test results  History of Anesthesia Complications Negative for: history of anesthetic complications  Airway Mallampati: III TM Distance: >3 FB Neck ROM: Full    Dental  (+) Teeth Intact   Pulmonary neg sleep apnea, neg COPDformer smoker,  breath sounds clear to auscultation        Cardiovascular hypertension, Pt. on medications Rhythm:Regular     Neuro/Psych negative neurological ROS  negative psych ROS   GI/Hepatic negative GI ROS, Neg liver ROS,   Endo/Other  diabetes, Type 2, Insulin Dependent  Renal/GU ESRFRenal disease     Musculoskeletal   Abdominal   Peds  Hematology  (+) anemia ,   Anesthesia Other Findings   Reproductive/Obstetrics                           Anesthesia Physical Anesthesia Plan  ASA: III  Anesthesia Plan: MAC   Post-op Pain Management:    Induction: Intravenous  Airway Management Planned: Natural Airway and Simple Face Mask  Additional Equipment: None  Intra-op Plan:   Post-operative Plan:   Informed Consent: I have reviewed the patients History and Physical, chart, labs and discussed the procedure including the risks, benefits and alternatives for the proposed anesthesia with the patient or authorized representative who has indicated his/her understanding and acceptance.   Dental advisory given  Plan Discussed with: CRNA and Surgeon  Anesthesia Plan Comments:         Anesthesia Quick Evaluation

## 2013-05-30 NOTE — Anesthesia Postprocedure Evaluation (Signed)
  Anesthesia Post-op Note  Patient: Bob Maldonado  Procedure(s) Performed: Procedure(s): INSERTION OF DIALYSIS CATHETER (Right) Brachial vein transposition 1st stage (Left)  Patient Location: PACU  Anesthesia Type:MAC  Level of Consciousness: awake and alert   Airway and Oxygen Therapy: Patient Spontanous Breathing  Post-op Pain: none  Post-op Assessment: Post-op Vital signs reviewed, Patient's Cardiovascular Status Stable, Respiratory Function Stable, Patent Airway, No signs of Nausea or vomiting and Adequate PO intake  Post-op Vital Signs: Reviewed and stable  Last Vitals:  Filed Vitals:   05/30/13 1500  BP: 171/68  Pulse: 97  Temp:   Resp:     Complications: No apparent anesthesia complications

## 2013-05-30 NOTE — Anesthesia Procedure Notes (Signed)
Procedure Name: MAC Performed by: Maryland Pink Pre-anesthesia Checklist: Patient identified, Timeout performed, Emergency Drugs available, Suction available and Patient being monitored Patient Re-evaluated:Patient Re-evaluated prior to inductionOxygen Delivery Method: Simple face mask Preoxygenation: Pre-oxygenation with 100% oxygen Intubation Type: IV induction Number of attempts: 1 Placement Confirmation: positive ETCO2 Dental Injury: Teeth and Oropharynx as per pre-operative assessment

## 2013-05-31 ENCOUNTER — Encounter (HOSPITAL_COMMUNITY): Payer: Self-pay | Admitting: Vascular Surgery

## 2013-05-31 ENCOUNTER — Telehealth: Payer: Self-pay | Admitting: Vascular Surgery

## 2013-05-31 LAB — GLUCOSE, CAPILLARY
GLUCOSE-CAPILLARY: 118 mg/dL — AB (ref 70–99)
GLUCOSE-CAPILLARY: 174 mg/dL — AB (ref 70–99)
Glucose-Capillary: 139 mg/dL — ABNORMAL HIGH (ref 70–99)
Glucose-Capillary: 213 mg/dL — ABNORMAL HIGH (ref 70–99)

## 2013-05-31 LAB — HEMOGLOBIN AND HEMATOCRIT, BLOOD
HCT: 24 % — ABNORMAL LOW (ref 39.0–52.0)
Hemoglobin: 7.4 g/dL — ABNORMAL LOW (ref 13.0–17.0)

## 2013-05-31 MED ORDER — DOXERCALCIFEROL 4 MCG/2ML IV SOLN
2.0000 ug | INTRAVENOUS | Status: DC
  Filled 2013-05-31: qty 2

## 2013-05-31 MED ORDER — PREDNISONE 20 MG PO TABS
20.0000 mg | ORAL_TABLET | Freq: Once | ORAL | Status: AC
  Administered 2013-05-31: 20 mg via ORAL
  Filled 2013-05-31: qty 1

## 2013-05-31 MED ORDER — PREDNISONE 10 MG PO TABS
10.0000 mg | ORAL_TABLET | Freq: Every day | ORAL | Status: DC
  Administered 2013-06-01: 10 mg via ORAL
  Filled 2013-05-31 (×2): qty 1

## 2013-05-31 NOTE — Progress Notes (Signed)
Admit: 05/26/2013 LOS: 5  27M new start ESRD / CKD-5HD  Subjective:  HD yesterday via new TDC 1st stage BVT done, f/u with [facility] 6wk Nausea lessening, ate full clear liquids today Answered questions for wife No outpt HD chair yet   04/14 0701 - 04/15 0700 In: 520 [P.O.:120; I.V.:400] Out: 3030 [Blood:30]  Filed Weights   05/30/13 1421 05/30/13 1816 05/30/13 2216  Weight: 115.3 kg (254 lb 3.1 oz) 112 kg (246 lb 14.6 oz) 112 kg (246 lb 14.6 oz)    Current meds: reviewed PhosLo 2qAC, Aranesp 40 qMon, Hectorol 2qTx Current Labs: reviewed     Physical Exam:  Blood pressure 177/83, pulse 84, temperature 99.4 F (37.4 C), temperature source Oral, resp. rate 17, height 6\' 3"  (1.905 m), weight 112 kg (246 lb 14.6 oz), SpO2 92.00%. NAD RRR, no rub Bibasilar crackles 2+ LEE Nonfocal, no asterixis S/NT/ND  Assessment 1. New ESRD (Start 4/11) 2. Anemia, Fe and B12 replete 3. 2HPTH 4. Hyperphosphatemia 5. HTN 6. Access 7. Prostate Cancer  Plan 1. Next HD tomorrow, 4/16 2. OK to remove femoral HD Cath 3. PT eval  4. Appreciate assistance of [facility] 5. If Hb does not improve, consider transfusion at next Tx 6. Cont binder, phos will improve with RRT  Pearson Grippe MD 05/31/2013, 12:50 PM   Recent Labs Lab 05/26/13 1750 05/27/13 0642 05/30/13 0545  NA 143 145 141  K 5.5* 4.8 3.9  CL 100 102 99  CO2 16* 15* 23  GLUCOSE 102* 57* 76  BUN 150* 151* 68*  CREATININE 16.26* 16.20* 10.35*  CALCIUM 5.6* 5.5* 7.3*  PHOS  --  12.3*  --     Recent Labs Lab 05/26/13 1114 05/26/13 1750 05/27/13 0642 05/30/13 0545  WBC 8.5 8.5 7.3 8.8  NEUTROABS 7.1 6.3  --   --   HGB 7.8* 7.5* 7.8* 7.0*  HCT 23.6* 22.8* 23.6* 22.1*  MCV 80.5 81.7 81.9 84.0  PLT 250 228 222 172

## 2013-05-31 NOTE — Progress Notes (Signed)
PT Cancellation Note  Patient Details Name: Bob Maldonado MRN: [id number] DOB: 07/26/1955   Cancelled Treatment:    Reason Eval/Treat Not Completed: Medical issues which prohibited therapy; attempted to see patient earlier this pm.  Wanted to wait to get HD femoral catheter out.  Checked back and RN holding pressure on site after removal and pt to lay flat.  Will see pt tomorrow.   Max Sane 05/31/2013, 4:43 PM

## 2013-05-31 NOTE — Progress Notes (Signed)
   Daily Progress Note  Assessment/Planning: POD #1 s/p RIJ TDC, L 1st BRVT   No steal sx  Follow up in office in 6 weeks to check maturation of L BRVT.  Suspect will be successful but will need transposition in 6-8 weeks.  Subjective  - 1 Day Post-Op  Minimal pain  Objective Filed Vitals:   05/30/13 2216 05/31/13 0558 05/31/13 0745 05/31/13 1010  BP: 169/74 170/76 169/75 177/83  Pulse: 84 83 82 84  Temp: 98.6 F (37 C) 99 F (37.2 C) 98.6 F (37 C) 100.1 F (37.8 C)  TempSrc: Oral Oral Oral Oral  Resp: 16 16 18 17   Height: 6\' 3"  (1.905 m)     Weight: 246 lb 14.6 oz (112 kg)     SpO2: 96% 91%  92%    Intake/Output Summary (Last 24 hours) at 05/31/13 1113 Last data filed at 05/31/13 0600  Gross per 24 hour  Intake    120 ml  Output   3030 ml  Net  -2910 ml   VASC  Palpable L radial pulse, inc c/d/i, palp thrill, RIJ TDC sites bandages without active bleeding  Laboratory CBC    Component Value Date/Time   WBC 8.8 05/30/2013 0545   HGB 7.0* 05/30/2013 0545   HCT 22.1* 05/30/2013 0545   PLT 172 05/30/2013 0545    BMET    Component Value Date/Time   NA 141 05/30/2013 0545   K 3.9 05/30/2013 0545   CL 99 05/30/2013 0545   CO2 23 05/30/2013 0545   GLUCOSE 76 05/30/2013 0545   BUN 68* 05/30/2013 0545   CREATININE 10.35* 05/30/2013 0545   CREATININE 15.58* 05/26/2013 1114   CALCIUM 7.3* 05/30/2013 0545   GFRNONAA 5* 05/30/2013 0545   GFRNONAA 9* 07/12/2012 1631   GFRAA 6* 05/30/2013 0545   GFRAA 10* 07/12/2012 Bibo, MD [facility] Office: [phone] Pager: [phone]  05/31/2013, 11:13 AM

## 2013-05-31 NOTE — Progress Notes (Signed)
05/31/2013 12:56 PM Hemodialysis Outpatient Note; this patient has been accepted at the [facility] on a Tuesday, Thursday and Saturday 2nd shift schedule. The patient needs to arrive at the center Thursday April 16,2015 at 1145 to sign paperwork and consents. Thank you. Gordy Savers

## 2013-05-31 NOTE — Progress Notes (Addendum)
[facility] PROGRESS NOTE  Bob Maldonado [id number] DOB: September 06, 1955 DOA: 05/26/2013 PCP: Vic Blackbird, MD Brief summary.  This is a 58 year old African American gentleman with history of insulin-dependent type 2 diabetes mellitus, hypertension, dyslipidemia, ESRD who was refusing dialysis for the last several months, now developed shortness of breath and presented to the ER expressing his desire to be dialyzed, was seen by renal and vascular surgery, has now been initiated on dialysis through a right IJ dialysis catheter, initially had femoral access. Now feeling better, nephrology team is trying to set him up with outpatient dialysis for discharge purposes. He is relatively symptom-free.  Assessment/Plan: 1. ESRD - Untreated but now desires treatment, renal onboard he underwent right femoral dialysis catheter placement by vascular surgery on 05/27/2013 and dialysis was initiated on the same date.  -Dialysis per renal -Still with nausea vomiting likely due to uremia, He is still on clear liquids at this time>> follow and advance as tolerated 2. Hyperkalemia- due to #1, resolved after Kayexalate and dialysis  3. SOB and Peripheral Edema due to #1, and Fluid Retention. Not requiring oxygen and symptom-free at rest, continue dialysis.  4. Anemia of chronic Disease and Renal Disease- our goal to keep hemoglobin above 7.  -Hemoglobin 7.4 today, follow and transfuse as appropriate 5. Untreated Prostate Cancer- Schedule further workup as outpatient with Oncology and or Urology.  6. DM2- A1c noted, morning CBG was low reduce Lantus, continue q. a.c. sliding scale  Lab Results   Component  Value  Date    HGBA1C  5.8*  05/27/2013    CBG (last 3)   Recent Labs   05/30/13 0406  05/30/13 0826  05/30/13 1118   GLUCAP  79  79  77    7. Essential HTN- Continue Nifedipine, Toprol, coontinue hydralazine, clonidine for better control, monitor blood pressure and adjust medications as needed.   8. Hyperlipidemia- Continue Statin Rx.  9. Gout- will treat with quick taper of prednisone  10. Hypocalcemia due to untreated ESRD. Replaced IV again on 05/29/2013, placed on oral per renal.  Code Status: Full  Family Communication: Wife at bedside Disposition Plan: Home   Procedures by vascular surgery on 05/27/2013 right femoral dialysis catheter, hemodialysis initiated on 05-27-13, 05/30/2013 right IJ dialysis catheter placed. Femoral catheter removal today 4/15 Consults Renal, vascular surgery      Antibiotics:  None   HPI/Subjective: Complaining of right toe pain, also still with nausea-denies vomiting today  Objective: Filed Vitals:   05/31/13 1703  BP: 171/68  Pulse: 88  Temp: 98.6 F (37 C)  Resp: 18    Intake/Output Summary (Last 24 hours) at 05/31/13 1706 Last data filed at 05/31/13 1700  Gross per 24 hour  Intake    300 ml  Output   3000 ml  Net  -2700 ml   Filed Weights   05/30/13 1421 05/30/13 1816 05/30/13 2216  Weight: 115.3 kg (254 lb 3.1 oz) 112 kg (246 lb 14.6 oz) 112 kg (246 lb 14.6 oz)    Exam:  General: alert & oriented x 3 In NAD Cardiovascular: RRR, nl S1 s2 Respiratory: CTAB Abdomen: soft +BS NT/ND, no masses palpable Extremities: Tender right great toe, no edema, no cyanosis and no erythema    Data Reviewed: Basic Metabolic Panel:  Recent Labs Lab 05/26/13 1114 05/26/13 1750 05/27/13 0642 05/30/13 0545  NA 140 143 145 141  K 5.8* 5.5* 4.8 3.9  CL 104 100 102 99  CO2 13* 16* 15* 23  GLUCOSE 112* 102* 57* 76  BUN 169* 150* 151* 68*  CREATININE 15.58* 16.26* 16.20* 10.35*  CALCIUM 5.0* 5.6* 5.5* 7.3*  PHOS  --   --  12.3*  --    Liver Function Tests:  Recent Labs Lab 05/26/13 1114 05/26/13 1750  AST 39* 37  ALT 24 22  ALKPHOS 64 68  BILITOT 0.3 <0.2*  PROT 6.0 6.5  ALBUMIN 3.3* 2.9*   No results found for this basename: LIPASE, AMYLASE,  in the last 168 hours No results found for this basename: AMMONIA,  in  the last 168 hours CBC:  Recent Labs Lab 05/26/13 1114 05/26/13 1750 05/27/13 0642 05/30/13 0545  WBC 8.5 8.5 7.3 8.8  NEUTROABS 7.1 6.3  --   --   HGB 7.8* 7.5* 7.8* 7.0*  HCT 23.6* 22.8* 23.6* 22.1*  MCV 80.5 81.7 81.9 84.0  PLT 250 228 222 172   Cardiac Enzymes: No results found for this basename: CKTOTAL, CKMB, CKMBINDEX, TROPONINI,  in the last 168 hours BNP (last 3 results) No results found for this basename: PROBNP,  in the last 8760 hours CBG:  Recent Labs Lab 05/30/13 0826 05/30/13 1118 05/30/13 2215 05/31/13 0823 05/31/13 1252  GLUCAP 79 77 102* 118* 139*    Recent Results (from the past 240 hour(s))  SURGICAL PCR SCREEN     Status: None   Collection Time    05/30/13  7:44 AM      Result Value Ref Range Status   MRSA, PCR NEGATIVE  NEGATIVE Final   Staphylococcus aureus NEGATIVE  NEGATIVE Final   Comment:            The Xpert SA Assay (FDA     approved for NASAL specimens     in patients over 33 years of age),     is one component of     a comprehensive surveillance     program.  Test performance has     been validated by [employer] for patients greater     than or equal to 60 year old.     It is not intended     to diagnose infection nor to     guide or monitor treatment.     Studies: Dg Fluoro Guide Cv Line-no Report  05/30/2013   CLINICAL DATA: Diatek placement   FLOURO GUIDE CV LINE  Fluoroscopy was utilized by the requesting physician.  No radiographic  interpretation.     Scheduled Meds: . calcium acetate  1,334 mg Oral TID WC  . cloNIDine  0.1 mg Oral TID  . [START ON 06/05/2013] darbepoetin (ARANESP) injection - DIALYSIS  100 mcg Intravenous Q Mon-HD  . doxercalciferol  2 mcg Intravenous Once  . [START ON 06/01/2013] doxercalciferol  2 mcg Intravenous Q T,Th,Sa-HD  . heparin subcutaneous  5,000 Units Subcutaneous 3 times per day  . hydrALAZINE  100 mg Oral 3 times per day  . insulin aspart  0-9 Units Subcutaneous TID WC  .  insulin glargine  20 Units Subcutaneous QHS  . metoprolol succinate  100 mg Oral Daily  . multivitamin  1 tablet Oral QHS  . NIFEdipine  90 mg Oral Daily  . [START ON 06/01/2013] predniSONE  10 mg Oral Q breakfast  . simvastatin  20 mg Oral q1800  . sodium chloride  3 mL Intravenous Q12H  . sodium chloride  3 mL Intravenous Q12H   Continuous Infusions: . sodium chloride 20 mL/hr at 05/30/13 [phone]  Principal Problem:   CKD (chronic kidney disease), stage V Active Problems:   Diabetes mellitus with complication   Essential hypertension, benign   Obesity   Prostate cancer   Hyperlipidemia   Gout   Peripheral edema   Hyperkalemia   Anemia of chronic renal failure    Time spent: [facility] Pager [phone]. If 7PM-7AM, please contact night-coverage at [email or web address], password [facility] 05/31/2013, 5:06 PM  LOS: 5 days

## 2013-05-31 NOTE — Plan of Care (Signed)
Problem: Food- and Nutrition-Related Knowledge Deficit (NB-1.1) Goal: Nutrition education Formal process to instruct or train a patient/client in a skill or to impart knowledge to help patients/clients voluntarily manage or modify food choices and eating behavior to maintain or improve health. Outcome: Completed/Met Date Met:  05/31/13  Nutrition Education Note  RD consulted for Renal Education. Provided Choose-A-Meal Booklet to patient's wife. Reviewed food groups and provided written recommended serving sizes specifically determined for patient's current nutritional status.   Explained why diet restrictions are needed and provided lists of foods to limit/avoid that are high potassium, sodium, and phosphorus. Provided specific recommendations on safer alternatives of these foods. Strongly encouraged compliance of this diet.   Discussed importance of protein intake at each meal and snack. Provided examples of how to maximize protein intake throughout the day. Discussed need for fluid restriction with dialysis, importance of minimizing weight gain between HD treatments, and renal-friendly beverage options.  Encouraged pt to discuss specific diet questions/concerns with RD at HD outpatient facility. Teach back method used.  Expect fair compliance.  Body mass index is 30.86 kg/(m^2). Pt meets criteria for Obese Class I based on current BMI.  Current diet order is Clear Liquid. Labs and medications reviewed. No further nutrition interventions warranted at this time. RD contact information provided. If additional nutrition issues arise, please re-consult RD.  Inda Coke MS, RD, LDN Inpatient Registered Dietitian Pager: [phone] After-hours pager: [phone]

## 2013-05-31 NOTE — Telephone Encounter (Addendum)
Message copied by Gena Fray on Wed May 31, 2013 10:45 AM ------      Message from: Peter Minium K      Created: Wed May 31, 2013 10:05 AM      Regarding: schedule                   ----- Message -----         From: Gabriel Earing, PA-C         Sent: 05/31/2013  10:02 AM           To: [facility] Charge Pool            S/p 1st stage brachial vein transposition 05/30/13.  F/u with Dr. Bridgett Larsson in 6 weeks.  He does not need duplex.            Thanks,      Aldona Bar ------  05/31/13: lm for pt re appt, dpm

## 2013-06-01 LAB — RENAL FUNCTION PANEL
ALBUMIN: 2.4 g/dL — AB (ref 3.5–5.2)
BUN: 60 mg/dL — AB (ref 6–23)
CO2: 23 meq/L (ref 19–32)
CREATININE: 10.2 mg/dL — AB (ref 0.50–1.35)
Calcium: 7.6 mg/dL — ABNORMAL LOW (ref 8.4–10.5)
Chloride: 95 mEq/L — ABNORMAL LOW (ref 96–112)
GFR calc Af Amer: 6 mL/min — ABNORMAL LOW (ref >90)
GFR, EST NON AFRICAN AMERICAN: 5 mL/min — AB (ref >90)
Glucose, Bld: 182 mg/dL — ABNORMAL HIGH (ref 70–99)
Phosphorus: 8 mg/dL — ABNORMAL HIGH (ref 2.3–4.6)
Potassium: 5.1 mEq/L (ref 3.7–5.3)
Sodium: 137 mEq/L (ref 137–147)

## 2013-06-01 LAB — BASIC METABOLIC PANEL
BUN: 59 mg/dL — AB (ref 6–23)
CO2: 22 meq/L (ref 19–32)
CREATININE: 10.25 mg/dL — AB (ref 0.50–1.35)
Calcium: 7.7 mg/dL — ABNORMAL LOW (ref 8.4–10.5)
Chloride: 95 mEq/L — ABNORMAL LOW (ref 96–112)
GFR calc Af Amer: 6 mL/min — ABNORMAL LOW (ref >90)
GFR calc non Af Amer: 5 mL/min — ABNORMAL LOW (ref >90)
Glucose, Bld: 177 mg/dL — ABNORMAL HIGH (ref 70–99)
POTASSIUM: 5.1 meq/L (ref 3.7–5.3)
Sodium: 139 mEq/L (ref 137–147)

## 2013-06-01 LAB — CBC
HCT: 21.3 % — ABNORMAL LOW (ref 39.0–52.0)
HEMOGLOBIN: 6.9 g/dL — AB (ref 13.0–17.0)
MCH: 27.6 pg (ref 26.0–34.0)
MCHC: 32.4 g/dL (ref 30.0–36.0)
MCV: 85.2 fL (ref 78.0–100.0)
PLATELETS: 197 10*3/uL (ref 150–400)
RBC: 2.5 MIL/uL — AB (ref 4.22–5.81)
RDW: 15.2 % (ref 11.5–15.5)
WBC: 14.2 10*3/uL — ABNORMAL HIGH (ref 4.0–10.5)

## 2013-06-01 LAB — GLUCOSE, CAPILLARY
Glucose-Capillary: 142 mg/dL — ABNORMAL HIGH (ref 70–99)
Glucose-Capillary: 162 mg/dL — ABNORMAL HIGH (ref 70–99)
Glucose-Capillary: 234 mg/dL — ABNORMAL HIGH (ref 70–99)

## 2013-06-01 LAB — PREPARE RBC (CROSSMATCH)

## 2013-06-01 MED ORDER — ONDANSETRON HCL 4 MG/2ML IJ SOLN
INTRAMUSCULAR | Status: AC
  Administered 2013-06-01: 4 mg via INTRAVENOUS
  Filled 2013-06-01: qty 2

## 2013-06-01 MED ORDER — HEPARIN SODIUM (PORCINE) 1000 UNIT/ML DIALYSIS: 20.0000 [IU]/kg | INTRAMUSCULAR | Status: DC | PRN

## 2013-06-01 MED ORDER — HYDROMORPHONE HCL PF 1 MG/ML IJ SOLN
INTRAMUSCULAR | Status: AC
  Administered 2013-06-01: 1 mg via INTRAVENOUS
  Filled 2013-06-01: qty 1

## 2013-06-01 MED ORDER — HYDROMORPHONE HCL PF 1 MG/ML IJ SOLN
INTRAMUSCULAR | Status: AC
  Administered 2013-06-01: 0.5 mg via INTRAVENOUS
  Filled 2013-06-01: qty 1

## 2013-06-01 MED ORDER — LORATADINE 10 MG PO TABS
10.0000 mg | ORAL_TABLET | Freq: Every day | ORAL | Status: DC
  Administered 2013-06-01 – 2013-06-02 (×2): 10 mg via ORAL
  Filled 2013-06-01 (×3): qty 1

## 2013-06-01 MED ORDER — DOXERCALCIFEROL 4 MCG/2ML IV SOLN
INTRAVENOUS | Status: AC
  Administered 2013-06-01: 2 ug via INTRAVENOUS
  Filled 2013-06-01: qty 2

## 2013-06-01 MED ORDER — METOCLOPRAMIDE HCL 5 MG/ML IJ SOLN
5.0000 mg | Freq: Three times a day (TID) | INTRAMUSCULAR | Status: DC | PRN
  Administered 2013-06-01: 5 mg via INTRAVENOUS
  Filled 2013-06-01: qty 2

## 2013-06-01 NOTE — Progress Notes (Addendum)
[facility] PROGRESS NOTE  Bob Maldonado [id number] DOB: [birth year] DOA: 05/26/2013 PCP: Vic Blackbird, MD Brief summary.  This is a 58 year old African American gentleman with history of insulin-dependent type 2 diabetes mellitus, hypertension, dyslipidemia, ESRD who was refusing dialysis for the last several months, now developed shortness of breath and presented to the ER expressing his desire to be dialyzed, was seen by renal and vascular surgery, has now been initiated on dialysis through a right IJ dialysis catheter, initially had femoral access.   Assessment/Plan: 1. ESRD - Untreated but now desires treatment, renal onboard he underwent right femoral dialysis catheter placement by vascular surgery on 05/27/2013 and dialysis was initiated on the same date.  -Dialysis per renal -Still with nausea vomiting likely due to uremia, He is still on clear liquids at this time>> follow and advance as tolerated 2. Hyperkalemia- due to #1, resolved after Kayexalate and dialysis  3. SOB and Peripheral Edema due to #1, and Fluid Retention. Not requiring oxygen and symptom-free at rest, continue dialysis.  4. Anemia of chronic Disease and Renal Disease- our goal to keep hemoglobin above 7.  -Hemoglobin 6.9 this am from7.4 on 4/15, will transfuse the units already typed today in dialysis>> follow and recheck in am 5. Untreated Prostate Cancer-since 2014, pt to follow up for workup as outpatient with Oncology and or Urology.  6. DM2- A1c noted, morning CBG was low reduce Lantus, continue q. a.c. sliding scale  Lab Results   Component  Value  Date    HGBA1C  5.8*  05/27/2013    CBG (last 3)   Recent Labs   05/30/13 0406  05/30/13 0826  05/30/13 1118   GLUCAP  79  79  77    7. Essential HTN- Continue Nifedipine, Toprol, coontinue hydralazine, clonidine for better control, monitor blood pressure and adjust medications as needed.  8. Hyperlipidemia- Continue Statin Rx.  9. Gout-  improving, continue with quick taper of prednisone  10. Hypocalcemia due to untreated ESRD. Replaced IV again on 05/29/2013, placed on oral per renal.  Code Status: Full  Family Communication: Wife at bedside Disposition Plan: Home   Procedures by vascular surgery on 05/27/2013 right femoral dialysis catheter, hemodialysis initiated on 05-27-13, 05/30/2013 right IJ dialysis catheter placed. Femoral catheter removal today 4/15 Consults Renal, vascular surgery      Antibiotics:  None   HPI/Subjective:  right toe pain better, now on solids>> some nausea but better  Objective: Filed Vitals:   06/01/13 1729  BP: 176/76  Pulse: 87  Temp: 99.2 F (37.3 C)  Resp: 19    Intake/Output Summary (Last 24 hours) at 06/01/13 1845 Last data filed at 06/01/13 1300  Gross per 24 hour  Intake    970 ml  Output   1350 ml  Net   -380 ml   Filed Weights   05/30/13 2216 06/01/13 0749 06/01/13 1204  Weight: 112 kg (246 lb 14.6 oz) 112 kg (246 lb 14.6 oz) 110.5 kg (243 lb 9.7 oz)    Exam:  General: alert & oriented x 3 In NAD Cardiovascular: RRR, nl S1 s2 Respiratory: CTAB Abdomen: soft +BS NT/ND, no masses palpable Extremities: Tender right great toe, no edema, no cyanosis and no erythema    Data Reviewed: Basic Metabolic Panel:  Recent Labs Lab 05/26/13 1750 05/27/13 0642 05/30/13 0545 06/01/13 0500 06/01/13 0803  NA 143 145 141 139 137  K 5.5* 4.8 3.9 5.1 5.1  CL 100 102 99 95* 95*  CO2 16*  15* 23 22 23   GLUCOSE 102* 57* 76 177* 182*  BUN 150* 151* 68* 59* 60*  CREATININE 16.26* 16.20* 10.35* 10.25* 10.20*  CALCIUM 5.6* 5.5* 7.3* 7.7* 7.6*  PHOS  --  12.3*  --   --  8.0*   Liver Function Tests:  Recent Labs Lab 05/26/13 1114 05/26/13 1750 06/01/13 0803  AST 39* 37  --   ALT 24 22  --   ALKPHOS 64 68  --   BILITOT 0.3 <0.2*  --   PROT 6.0 6.5  --   ALBUMIN 3.3* 2.9* 2.4*   No results found for this basename: LIPASE, AMYLASE,  in the last 168 hours No  results found for this basename: AMMONIA,  in the last 168 hours CBC:  Recent Labs Lab 05/26/13 1114 05/26/13 1750 05/27/13 0642 05/30/13 0545 05/31/13 1725 06/01/13 0803  WBC 8.5 8.5 7.3 8.8  --  14.2*  NEUTROABS 7.1 6.3  --   --   --   --   HGB 7.8* 7.5* 7.8* 7.0* 7.4* 6.9*  HCT 23.6* 22.8* 23.6* 22.1* 24.0* 21.3*  MCV 80.5 81.7 81.9 84.0  --  85.2  PLT 250 228 222 172  --  197   Cardiac Enzymes: No results found for this basename: CKTOTAL, CKMB, CKMBINDEX, TROPONINI,  in the last 168 hours BNP (last 3 results) No results found for this basename: PROBNP,  in the last 8760 hours CBG:  Recent Labs Lab 05/31/13 0823 05/31/13 1252 05/31/13 1637 05/31/13 2126 06/01/13 1232  GLUCAP 118* 139* 213* 174* 142*    Recent Results (from the past 240 hour(s))  SURGICAL PCR SCREEN     Status: None   Collection Time    05/30/13  7:44 AM      Result Value Ref Range Status   MRSA, PCR NEGATIVE  NEGATIVE Final   Staphylococcus aureus NEGATIVE  NEGATIVE Final   Comment:            The Xpert SA Assay (FDA     approved for NASAL specimens     in patients over 5 years of age),     is one component of     a comprehensive surveillance     program.  Test performance has     been validated by [employer] for patients greater     than or equal to 70 year old.     It is not intended     to diagnose infection nor to     guide or monitor treatment.     Studies: No results found.  Scheduled Meds: . calcium acetate  1,334 mg Oral TID WC  . cloNIDine  0.1 mg Oral TID  . [START ON 06/05/2013] darbepoetin (ARANESP) injection - DIALYSIS  100 mcg Intravenous Q Mon-HD  . doxercalciferol  2 mcg Intravenous Q T,Th,Sa-HD  . heparin subcutaneous  5,000 Units Subcutaneous 3 times per day  . hydrALAZINE  100 mg Oral 3 times per day  . insulin aspart  0-9 Units Subcutaneous TID WC  . insulin glargine  20 Units Subcutaneous QHS  . loratadine  10 mg Oral Daily  . metoprolol succinate  100  mg Oral Daily  . multivitamin  1 tablet Oral QHS  . NIFEdipine  90 mg Oral Daily  . predniSONE  10 mg Oral Q breakfast  . simvastatin  20 mg Oral q1800  . sodium chloride  3 mL Intravenous Q12H  . sodium chloride  3  mL Intravenous Q12H   Continuous Infusions: . sodium chloride 20 mL/hr at 05/30/13 [id number]    Principal Problem:   CKD (chronic kidney disease), stage V Active Problems:   Diabetes mellitus with complication   Essential hypertension, benign   Obesity   Prostate cancer   Hyperlipidemia   Gout   Peripheral edema   Hyperkalemia   Anemia of chronic renal failure    Time spent: [facility] Pager [phone]. If 7PM-7AM, please contact night-coverage at [email or web address], password [facility] 06/01/2013, 6:45 PM  LOS: 6 days

## 2013-06-01 NOTE — Procedures (Signed)
I was present at this dialysis session. I have reviewed the session itself and made appropriate changes.   Pt with chair THS 2nd shift at [facility] on [address].  Can rec treatment there on Saturday IF pt can arrive on Friday to sign paperwork.  Pt with persistent N/V.  Has hx/o DM x>10y and reports some DR and ? Neuropathy.  Will givn trial of reglan for ?gastroparesis.  Pt with Hb 6.9.  On ESA.  Will transfuse 2uPRBC in HD  Having stuffiness, likely seasonal allergies. Rx loratadine 10mg  daily.  Pearson Grippe  MD 06/01/2013, 8:33 AM

## 2013-06-01 NOTE — Evaluation (Signed)
Physical Therapy Evaluation Patient Details Name: Bob Maldonado MRN: [id number] DOB: 1955-08-01 Today's Date: 06/01/2013   History of Present Illness  pt with untreated CKD and prostate CA admitted with SOB and bil LE edema with initiaion of HD  Clinical Impression  Pt very slow to mobilize taking 20 min to progress from supine to sitting due to deferring mobility due to nausea even though he was stating he would get up. Once up able to walk and perform stairs with need for steadying assist. Pt with decreased strength and balance and would benefit from acute therapy to maximize strength and balance prior to return home with wife. Pt encouraged to be OOB for all meals and to walk again today with nursing as well as perform HEP during day.     Follow Up Recommendations No PT follow up    Equipment Recommendations  Rolling walker with 5" wheels (potential need for RW if balance not improved before DC)    Recommendations for Other Services       Precautions / Restrictions Precautions Precautions: Fall      Mobility  Bed Mobility Overal bed mobility: Modified Independent                Transfers Overall transfer level: Modified independent                  Ambulation/Gait Ambulation/Gait assistance: Min guard Ambulation Distance (Feet): 300 Feet Assistive device: Rolling walker (2 wheeled);None Gait Pattern/deviations: Step-through pattern;Decreased stride length   Gait velocity interpretation: Below normal speed for age/gender General Gait Details: pt walked 250' with RW with steady gait and slow speed. Removed the Rw for last 87' with pt with unsteady gait and partial LOB x 2 with assist to correct position  Stairs            Wheelchair Mobility    Modified Rankin (Stroke Patients Only)       Balance                                             Pertinent Vitals/Pain No pain Nausea with zofran received    Home Living  Family/patient expects to be discharged to:: Private residence Living Arrangements: Spouse/significant other Available Help at Discharge: Family;Available 24 hours/day Type of Home: House Home Access: Stairs to enter Entrance Stairs-Rails: None Entrance Stairs-Number of Steps: 2 Home Layout: Laundry or work area in basement;Two level;Able to live on main level with bedroom/bathroom Home Equipment: None      Prior Function Level of Independence: Independent               Hand Dominance        Extremity/Trunk Assessment   Upper Extremity Assessment: Overall WFL for tasks assessed           Lower Extremity Assessment: Generalized weakness (bil LE 4/5 with hip flexion, knee flexion, knee extension)      Cervical / Trunk Assessment: Normal  Communication   Communication: No difficulties  Cognition Arousal/Alertness: Awake/alert Behavior During Therapy: WFL for tasks assessed/performed Overall Cognitive Status: Within Functional Limits for tasks assessed                      General Comments      Exercises General Exercises - Lower Extremity Long Arc Quad: AROM;Seated;Both;10 reps Hip Flexion/Marching: AROM;Seated;Both;10 reps  Assessment/Plan    PT Assessment Patient needs continued PT services  PT Diagnosis Difficulty walking   PT Problem List Decreased strength;Decreased activity tolerance;Decreased balance;Decreased knowledge of use of DME  PT Treatment Interventions Gait training;DME instruction;Balance training;Therapeutic exercise;Patient/family education;Therapeutic activities   PT Goals (Current goals can be found in the Care Plan section) Acute Rehab PT Goals Patient Stated Goal: return home PT Goal Formulation: With patient/family Time For Goal Achievement: 06/08/13 Potential to Achieve Goals: Good    Frequency Min 3X/week   Barriers to discharge        Co-evaluation               End of Session Equipment Utilized  During Treatment: Gait belt Activity Tolerance: Patient tolerated treatment well Patient left: in bed;with call bell/phone within reach;with family/visitor present Nurse Communication: Mobility status         Time: 1352-1431 PT Time Calculation (min): 39 min   Charges:   PT Evaluation $Initial PT Evaluation Tier I: 1 Procedure PT Treatments $Therapeutic Exercise: 8-22 mins   PT G Codes:          Yarimar Lavis B Devyn Griffing 06/01/2013, 2:44 PM Elwyn Reach, [place]

## 2013-06-01 NOTE — Progress Notes (Signed)
PT Cancellation Note  Patient Details Name: Bob Maldonado MRN: [id number] DOB: 1955-06-24   Cancelled Treatment:    Reason Eval/Treat Not Completed: Patient at procedure or test/unavailable (pt currently in HD and unavailable)   Emerick Weatherly B Collie Kittel 06/01/2013, 11:32 AM Elwyn Reach, [place]

## 2013-06-01 NOTE — Progress Notes (Signed)
CRITICAL VALUE ALERT  Critical value received:  Hgb 6.9  Date of notification:  06/01/2013  Time of notification: 0830  Critical value read back:yes  Nurse who received alert:  Soledad Gerlach  MD notified (1st page):  A. Viyuoh   Time of first page:  0831  MD notified (2nd page):  Time of second page:  Responding MD:  A. Viyuoh  Time MD responded:  [phone]

## 2013-06-01 NOTE — Progress Notes (Signed)
Nutrition Brief Note  RD received second consult to complete HD nutrition education. RD completed full diet education with wife yesterday, 4/15. RD returned to wife today and she denies any questions/concerns at this time.  No nutrition interventions warranted at this time. If nutrition issues arise, please consult RD.   Inda Coke MS, RD, LDN Inpatient Registered Dietitian Pager: [phone] After-hours pager: [phone]

## 2013-06-02 DIAGNOSIS — N186 End stage renal disease: Secondary | ICD-10-CM

## 2013-06-02 LAB — BASIC METABOLIC PANEL
BUN: 41 mg/dL — AB (ref 6–23)
CHLORIDE: 96 meq/L (ref 96–112)
CO2: 26 mEq/L (ref 19–32)
Calcium: 8 mg/dL — ABNORMAL LOW (ref 8.4–10.5)
Creatinine, Ser: 7.17 mg/dL — ABNORMAL HIGH (ref 0.50–1.35)
GFR calc non Af Amer: 7 mL/min — ABNORMAL LOW (ref >90)
GFR, EST AFRICAN AMERICAN: 9 mL/min — AB (ref >90)
Glucose, Bld: 191 mg/dL — ABNORMAL HIGH (ref 70–99)
Potassium: 4.3 mEq/L (ref 3.7–5.3)
Sodium: 137 mEq/L (ref 137–147)

## 2013-06-02 LAB — CBC
HEMATOCRIT: 26.1 % — AB (ref 39.0–52.0)
Hemoglobin: 8.4 g/dL — ABNORMAL LOW (ref 13.0–17.0)
MCH: 27.5 pg (ref 26.0–34.0)
MCHC: 32.2 g/dL (ref 30.0–36.0)
MCV: 85.3 fL (ref 78.0–100.0)
Platelets: 180 10*3/uL (ref 150–400)
RBC: 3.06 MIL/uL — ABNORMAL LOW (ref 4.22–5.81)
RDW: 16 % — AB (ref 11.5–15.5)
WBC: 10.1 10*3/uL (ref 4.0–10.5)

## 2013-06-02 LAB — TYPE AND SCREEN
ABO/RH(D): B POS
Antibody Screen: NEGATIVE
UNIT DIVISION: 0
UNIT DIVISION: 0

## 2013-06-02 LAB — GLUCOSE, CAPILLARY: Glucose-Capillary: 131 mg/dL — ABNORMAL HIGH (ref 70–99)

## 2013-06-02 MED ORDER — HYDRALAZINE HCL 100 MG PO TABS: 100.0000 mg | ORAL_TABLET | Freq: Three times a day (TID) | ORAL | Status: DC

## 2013-06-02 MED ORDER — OXYCODONE HCL 5 MG PO TABS: 5.0000 mg | ORAL_TABLET | ORAL | Status: DC | PRN

## 2013-06-02 MED ORDER — CALCIUM ACETATE 667 MG PO CAPS: 1334.0000 mg | ORAL_CAPSULE | Freq: Three times a day (TID) | ORAL | Status: DC

## 2013-06-02 MED ORDER — CLONIDINE HCL 0.1 MG PO TABS: 0.1000 mg | ORAL_TABLET | Freq: Three times a day (TID) | ORAL | Status: DC

## 2013-06-02 NOTE — Progress Notes (Signed)
Physical Therapy Treatment Patient Details Name: Bob Maldonado MRN: [id number] DOB: Jun 17, 1955 Today's Date: 06/17/2013    History of Present Illness pt with untreated CKD and prostate CA admitted with SOB and bil LE edema with initiaion of HD    PT Comments    Patient reluctant to use walker, but feels better with cane.  Needs HHPT for LE strength and balance HEP.    Follow Up Recommendations  Home health PT     Equipment Recommendations  Cane    Recommendations for Other Services       Precautions / Restrictions Precautions Precautions: Fall    Mobility  Bed Mobility Overal bed mobility: Modified Independent                Transfers Overall transfer level: Modified independent                  Ambulation/Gait Ambulation/Gait assistance: Supervision Ambulation Distance (Feet): 200 Feet Assistive device: Straight cane Gait Pattern/deviations: Step-through pattern;Decreased stride length     General Gait Details: self selected sequence was appropriate.  mild unsteadiness with head turns, but no loss of balance   Stairs            Wheelchair Mobility    Modified Rankin (Stroke Patients Only)       Balance Overall balance assessment: Needs assistance           Standing balance-Leahy Scale: Fair Standing balance comment: with ambulation needs at least 1 UE assist                    Cognition Arousal/Alertness: Awake/alert Behavior During Therapy: WFL for tasks assessed/performed Overall Cognitive Status: Within Functional Limits for tasks assessed                      Exercises      General Comments        Pertinent Vitals/Pain No pain complaints    Home Living                      Prior Function            PT Goals (current goals can now be found in the care plan section) Progress towards PT goals: Progressing toward goals    Frequency       PT Plan Discharge plan needs to be  updated;Equipment recommendations need to be updated    Co-evaluation             End of Session Equipment Utilized During Treatment: Gait belt Activity Tolerance: Patient tolerated treatment well Patient left: in bed;with call bell/phone within reach;with family/visitor present     Time: 0912-0938 PT Time Calculation (min): 26 min  Charges:  $Gait Training: 8-22 mins $Self Care/Home Management: 8-22                    G Codes:      Max Sane 2013/06/17, 12:54 PM Magda Kiel, [place] Jun 17, 2013

## 2013-06-02 NOTE — Discharge Summary (Signed)
Physician Discharge Summary  Bob Maldonado [id number] DOB: 18-May-1955 DOA: 05/26/2013  PCP: Vic Blackbird, MD  Admit date: 05/26/2013 Discharge date: 06/02/2013  Time spent: >30 minutes  Recommendations for Outpatient Follow-up:  Follow-up Information   Follow up with Hinda Lenis, MD In 6 weeks. (Office will call you to arrange your appt (sent))    Specialty:  Vascular Surgery   Contact information:   [address] [place] [address] [phone]       Follow up with Vic Blackbird, MD. (call for appt uon discharge)    Specialty:  Family Medicine   Contact information:   [address] [place] [address] [phone]       Please follow up. (Dialysis center today as scheduled and then Renal/dialysis as directed)       Please follow up. (?Dr June Leap in [place] for Prostate cancer follow up- call for appt upon discharge)        Discharge Diagnoses:  Principal Problem:   CKD (chronic kidney disease), stage V Active Problems:   Diabetes mellitus with complication   Essential hypertension, benign   Obesity   Prostate cancer   Hyperlipidemia   Gout   Peripheral edema   Hyperkalemia   Anemia of chronic renal failure   Discharge Condition: improved/stable  Diet recommendation: renal diet as instructed  Filed Weights   06/01/13 0749 06/01/13 1204 06/01/13 2134  Weight: 112 kg (246 lb 14.6 oz) 110.5 kg (243 lb 9.7 oz) 112.1 kg (247 lb 2.2 oz)    History of present illness:  HPI: Bob Maldonado is a 58 y.o. male with a history Untreated CKD Stge V and Prostate Cancer since 03/2012, and DM2, HTN, who presents to the ED after he had labs performed which returned with abnormalities. He has been having worsening SOB and edema of his BLEs over the past month. He denies Chest Pain.    Hospital Course:  1. ESRD - as discussed above, was previously untreated on admission was agreeable to treatment, renal was consulted,>>consulted Vascular for  right femoral dialysis catheter placement and this was done on 05/27/2013 and dialysis was initiated on the same date.  -Vascular followed and on 4/14 a right IJ catheter was placed dialysis -Renal followed and analyzed patient in the hospital and is uremia improved -He was then started on clears as his nausea vomiting improved and he tolerated well and was advanced to solids. - Renal followed and he was assigned an outpatient dialysis center and is medically ready for discharge today for outpatient followup  2. Hyperkalemia- due to #1, resolved after Kayexalate and dialysis  3. SOB and Peripheral Edema due to #1, and Fluid Retention. Not requiring oxygen and symptom-free at rest, continue dialysis.  4. Anemia of chronic Disease and Renal Disease -Hemoglobin 6.9 on 4/16 from7.4 on 4/15, with no gross evidence of bleeding-he was transfused 2 units PRBCs on 4/16 and dialysis and his hemoglobin on followup today is 8.4. -He is to followup with renal outpatient. 5. Untreated Prostate Cancer-since 2014, pt to follow up for workup as outpatient with Oncology and or Urology.  I discussed outpatient followup with patient and his wife and he prefers to followup with Dr.? Brendia Sacks who diagnosed him initially  6. DM2- A1c noted,  he was placed on the lower dose of his Lantus while in the hospital given his poor by mouth intake. His Accu-Cheks were monitored and he was covered with sliding scale insulin. With his overall improvement  is tolerating by mouth's well and is to continue his outpatient Lantus upon discharge.  Lab Results   Component  Value  Date    HGBA1C  5.8*  05/27/2013   CBG (last 3)  Recent Labs   05/30/13 0406  05/30/13 0826  05/30/13 1118   GLUCAP  79  79  77   7.  uncontrolled Essential HTN-  he was maintained on  Nifedipine, Toprol,  but because his blood pressures remained difficult to control hydralazine and clonidine were added and his blood pressure control is much improved  at this time. He is to continue  these meds upon discharge and followup with outpatient MDs for further monitoring and adjustment of his meds as clinically appropriate for optimal blood pressure control.  8. Hyperlipidemia- Continue Statin Rx.  9. Gout-was treated with quick taper of prednisone  in the hospital and resolved he'll not require any further prednisone upon discharge. . 10. Hypocalcemia due to untreated ESRD. Replaced IV again on 05/29/2013, placed on oral per renal.   Procedures: -by vascular surgery on 05/27/2013 right femoral dialysis catheter,  -hemodialysis initiated on 05-27-13,  -05/30/2013 right IJ dialysis catheter placed.  -Femoral catheter removal today 4/15   Consultations:  Renal  Vascular surgery   Discharge Exam: Filed Vitals:   06/02/13 0833  BP: 135/61  Pulse: 76  Temp: 99.1 F (37.3 C)  Resp: 19   Exam:  General: alert & oriented x 3 In NAD  Cardiovascular: RRR, nl S1 s2  Respiratory: CTAB  Abdomen: soft +BS NT/ND, no masses palpable  Extremities: non tender right great toe, no edema, no cyanosis and no erythema    Discharge Instructions You were cared for by a hospitalist during your hospital stay. If you have any questions about your discharge medications or the care you received while you were in the hospital after you are discharged, you can call the unit and asked to speak with the hospitalist on call if the hospitalist that took care of you is not available. Once you are discharged, your primary care physician will handle any further medical issues. Please note that NO REFILLS for any discharge medications will be authorized once you are discharged, as it is imperative that you return to your primary care physician (or establish a relationship with a primary care physician if you do not have one) for your aftercare needs so that they can reassess your need for medications and monitor your lab values.  Discharge Orders   Future Appointments  Provider Department Dept Phone   07/21/2013 9:15 AM Conrad Lorena, MD [facility] [phone]   Future Orders Complete By Expires   Diet renal 60/70-03-20-1198  As directed    Increase activity slowly  As directed        Medication List    STOP taking these medications       furosemide 40 MG tablet  Commonly known as:  LASIX      TAKE these medications       calcium acetate 667 MG capsule  Commonly known as:  PHOSLO  Take 2 capsules (1,334 mg total) by mouth 3 (three) times daily with meals.     cloNIDine 0.1 MG tablet  Commonly known as:  CATAPRES  Take 1 tablet (0.1 mg total) by mouth 3 (three) times daily.     hydrALAZINE 100 MG tablet  Commonly known as:  APRESOLINE  Take 1 tablet (100 mg total) by mouth every 8 (eight) hours.  Insulin Glargine 100 UNIT/ML Solostar Pen  Commonly known as:  LANTUS SOLOSTAR  Inject 30 Units into the skin at bedtime.     metoprolol succinate 100 MG 24 hr tablet  Commonly known as:  TOPROL XL  Take 1 tablet (100 mg total) by mouth daily. Take with or immediately following a meal.     NIFEdipine 90 MG 24 hr tablet  Commonly known as:  ADALAT CC  Take 1 tablet (90 mg total) by mouth daily.     oxyCODONE 5 MG immediate release tablet  Commonly known as:  Oxy IR/ROXICODONE  Take 1 tablet (5 mg total) by mouth every 4 (four) hours as needed for moderate pain.     pravastatin 40 MG tablet  Commonly known as:  PRAVACHOL  Take 1 tablet (40 mg total) by mouth daily.       No Known Allergies     Follow-up Information   Follow up with Hinda Lenis, MD In 6 weeks. (Office will call you to arrange your appt (sent))    Specialty:  Vascular Surgery   Contact information:   [address] [place] [address] [phone]       Follow up with Vic Blackbird, MD. (call for appt uon discharge)    Specialty:  Family Medicine   Contact information:   [address] [place]  [address] [phone]       Please follow up. (Dialysis center today as scheduled and then Renal/dialysis as directed)        The results of significant diagnostics from this hospitalization (including imaging, microbiology, ancillary and laboratory) are listed below for reference.    Significant Diagnostic Studies: Dg Chest 2 View  05/26/2013   CLINICAL DATA:  Shortness of breath, end-stage renal disease, and prostate cancer.  EXAM: CHEST  2 VIEW  COMPARISON:  06/01/2005  FINDINGS: The cardiac silhouette is upper limits of normal in size. There is mildly increased elevation the right hemidiaphragm. There is question of a new, small right pleural effusion. There is a well-circumscribed density in the posterior lung base on the lateral image which partially obscures the right hemidiaphragm. The left lung is clear. No pneumothorax is identified.  IMPRESSION: 1. Possible small right pleural effusion. 2. Well circumscribed opacity in the posterior right lung base, query Bochdalek's hernia.   Electronically Signed   By: Logan Bores   On: 05/26/2013 14:24   Dg Fluoro Guide Cv Line-no Report  05/30/2013   CLINICAL DATA: Diatek placement   FLOURO GUIDE CV LINE  Fluoroscopy was utilized by the requesting physician.  No radiographic  interpretation.     Microbiology: Recent Results (from the past 240 hour(s))  SURGICAL PCR SCREEN     Status: None   Collection Time    05/30/13  7:44 AM      Result Value Ref Range Status   MRSA, PCR NEGATIVE  NEGATIVE Final   Staphylococcus aureus NEGATIVE  NEGATIVE Final   Comment:            The Xpert SA Assay (FDA     approved for NASAL specimens     in patients over 54 years of age),     is one component of     a comprehensive surveillance     program.  Test performance has     been validated by [employer] for patients greater     than or equal to 56 year old.  It is not intended     to diagnose infection nor to     guide or monitor treatment.      Labs: Basic Metabolic Panel:  Recent Labs Lab 05/26/13 1750 05/27/13 0642 05/30/13 0545 06/01/13 0500 06/01/13 0803 06/02/13 0620  NA 143 145 141 139 137 137  K 5.5* 4.8 3.9 5.1 5.1 4.3  CL 100 102 99 95* 95* 96  CO2 16* 15* 23 22 23 26   GLUCOSE 102* 57* 76 177* 182* 191*  BUN 150* 151* 68* 59* 60* 41*  CREATININE 16.26* 16.20* 10.35* 10.25* 10.20* 7.17*  CALCIUM 5.6* 5.5* 7.3* 7.7* 7.6* 8.0*  PHOS  --  12.3*  --   --  8.0*  --    Liver Function Tests:  Recent Labs Lab 05/26/13 1114 05/26/13 1750 06/01/13 0803  AST 39* 37  --   ALT 24 22  --   ALKPHOS 64 68  --   BILITOT 0.3 <0.2*  --   PROT 6.0 6.5  --   ALBUMIN 3.3* 2.9* 2.4*   No results found for this basename: LIPASE, AMYLASE,  in the last 168 hours No results found for this basename: AMMONIA,  in the last 168 hours CBC:  Recent Labs Lab 05/26/13 1114 05/26/13 1750 05/27/13 0642 05/30/13 0545 05/31/13 1725 06/01/13 0803 06/02/13 0620  WBC 8.5 8.5 7.3 8.8  --  14.2* 10.1  NEUTROABS 7.1 6.3  --   --   --   --   --   HGB 7.8* 7.5* 7.8* 7.0* 7.4* 6.9* 8.4*  HCT 23.6* 22.8* 23.6* 22.1* 24.0* 21.3* 26.1*  MCV 80.5 81.7 81.9 84.0  --  85.2 85.3  PLT 250 228 222 172  --  197 180   Cardiac Enzymes: No results found for this basename: CKTOTAL, CKMB, CKMBINDEX, TROPONINI,  in the last 168 hours BNP: BNP (last 3 results) No results found for this basename: PROBNP,  in the last 8760 hours CBG:  Recent Labs Lab 05/31/13 2126 06/01/13 1232 06/01/13 1728 06/01/13 2101 06/02/13 0831  GLUCAP 174* 142* 162* 234* 131*       Signed:  Aracelis Ulrey C Jamieson Hetland  [facility] 06/02/2013, 10:08 AM

## 2013-06-02 NOTE — Progress Notes (Signed)
Admit: 05/26/2013 LOS: 7  62M new start ESRD / CKD-5HD  Subjective:  HD uneventful yesterday Feels better, eating regular meals w/ nausea/early satitiey Ready to go home To go to [facility] today for paperwork, HD tomorrown [facility] 2nd shift Questions answered   04/16 0701 - 04/17 0700 In: 1030 [P.O.:360; Blood:670] Out: 1000   Filed Weights   06/01/13 0749 06/01/13 1204 06/01/13 2134  Weight: 112 kg (246 lb 14.6 oz) 110.5 kg (243 lb 9.7 oz) 112.1 kg (247 lb 2.2 oz)    Current meds: reviewed PhosLo 2qAC, Aranesp 40 qMon, Hectorol 2qTx Current Labs: reviewed     Physical Exam:  Blood pressure 135/61, pulse 76, temperature 99.1 F (37.3 C), temperature source Oral, resp. rate 19, height 6\' 3"  (1.905 m), weight 112.1 kg (247 lb 2.2 oz), SpO2 97.00%. NAD RRR, no rub Bibasilar crackles 2+ LEE Nonfocal, no asterixis S/NT/ND  Assessment 1. New ESRD (Start 4/11) 2. Anemia, Fe and B12 replete 3. 2HPTH 4. Hyperphosphatemia 5. HTN 6. S/pt R IJ TDC and 1st stage BVT 05/30/13 7. Prostate Cancer  Plan 1. HD as outpt [facility] 2. Ready for d/c  Pearson Grippe MD 06/02/2013, 10:49 AM   Recent Labs Lab 05/26/13 1750 05/27/13 0642  06/01/13 0500 06/01/13 0803 06/02/13 0620  NA 143 145  < > 139 137 137  K 5.5* 4.8  < > 5.1 5.1 4.3  CL 100 102  < > 95* 95* 96  CO2 16* 15*  < > 22 23 26   GLUCOSE 102* 57*  < > 177* 182* 191*  BUN 150* 151*  < > 59* 60* 41*  CREATININE 16.26* 16.20*  < > 10.25* 10.20* 7.17*  CALCIUM 5.6* 5.5*  < > 7.7* 7.6* 8.0*  PHOS  --  12.3*  --   --  8.0*  --   < > = values in this interval not displayed.  Recent Labs Lab 05/26/13 1114 05/26/13 1750  05/30/13 0545 05/31/13 1725 06/01/13 0803 06/02/13 0620  WBC 8.5 8.5  < > 8.8  --  14.2* 10.1  NEUTROABS 7.1 6.3  --   --   --   --   --   HGB 7.8* 7.5*  < > 7.0* 7.4* 6.9* 8.4*  HCT 23.6* 22.8*  < > 22.1* 24.0* 21.3* 26.1*  MCV 80.5 81.7  < > 84.0  --  85.2 85.3  PLT 250 228  < > 172  --  197 180  < > =  values in this interval not displayed.

## 2013-06-13 ENCOUNTER — Ambulatory Visit (INDEPENDENT_AMBULATORY_CARE_PROVIDER_SITE_OTHER): Payer: BC Managed Care – PPO | Admitting: Family Medicine

## 2013-06-13 VITALS — BP 136/68 | HR 86 | Temp 98.4°F | Resp 18 | Ht 75.0 in | Wt 238.0 lb

## 2013-06-13 DIAGNOSIS — E118 Type 2 diabetes mellitus with unspecified complications: Secondary | ICD-10-CM

## 2013-06-13 DIAGNOSIS — R5381 Other malaise: Secondary | ICD-10-CM

## 2013-06-13 DIAGNOSIS — R5383 Other fatigue: Secondary | ICD-10-CM

## 2013-06-13 DIAGNOSIS — C61 Malignant neoplasm of prostate: Secondary | ICD-10-CM

## 2013-06-13 DIAGNOSIS — N186 End stage renal disease: Secondary | ICD-10-CM

## 2013-06-13 DIAGNOSIS — I10 Essential (primary) hypertension: Secondary | ICD-10-CM

## 2013-06-13 DIAGNOSIS — R531 Weakness: Secondary | ICD-10-CM

## 2013-06-13 DIAGNOSIS — Z992 Dependence on renal dialysis: Principal | ICD-10-CM

## 2013-06-13 MED ORDER — INSULIN GLARGINE 100 UNIT/ML SOLOSTAR PEN: 20.0000 [IU] | PEN_INJECTOR | Freq: Every day | SUBCUTANEOUS | Status: DC

## 2013-06-13 MED ORDER — NIFEDIPINE ER 90 MG PO TB24: 90.0000 mg | ORAL_TABLET | Freq: Every day | ORAL | Status: DC

## 2013-06-13 MED ORDER — METOPROLOL SUCCINATE ER 100 MG PO TB24: 100.0000 mg | ORAL_TABLET | Freq: Every day | ORAL | Status: DC

## 2013-06-13 MED ORDER — PRAVASTATIN SODIUM 40 MG PO TABS: 40.0000 mg | ORAL_TABLET | Freq: Every day | ORAL | Status: DC

## 2013-06-13 NOTE — Patient Instructions (Signed)
Decrease lantus to 20units Continue all other medications Fax or bring by any forms Referral to podiatry Referral to urology  F/U 2 months

## 2013-06-14 ENCOUNTER — Encounter: Payer: Self-pay | Admitting: Family Medicine

## 2013-06-14 DIAGNOSIS — Z992 Dependence on renal dialysis: Principal | ICD-10-CM

## 2013-06-14 DIAGNOSIS — N186 End stage renal disease: Secondary | ICD-10-CM | POA: Insufficient documentation

## 2013-06-14 NOTE — Assessment & Plan Note (Signed)
BP looks okay, continue current medications

## 2013-06-14 NOTE — Assessment & Plan Note (Signed)
He is untreated prostate cancer but now that he is on hemodialysis he was to have a consultation so that he can see there are any options for him. His PSA returned 2 weeks ago at 8

## 2013-06-14 NOTE — Assessment & Plan Note (Signed)
Secondary to his chronic medical problems as well as starting hemodialysis. He has had a short-term handicap sticker. He is currently in physical therapy.

## 2013-06-14 NOTE — Assessment & Plan Note (Signed)
He has some evidence of diabetic neuropathy. He will be referred to podiatry for nail trimming. I will decrease his Lantus to 20 units he's been overcorrected especially in the setting of his end-stage renal disease.

## 2013-06-14 NOTE — Progress Notes (Signed)
Patient ID: Bob Maldonado, male   DOB: 28-Mar-1955, 58 y.o.   MRN: [id number]   Subjective:    Patient ID: Bob Maldonado, male    DOB: 1955-02-18, 58 y.o.   MRN: [id number]  Patient presents for Hospital F/U  is here to follow [facility] secondary to end-stage renal disease he is now on hemodialysis 3 days a week Tuesday Thursday Saturday. He's doing fairly well with dialysis. He would like to make arrangements to see the [facility] regarding his untreated prostate cancer and see what options he has now that he is getting treatment for his renal disease.  Diabetes mellitus-A1c returned at 5.8% he is currently taking Lantus 30 units he's not had any hypoglycemia recently. He also requests referral to podiatrist for nail trimming be done, he has noticed numbness in his toes but this is been present for greater than 6 months.  He is currently on short-term disability for his job and request that I completed paperwork for long-term disability with this comes through.  [facility] he is getting physical therapy 2 times a week for the next 3 weeks and he is doing well with this    Review Of Systems:  GEN- denies fatigue, fever, weight loss,weakness, recent illness HEENT- denies eye drainage, change in vision, nasal discharge, CVS- denies chest pain, palpitations RESP- denies SOB, cough, wheeze ABD- denies N/V, change in stools, abd pain GU- denies dysuria, hematuria, dribbling, incontinence MSK- denies joint pain, muscle aches, injury Neuro- denies headache, dizziness, syncope, seizure activity       Objective:    BP 136/68  Pulse 86  Temp(Src) 98.4 F (36.9 C) (Oral)  Resp 18  Ht 6\' 3"  (1.905 m)  Wt 238 lb (107.956 kg)  BMI 29.75 kg/m2 GEN- NAD, alert and oriented x3,weight loss of 40lbs since HD started HEENT- PERRL, EOMI, non injected sclera, pink conjunctiva, MMM, oropharynx clear CVS- RRR, soft systolic murmur 2/6 RESP- CTAB EXT- trace Edema Pulses- Radial 2+,  DP- decreased Bilat         Assessment & Plan:      Problem List Items Addressed This Visit   None      Note: This dictation was prepared with Dragon dictation along with smaller phrase technology. Any transcriptional errors that result from this process are unintentional.

## 2013-06-22 ENCOUNTER — Other Ambulatory Visit: Payer: Self-pay | Admitting: *Deleted

## 2013-06-22 MED ORDER — PRAVASTATIN SODIUM 40 MG PO TABS: 40.0000 mg | ORAL_TABLET | Freq: Every day | ORAL | Status: DC

## 2013-06-22 MED ORDER — METOPROLOL SUCCINATE ER 100 MG PO TB24: 100.0000 mg | ORAL_TABLET | Freq: Every day | ORAL | Status: DC

## 2013-06-22 NOTE — Telephone Encounter (Signed)
Refill appropriate and filled per protocol. 

## 2013-07-18 ENCOUNTER — Other Ambulatory Visit: Payer: Self-pay | Admitting: Urology

## 2013-07-18 ENCOUNTER — Institutional Professional Consult (permissible substitution) (INDEPENDENT_AMBULATORY_CARE_PROVIDER_SITE_OTHER): Payer: BC Managed Care – PPO | Admitting: Urology

## 2013-07-18 DIAGNOSIS — C61 Malignant neoplasm of prostate: Secondary | ICD-10-CM

## 2013-07-20 ENCOUNTER — Encounter: Payer: Self-pay | Admitting: Vascular Surgery

## 2013-07-21 ENCOUNTER — Encounter: Payer: Self-pay | Admitting: Vascular Surgery

## 2013-07-21 ENCOUNTER — Ambulatory Visit (INDEPENDENT_AMBULATORY_CARE_PROVIDER_SITE_OTHER): Payer: Self-pay | Admitting: Vascular Surgery

## 2013-07-21 ENCOUNTER — Other Ambulatory Visit: Payer: Self-pay | Admitting: Urology

## 2013-07-21 ENCOUNTER — Other Ambulatory Visit: Payer: Self-pay

## 2013-07-21 VITALS — BP 120/55 | HR 67 | Ht 75.0 in | Wt 232.0 lb

## 2013-07-21 DIAGNOSIS — C61 Malignant neoplasm of prostate: Secondary | ICD-10-CM

## 2013-07-21 DIAGNOSIS — N186 End stage renal disease: Secondary | ICD-10-CM

## 2013-07-21 NOTE — Progress Notes (Signed)
Postoperative Access Visit   History of Present Illness  Bob Maldonado is a 58 y.o. year old male who presents for postoperative follow-up for: L 1st BRVT (Date: 05/30/13).  The patient's wounds are healed.  The patient notes no steal symptoms.  The patient is able to complete their activities of daily living.  The patient's current symptoms are: none.  Past Medical History  Diagnosis Date  . Diabetes mellitus without complication   . Hypertension   . Hyperlipidemia   . Gout   . Cancer     prostate / Declines treatment  . Chronic kidney disease     ESRD- Declines Dialysis    Past Surgical History  Procedure Laterality Date  . Right knee  1985  . Insertion of dialysis catheter Right 05/30/2013    Procedure: INSERTION OF DIALYSIS CATHETER;  Surgeon: Conrad Kenedy, MD;  Location: [facility];  Service: Vascular;  Laterality: Right;  . Av fistula placement Left 05/30/2013    Procedure: Brachial vein transposition 1st stage;  Surgeon: Conrad Parshall, MD;  Location: [facility];  Service: Vascular;  Laterality: Left;    History   Social History  . Marital Status: Single    Spouse Name: N/A    Number of Children: N/A  . Years of Education: N/A   Occupational History  . Not on file.   Social History Main Topics  . Smoking status: Former Research scientist (life sciences)  . Smokeless tobacco: Never Used     Comment: quit 25 years prior  . Alcohol Use: No     Comment: rare  . Drug Use: No  . Sexual Activity: Yes   Other Topics Concern  . Not on file   Social History Narrative  . No narrative on file    Family History  Problem Relation Age of Onset  . Diabetes Mother   . Heart disease Mother   . Cancer Father   . Heart disease Brother     Current Outpatient Prescriptions on File Prior to Visit  Medication Sig Dispense Refill  . calcium acetate (PHOSLO) 667 MG capsule Take 2 capsules (1,334 mg total) by mouth 3 (three) times daily with meals.  180 capsule  0  . cloNIDine (CATAPRES) 0.1 MG tablet  Take 1 tablet (0.1 mg total) by mouth 3 (three) times daily.  90 tablet  0  . hydrALAZINE (APRESOLINE) 100 MG tablet Take 1 tablet (100 mg total) by mouth every 8 (eight) hours.  90 tablet  0  . Insulin Glargine (LANTUS) 100 UNIT/ML Solostar Pen Inject 20 Units into the skin at bedtime.  20 mL  3  . metoprolol succinate (TOPROL XL) 100 MG 24 hr tablet Take 1 tablet (100 mg total) by mouth daily. Take with or immediately following a meal.  90 tablet  2  . pravastatin (PRAVACHOL) 40 MG tablet Take 1 tablet (40 mg total) by mouth daily.  90 tablet  2  . NIFEdipine (ADALAT CC) 90 MG 24 hr tablet Take 1 tablet (90 mg total) by mouth daily.  90 tablet  2  . oxyCODONE (OXY IR/ROXICODONE) 5 MG immediate release tablet Take 1 tablet (5 mg total) by mouth every 4 (four) hours as needed for moderate pain.  30 tablet  0  . predniSONE (DELTASONE) 10 MG tablet Take 10 mg by mouth daily with breakfast.       No current facility-administered medications on file prior to visit.    No Known Allergies  REVIEW OF SYSTEMS:  (  Positives checked otherwise negative)  CARDIOVASCULAR:  []  chest pain, []  chest pressure, []  palpitations, []  shortness of breath when laying flat, []  shortness of breath with exertion,  []  pain in feet when walking, []  pain in feet when laying flat, []  history of blood clot in veins (DVT), []  history of phlebitis, []  swelling in legs, []  varicose veins  PULMONARY:  []  productive cough, []  asthma, []  wheezing  NEUROLOGIC:  []  weakness in arms or legs, []  numbness in arms or legs, []  difficulty speaking or slurred speech, []  temporary loss of vision in one eye, []  dizziness  HEMATOLOGIC:  []  bleeding problems, []  problems with blood clotting too easily  MUSCULOSKEL:  []  joint pain, []  joint swelling  GASTROINTEST:  []  vomiting blood, []  blood in stool     GENITOURINARY:  []  burning with urination, []  blood in urine  PSYCHIATRIC:  []  history of major depression  INTEGUMENTARY:  []   rashes, []  ulcers      For VQI Use Only  PRE-ADM LIVING: Home  AMB STATUS: Ambulatory  Physical Examination Filed Vitals:   07/21/13 0920  BP: 120/55  Pulse: 67   Pulmonary: Sym exp, good air movt, CTAB, no rales, rhonchi, & wheezing  Cardiac: RRR, Nl S1, S2, no Murmurs, rubs or gallops  LUE: Incision is healed, skin feels warm, hand grip is 5/5, sensation in digits is intact, palpable thrill, bruit can be auscultated , fistula > 6 mm throughout  Medical Decision Making  Bob Maldonado is a 58 y.o. year old male who presents s/p L 1st BRVT.  The patient will be scheduled for the 2nd stage, i.e. Transposition, portion of the operation this coming 9 JUN 15.  Risk, benefits, and alternatives to access surgery were discussed.  The patient is aware the risks include but are not limited to: bleeding, infection, steal syndrome, nerve damage, ischemic monomelic neuropathy, failure to mature, need for additional procedures, death and stroke.    The patient agrees to proceed forward with the procedure.  Thank you for allowing [place] to participate in this patient's care.  Adele Barthel, MD [facility] Office: [phone] Pager: [phone]  07/21/2013, 9:40 AM

## 2013-07-24 ENCOUNTER — Encounter (HOSPITAL_COMMUNITY): Payer: Self-pay | Admitting: *Deleted

## 2013-07-24 MED ORDER — DEXTROSE 5 % IV SOLN
1.5000 g | INTRAVENOUS | Status: AC
  Administered 2013-07-25: 1.5 g via INTRAVENOUS
  Filled 2013-07-24: qty 1.5

## 2013-07-24 NOTE — Progress Notes (Signed)
07/24/13 1753  OBSTRUCTIVE SLEEP APNEA  Have you ever been diagnosed with sleep apnea through a sleep study? No  Do you snore loudly (loud enough to be heard through closed doors)?  1  Do you often feel tired, fatigued, or sleepy during the daytime? 1  Has anyone observed you stop breathing during your sleep? 0  Do you have, or are you being treated for high blood pressure? 1  BMI more than 35 kg/m2? 0  Age over 59 years old? 1  Gender: 1  Obstructive Sleep Apnea Score 5

## 2013-07-25 ENCOUNTER — Encounter (HOSPITAL_COMMUNITY)
Admission: RE | Disposition: A | Discharge status: Home / Self Care | Payer: Self-pay | Source: Ambulatory Visit | Attending: Vascular Surgery

## 2013-07-25 ENCOUNTER — Ambulatory Visit (HOSPITAL_COMMUNITY): Payer: BC Managed Care – PPO | Admitting: Anesthesiology

## 2013-07-25 ENCOUNTER — Ambulatory Visit (HOSPITAL_COMMUNITY)
Admission: RE | Admit: 2013-07-25 | Discharge: 2013-07-25 | Disposition: A | Discharge status: Home / Self Care | Payer: BC Managed Care – PPO | Source: Ambulatory Visit | Attending: Vascular Surgery | Admitting: Vascular Surgery

## 2013-07-25 ENCOUNTER — Encounter (HOSPITAL_COMMUNITY): Payer: BC Managed Care – PPO | Admitting: Anesthesiology

## 2013-07-25 ENCOUNTER — Encounter (HOSPITAL_COMMUNITY): Payer: Self-pay | Admitting: *Deleted

## 2013-07-25 ENCOUNTER — Other Ambulatory Visit: Payer: Self-pay | Admitting: *Deleted

## 2013-07-25 DIAGNOSIS — N185 Chronic kidney disease, stage 5: Secondary | ICD-10-CM

## 2013-07-25 DIAGNOSIS — N186 End stage renal disease: Secondary | ICD-10-CM

## 2013-07-25 DIAGNOSIS — Z794 Long term (current) use of insulin: Secondary | ICD-10-CM | POA: Insufficient documentation

## 2013-07-25 DIAGNOSIS — M109 Gout, unspecified: Secondary | ICD-10-CM | POA: Insufficient documentation

## 2013-07-25 DIAGNOSIS — E785 Hyperlipidemia, unspecified: Secondary | ICD-10-CM | POA: Insufficient documentation

## 2013-07-25 DIAGNOSIS — Z4931 Encounter for adequacy testing for hemodialysis: Secondary | ICD-10-CM

## 2013-07-25 DIAGNOSIS — E119 Type 2 diabetes mellitus without complications: Secondary | ICD-10-CM | POA: Insufficient documentation

## 2013-07-25 DIAGNOSIS — Z87891 Personal history of nicotine dependence: Secondary | ICD-10-CM | POA: Insufficient documentation

## 2013-07-25 DIAGNOSIS — I120 Hypertensive chronic kidney disease with stage 5 chronic kidney disease or end stage renal disease: Secondary | ICD-10-CM | POA: Insufficient documentation

## 2013-07-25 DIAGNOSIS — Z79899 Other long term (current) drug therapy: Secondary | ICD-10-CM | POA: Insufficient documentation

## 2013-07-25 HISTORY — PX: BASCILIC VEIN TRANSPOSITION: SHX5742

## 2013-07-25 LAB — POCT I-STAT 4, (NA,K, GLUC, HGB,HCT)
Glucose, Bld: 107 mg/dL — ABNORMAL HIGH (ref 70–99)
HEMATOCRIT: 39 % (ref 39.0–52.0)
HEMOGLOBIN: 13.3 g/dL (ref 13.0–17.0)
Potassium: 3.7 mEq/L (ref 3.7–5.3)
Sodium: 140 mEq/L (ref 137–147)

## 2013-07-25 LAB — GLUCOSE, CAPILLARY
GLUCOSE-CAPILLARY: 78 mg/dL (ref 70–99)
GLUCOSE-CAPILLARY: 85 mg/dL (ref 70–99)
Glucose-Capillary: 113 mg/dL — ABNORMAL HIGH (ref 70–99)

## 2013-07-25 SURGERY — TRANSPOSITION, VEIN, BASILIC
Anesthesia: Monitor Anesthesia Care | Site: Arm Upper | Laterality: Left

## 2013-07-25 MED ORDER — PHENYLEPHRINE HCL 10 MG/ML IJ SOLN
INTRAMUSCULAR | Status: AC
  Filled 2013-07-25: qty 1

## 2013-07-25 MED ORDER — OXYCODONE HCL 5 MG PO TABS
ORAL_TABLET | ORAL | Status: AC
  Filled 2013-07-25: qty 1

## 2013-07-25 MED ORDER — PROPOFOL 10 MG/ML IV BOLUS
INTRAVENOUS | Status: AC
  Filled 2013-07-25: qty 20

## 2013-07-25 MED ORDER — PROPOFOL 10 MG/ML IV BOLUS
INTRAVENOUS | Status: DC | PRN
  Administered 2013-07-25: 200 mg via INTRAVENOUS

## 2013-07-25 MED ORDER — SODIUM CHLORIDE 0.9 % IR SOLN
Status: DC | PRN
  Administered 2013-07-25: 07:00:00

## 2013-07-25 MED ORDER — HYDROMORPHONE HCL PF 1 MG/ML IJ SOLN
INTRAMUSCULAR | Status: AC
  Filled 2013-07-25: qty 1

## 2013-07-25 MED ORDER — MIDAZOLAM HCL 2 MG/2ML IJ SOLN
INTRAMUSCULAR | Status: AC
  Filled 2013-07-25: qty 2

## 2013-07-25 MED ORDER — PHENYLEPHRINE 40 MCG/ML (10ML) SYRINGE FOR IV PUSH (FOR BLOOD PRESSURE SUPPORT)
PREFILLED_SYRINGE | INTRAVENOUS | Status: AC
  Filled 2013-07-25: qty 10

## 2013-07-25 MED ORDER — CHLORHEXIDINE GLUCONATE CLOTH 2 % EX PADS: 6.0000 | MEDICATED_PAD | Freq: Once | CUTANEOUS | Status: DC

## 2013-07-25 MED ORDER — OXYCODONE HCL 5 MG PO TABS
5.0000 mg | ORAL_TABLET | Freq: Once | ORAL | Status: AC
  Administered 2013-07-25: 5 mg via ORAL

## 2013-07-25 MED ORDER — 0.9 % SODIUM CHLORIDE (POUR BTL) OPTIME
TOPICAL | Status: DC | PRN
  Administered 2013-07-25: 1000 mL

## 2013-07-25 MED ORDER — STERILE WATER FOR INJECTION IJ SOLN
INTRAMUSCULAR | Status: AC
  Filled 2013-07-25: qty 10

## 2013-07-25 MED ORDER — LIDOCAINE HCL (CARDIAC) 10 MG/ML IV SOLN
INTRAVENOUS | Status: DC | PRN
  Administered 2013-07-25: 100 mg via INTRAVENOUS

## 2013-07-25 MED ORDER — SODIUM CHLORIDE 0.9 % IV SOLN: INTRAVENOUS | Status: DC

## 2013-07-25 MED ORDER — FENTANYL CITRATE 0.05 MG/ML IJ SOLN
INTRAMUSCULAR | Status: AC
  Filled 2013-07-25: qty 5

## 2013-07-25 MED ORDER — EPHEDRINE SULFATE 50 MG/ML IJ SOLN
INTRAMUSCULAR | Status: DC | PRN
  Administered 2013-07-25: 10 mg via INTRAVENOUS
  Administered 2013-07-25 (×2): 5 mg via INTRAVENOUS

## 2013-07-25 MED ORDER — PHENYLEPHRINE HCL 10 MG/ML IJ SOLN
INTRAMUSCULAR | Status: DC | PRN
  Administered 2013-07-25: 120 ug via INTRAVENOUS
  Administered 2013-07-25 (×2): 80 ug via INTRAVENOUS
  Administered 2013-07-25: 120 ug via INTRAVENOUS

## 2013-07-25 MED ORDER — SODIUM CHLORIDE 0.9 % IV SOLN
INTRAVENOUS | Status: DC | PRN
  Administered 2013-07-25: 07:00:00 via INTRAVENOUS

## 2013-07-25 MED ORDER — PHENYLEPHRINE HCL 10 MG/ML IJ SOLN
10.0000 mg | INTRAMUSCULAR | Status: DC | PRN
  Administered 2013-07-25: 20 ug/min via INTRAVENOUS

## 2013-07-25 MED ORDER — HYDROMORPHONE HCL PF 1 MG/ML IJ SOLN
0.2500 mg | INTRAMUSCULAR | Status: DC | PRN
  Administered 2013-07-25 (×2): 0.5 mg via INTRAVENOUS

## 2013-07-25 MED ORDER — EPHEDRINE SULFATE 50 MG/ML IJ SOLN
INTRAMUSCULAR | Status: AC
  Filled 2013-07-25: qty 1

## 2013-07-25 MED ORDER — FENTANYL CITRATE 0.05 MG/ML IJ SOLN
INTRAMUSCULAR | Status: DC | PRN
  Administered 2013-07-25 (×2): 25 ug via INTRAVENOUS
  Administered 2013-07-25: 100 ug via INTRAVENOUS

## 2013-07-25 MED ORDER — OXYCODONE-ACETAMINOPHEN 5-325 MG PO TABS: 1.0000 | ORAL_TABLET | Freq: Four times a day (QID) | ORAL | Status: DC | PRN

## 2013-07-25 MED ORDER — THROMBIN 20000 UNITS EX SOLR
CUTANEOUS | Status: DC | PRN
  Administered 2013-07-25 (×2): via TOPICAL

## 2013-07-25 MED ORDER — MIDAZOLAM HCL 5 MG/5ML IJ SOLN
INTRAMUSCULAR | Status: DC | PRN
  Administered 2013-07-25: 2 mg via INTRAVENOUS

## 2013-07-25 MED ORDER — THROMBIN 20000 UNITS EX SOLR
CUTANEOUS | Status: AC
  Filled 2013-07-25: qty 40000

## 2013-07-25 SURGICAL SUPPLY — 49 items
ADH SKN CLS APL DERMABOND .7 (GAUZE/BANDAGES/DRESSINGS) ×1
BLADE 10 SAFETY STRL DISP (BLADE) ×2 IMPLANT
CANISTER SUCTION 2500CC (MISCELLANEOUS) ×2 IMPLANT
CLIP TI MEDIUM 24 (CLIP) ×2 IMPLANT
CLIP TI WIDE RED SMALL 24 (CLIP) ×2 IMPLANT
CORDS BIPOLAR (ELECTRODE) IMPLANT
COVER PROBE W GEL 5X96 (DRAPES) IMPLANT
COVER SURGICAL LIGHT HANDLE (MISCELLANEOUS) ×2 IMPLANT
DECANTER SPIKE VIAL GLASS SM (MISCELLANEOUS) ×2 IMPLANT
DERMABOND ADVANCED (GAUZE/BANDAGES/DRESSINGS) ×1
DERMABOND ADVANCED .7 DNX12 (GAUZE/BANDAGES/DRESSINGS) ×1 IMPLANT
ELECT REM PT RETURN 9FT ADLT (ELECTROSURGICAL) ×2
ELECTRODE REM PT RTRN 9FT ADLT (ELECTROSURGICAL) ×1 IMPLANT
GLOVE BIO SURGEON STRL SZ7 (GLOVE) ×2 IMPLANT
GLOVE BIOGEL PI IND STRL 6.5 (GLOVE) ×2 IMPLANT
GLOVE BIOGEL PI IND STRL 7.0 (GLOVE) ×3 IMPLANT
GLOVE BIOGEL PI IND STRL 7.5 (GLOVE) ×2 IMPLANT
GLOVE BIOGEL PI INDICATOR 6.5 (GLOVE) ×2
GLOVE BIOGEL PI INDICATOR 7.0 (GLOVE) ×3
GLOVE BIOGEL PI INDICATOR 7.5 (GLOVE) ×2
GLOVE ECLIPSE 6.0 STRL STRAW (GLOVE) ×2 IMPLANT
GLOVE SS BIOGEL STRL SZ 7 (GLOVE) ×1 IMPLANT
GLOVE SUPERSENSE BIOGEL SZ 7 (GLOVE) ×1
GOWN STRL REUS W/ TWL LRG LVL3 (GOWN DISPOSABLE) ×3 IMPLANT
GOWN STRL REUS W/ TWL XL LVL3 (GOWN DISPOSABLE) ×2 IMPLANT
GOWN STRL REUS W/TWL LRG LVL3 (GOWN DISPOSABLE) ×6
GOWN STRL REUS W/TWL XL LVL3 (GOWN DISPOSABLE) ×2
KIT BASIN OR (CUSTOM PROCEDURE TRAY) ×2 IMPLANT
KIT ROOM TURNOVER OR (KITS) ×2 IMPLANT
NS IRRIG 1000ML POUR BTL (IV SOLUTION) ×2 IMPLANT
PACK CV ACCESS (CUSTOM PROCEDURE TRAY) ×2 IMPLANT
PAD ARMBOARD 7.5X6 YLW CONV (MISCELLANEOUS) ×4 IMPLANT
SPONGE SURGIFOAM ABS GEL 100 (HEMOSTASIS) ×4 IMPLANT
SUT MNCRL AB 4-0 PS2 18 (SUTURE) ×4 IMPLANT
SUT PROLENE 6 0 BV (SUTURE) ×2 IMPLANT
SUT PROLENE 7 0 BV 1 (SUTURE) IMPLANT
SUT SILK 2 0 SH (SUTURE) ×2 IMPLANT
SUT SILK 3 0 (SUTURE) ×1
SUT SILK 3-0 18XBRD TIE 12 (SUTURE) ×1 IMPLANT
SUT SILK 4 0 (SUTURE) ×2
SUT SILK 4-0 18XBRD TIE 12 (SUTURE) ×1 IMPLANT
SUT VIC AB 2-0 CT1 27 (SUTURE) ×4
SUT VIC AB 2-0 CT1 TAPERPNT 27 (SUTURE) ×2 IMPLANT
SUT VIC AB 3-0 SH 27 (SUTURE) ×4
SUT VIC AB 3-0 SH 27X BRD (SUTURE) ×2 IMPLANT
TOWEL OR 17X24 6PK STRL BLUE (TOWEL DISPOSABLE) ×2 IMPLANT
TOWEL OR 17X26 10 PK STRL BLUE (TOWEL DISPOSABLE) ×2 IMPLANT
UNDERPAD 30X30 INCONTINENT (UNDERPADS AND DIAPERS) ×2 IMPLANT
WATER STERILE IRR 1000ML POUR (IV SOLUTION) ×2 IMPLANT

## 2013-07-25 NOTE — Transfer of Care (Signed)
Immediate Anesthesia Transfer of Care Note  Patient: Bob Maldonado  Procedure(s) Performed: Procedure(s): SECOND STAGE BRACHIAL VEIN TRANSPOSITION (Left)  Patient Location: PACU  Anesthesia Type:General  Level of Consciousness: awake and patient cooperative  Airway & Oxygen Therapy: Patient Spontanous Breathing  Post-op Assessment: Report given to PACU RN, Post -op Vital signs reviewed and stable and Patient moving all extremities X 4  Post vital signs: Reviewed and stable  Complications: No apparent anesthesia complications

## 2013-07-25 NOTE — Op Note (Signed)
    OPERATIVE NOTE   PROCEDURE: left second stage brachial vein transposition (brachiobrachial arteriovenous fistula) placement  PRE-OPERATIVE DIAGNOSIS: successful left first stage brachial vein transposition   POST-OPERATIVE DIAGNOSIS: same as above   SURGEON: Adele Barthel, MD  ASSISTANT(S): Gerri Lins, PAC   ANESTHESIA: general  ESTIMATED BLOOD LOSS: 50 cc  FINDING(S): 1.  Fistula >6 mm throughout 2.  Palpable thrill at end of case  SPECIMEN(S):  none  INDICATIONS:   Bob Maldonado is a 58 y.o. male who presents with successful left first stage brachial vein transposition.  The patient is scheduled for left second stage brachial vein transposition.  The patient is aware the risks include but are not limited to: bleeding, infection, steal syndrome, nerve damage, ischemic monomelic neuropathy, failure to mature, and need for additional procedures.  The patient is aware of the risks of the procedure and elects to proceed forward.  DESCRIPTION: After full informed written consent was obtained from the patient, the patient was brought back to the operating room and placed supine upon the operating table.  Prior to induction, the patient received IV antibiotics.   After obtaining adequate anesthesia, the patient was then prepped and draped in the standard fashion for a left arm access procedure.  I turned my attention first to identifying the patient's brachiobrachial arteriovenous fistula.  Using SonoSite guidance, the location of this fistula was marked out on the skin.  I made an longitudinal incision over the fistula from its arterial anastomosis up to its axillary extent.  I carefully dissected the fistula away from its adjacent nerves.  Eventually the entirety of this fistula was mobilized and I dissected a plane on top of the bicipital fascia with electrocautery.  Also with electrocautery, I developed a curvilinear subcutaneous trough in which to transpose the fistula into.   The fistula was secured in its new location with loosely placed interrupt 3-0 Vicryl stitches, taking care to avoid compressing the fistula.  The deep subcutaneous tissue was inspected for bleeding.  Bleeding was controlled with electrocautery and placement of large pieces of thrombin and gelfoam.  I washed out the surgical site after waiting a few minutes, and there was no further bleeding.  The deep subcutaneous tissue was reapproximated with mattress sutures of 3-0 Vicryl to eliminate some of the dead space.  The superficial subcutaneous tissue was then reapproximated along the incision line with a running stitch of 3-0 Vicryl.  The skin was then reapproximated with a running subcuticular of 4-0 Monocryl.  The skin was then cleaned, dried, and reinforced with Dermabond.  The patient tolerated this procedure well.   COMPLICATIONS: none  CONDITION: stable  Adele Barthel, MD [facility] Office: [phone] Pager: [phone]  07/25/2013, 9:55 AM

## 2013-07-25 NOTE — Anesthesia Preprocedure Evaluation (Signed)
Anesthesia Evaluation  Patient identified by MRN, date of birth, ID band Patient awake    Reviewed: Allergy & Precautions, H&P , NPO status , Patient's Chart, lab work & pertinent test results  Airway Mallampati: II      Dental   Pulmonary former smoker,          Cardiovascular hypertension,     Neuro/Psych    GI/Hepatic Neg liver ROS,   Endo/Other  diabetes  Renal/GU Renal disease     Musculoskeletal   Abdominal   Peds  Hematology   Anesthesia Other Findings   Reproductive/Obstetrics                           Anesthesia Physical Anesthesia Plan  ASA: III  Anesthesia Plan: MAC   Post-op Pain Management:    Induction: Intravenous  Airway Management Planned: Simple Face Mask  Additional Equipment:   Intra-op Plan:   Post-operative Plan:   Informed Consent: I have reviewed the patients History and Physical, chart, labs and discussed the procedure including the risks, benefits and alternatives for the proposed anesthesia with the patient or authorized representative who has indicated his/her understanding and acceptance.   Dental advisory given  Plan Discussed with: CRNA and Anesthesiologist  Anesthesia Plan Comments:         Anesthesia Quick Evaluation

## 2013-07-25 NOTE — H&P (View-Only) (Signed)
Postoperative Access Visit   History of Present Illness  Bob Maldonado is a 58 y.o. year old male who presents for postoperative follow-up for: L 1st BRVT (Date: 05/30/13).  The patient's wounds are healed.  The patient notes no steal symptoms.  The patient is able to complete their activities of daily living.  The patient's current symptoms are: none.  Past Medical History  Diagnosis Date  . Diabetes mellitus without complication   . Hypertension   . Hyperlipidemia   . Gout   . Cancer     prostate / Declines treatment  . Chronic kidney disease     ESRD- Declines Dialysis    Past Surgical History  Procedure Laterality Date  . Right knee  1985  . Insertion of dialysis catheter Right 05/30/2013    Procedure: INSERTION OF DIALYSIS CATHETER;  Surgeon: Conrad [place], MD;  Location: [facility];  Service: Vascular;  Laterality: Right;  . Av fistula placement Left 05/30/2013    Procedure: Brachial vein transposition 1st stage;  Surgeon: Conrad Cody, MD;  Location: [facility];  Service: Vascular;  Laterality: Left;    History   Social History  . Marital Status: Single    Spouse Name: N/A    Number of Children: N/A  . Years of Education: N/A   Occupational History  . Not on file.   Social History Main Topics  . Smoking status: Former Research scientist (life sciences)  . Smokeless tobacco: Never Used     Comment: quit 25 years prior  . Alcohol Use: No     Comment: rare  . Drug Use: No  . Sexual Activity: Yes   Other Topics Concern  . Not on file   Social History Narrative  . No narrative on file    Family History  Problem Relation Age of Onset  . Diabetes Mother   . Heart disease Mother   . Cancer Father   . Heart disease Brother     Current Outpatient Prescriptions on File Prior to Visit  Medication Sig Dispense Refill  . calcium acetate (PHOSLO) 667 MG capsule Take 2 capsules (1,334 mg total) by mouth 3 (three) times daily with meals.  180 capsule  0  . cloNIDine (CATAPRES) 0.1 MG tablet  Take 1 tablet (0.1 mg total) by mouth 3 (three) times daily.  90 tablet  0  . hydrALAZINE (APRESOLINE) 100 MG tablet Take 1 tablet (100 mg total) by mouth every 8 (eight) hours.  90 tablet  0  . Insulin Glargine (LANTUS) 100 UNIT/ML Solostar Pen Inject 20 Units into the skin at bedtime.  20 mL  3  . metoprolol succinate (TOPROL XL) 100 MG 24 hr tablet Take 1 tablet (100 mg total) by mouth daily. Take with or immediately following a meal.  90 tablet  2  . pravastatin (PRAVACHOL) 40 MG tablet Take 1 tablet (40 mg total) by mouth daily.  90 tablet  2  . NIFEdipine (ADALAT CC) 90 MG 24 hr tablet Take 1 tablet (90 mg total) by mouth daily.  90 tablet  2  . oxyCODONE (OXY IR/ROXICODONE) 5 MG immediate release tablet Take 1 tablet (5 mg total) by mouth every 4 (four) hours as needed for moderate pain.  30 tablet  0  . predniSONE (DELTASONE) 10 MG tablet Take 10 mg by mouth daily with breakfast.       No current facility-administered medications on file prior to visit.    No Known Allergies  REVIEW OF SYSTEMS:  (  Positives checked otherwise negative)  CARDIOVASCULAR:  []  chest pain, []  chest pressure, []  palpitations, []  shortness of breath when laying flat, []  shortness of breath with exertion,  []  pain in feet when walking, []  pain in feet when laying flat, []  history of blood clot in veins (DVT), []  history of phlebitis, []  swelling in legs, []  varicose veins  PULMONARY:  []  productive cough, []  asthma, []  wheezing  NEUROLOGIC:  []  weakness in arms or legs, []  numbness in arms or legs, []  difficulty speaking or slurred speech, []  temporary loss of vision in one eye, []  dizziness  HEMATOLOGIC:  []  bleeding problems, []  problems with blood clotting too easily  MUSCULOSKEL:  []  joint pain, []  joint swelling  GASTROINTEST:  []  vomiting blood, []  blood in stool     GENITOURINARY:  []  burning with urination, []  blood in urine  PSYCHIATRIC:  []  history of major depression  INTEGUMENTARY:  []   rashes, []  ulcers      For VQI Use Only  PRE-ADM LIVING: Home  AMB STATUS: Ambulatory  Physical Examination Filed Vitals:   07/21/13 0920  BP: 120/55  Pulse: 67   Pulmonary: Sym exp, good air movt, CTAB, no rales, rhonchi, & wheezing  Cardiac: RRR, Nl S1, S2, no Murmurs, rubs or gallops  LUE: Incision is healed, skin feels warm, hand grip is 5/5, sensation in digits is intact, palpable thrill, bruit can be auscultated , fistula > 6 mm throughout  Medical Decision Making  Bob Maldonado is a 58 y.o. year old male who presents s/p L 1st BRVT.  The patient will be scheduled for the 2nd stage, i.e. Transposition, portion of the operation this coming 9 JUN 15.  Risk, benefits, and alternatives to access surgery were discussed.  The patient is aware the risks include but are not limited to: bleeding, infection, steal syndrome, nerve damage, ischemic monomelic neuropathy, failure to mature, need for additional procedures, death and stroke.    The patient agrees to proceed forward with the procedure.  Thank you for allowing [place] to participate in this patient's care.  Adele Barthel, MD [facility] Office: [phone] Pager: [phone]  07/21/2013, 9:40 AM

## 2013-07-25 NOTE — Interval H&P Note (Signed)
History and Physical Interval Note:  07/25/2013 7:22 AM  Bob Maldonado  has presented today for surgery, with the diagnosis of End stage renal disease  The various methods of treatment have been discussed with the patient and family. After consideration of risks, benefits and other options for treatment, the patient has consented to  Procedure(s): [place] (Left) as a surgical intervention .  The patient's history has been reviewed, patient examined, no change in status, stable for surgery.  I have reviewed the patient's chart and labs.  Questions were answered to the patient's satisfaction.     Conrad [place]

## 2013-07-25 NOTE — Discharge Instructions (Signed)

## 2013-07-25 NOTE — Anesthesia Postprocedure Evaluation (Signed)
Anesthesia Post Note  Patient: Bob Maldonado  Procedure(s) Performed: Procedure(s) (LRB): SECOND STAGE BRACHIAL VEIN TRANSPOSITION (Left)  Anesthesia type: General  Patient location: PACU  Post pain: Pain level controlled  Post assessment: Patient's Cardiovascular Status Stable  Last Vitals:  Filed Vitals:   07/25/13 1000  BP:   Pulse: 50  Temp:   Resp: 12    Post vital signs: Reviewed and stable  Level of consciousness: alert  Complications: No apparent anesthesia complications

## 2013-07-26 ENCOUNTER — Telehealth: Payer: Self-pay | Admitting: Vascular Surgery

## 2013-07-26 NOTE — Telephone Encounter (Addendum)
Message copied by Gena Fray on Wed Jul 26, 2013 10:48 AM ------      Message from: Mena Goes      Created: Tue Jul 25, 2013 10:40 AM      Regarding: Schedule                   ----- Message -----         From: Ulyses Amor, PA-C         Sent: 07/25/2013   9:53 AM           To: [facility] Charge Pool            F/U in 4 weeks with Dr. Bridgett Larsson s/p transposition of left upper arm AV Fistula. ------  07/26/13: lm for pt re appt, mailed letter, dpm

## 2013-07-27 ENCOUNTER — Encounter (HOSPITAL_COMMUNITY): Payer: Self-pay | Admitting: Vascular Surgery

## 2013-08-11 ENCOUNTER — Ambulatory Visit: Payer: BC Managed Care – PPO | Admitting: Family Medicine

## 2013-08-17 ENCOUNTER — Telehealth: Payer: Self-pay | Admitting: *Deleted

## 2013-08-17 MED ORDER — INSULIN GLARGINE 100 UNIT/ML SOLOSTAR PEN: 30.0000 [IU] | PEN_INJECTOR | Freq: Every day | SUBCUTANEOUS | Status: DC

## 2013-08-17 NOTE — Telephone Encounter (Signed)
Refill appropriate and filled per protocol. 

## 2013-08-22 ENCOUNTER — Other Ambulatory Visit: Payer: Self-pay | Admitting: Urology

## 2013-08-22 ENCOUNTER — Encounter (HOSPITAL_COMMUNITY): Payer: Self-pay

## 2013-08-22 ENCOUNTER — Encounter (HOSPITAL_COMMUNITY)
Admission: RE | Admit: 2013-08-22 | Discharge: 2013-08-22 | Disposition: A | Discharge status: Home / Self Care | Payer: BC Managed Care – PPO | Source: Ambulatory Visit | Attending: Urology | Admitting: Urology

## 2013-08-22 ENCOUNTER — Ambulatory Visit (HOSPITAL_COMMUNITY)
Admission: RE | Admit: 2013-08-22 | Discharge: 2013-08-22 | Disposition: A | Discharge status: Home / Self Care | Payer: BC Managed Care – PPO | Source: Ambulatory Visit | Attending: Urology | Admitting: Urology

## 2013-08-22 DIAGNOSIS — C61 Malignant neoplasm of prostate: Secondary | ICD-10-CM

## 2013-08-22 DIAGNOSIS — R937 Abnormal findings on diagnostic imaging of other parts of musculoskeletal system: Secondary | ICD-10-CM | POA: Insufficient documentation

## 2013-08-22 DIAGNOSIS — N186 End stage renal disease: Secondary | ICD-10-CM | POA: Insufficient documentation

## 2013-08-22 MED ORDER — SODIUM CHLORIDE 0.9 % IJ SOLN
INTRAMUSCULAR | Status: DC
Start: 2013-08-22 — End: 2013-08-28
  Filled 2013-08-22: qty 400

## 2013-08-22 MED ORDER — TECHNETIUM TC 99M MEDRONATE IV KIT
25.0000 | PACK | Freq: Once | INTRAVENOUS | Status: AC | PRN
  Administered 2013-08-22: 25 via INTRAVENOUS

## 2013-08-24 ENCOUNTER — Encounter: Payer: Self-pay | Admitting: Vascular Surgery

## 2013-08-25 ENCOUNTER — Encounter: Payer: BC Managed Care – PPO | Admitting: Vascular Surgery

## 2013-08-25 ENCOUNTER — Other Ambulatory Visit (HOSPITAL_COMMUNITY): Payer: BC Managed Care – PPO

## 2013-08-26 ENCOUNTER — Other Ambulatory Visit: Payer: Self-pay | Admitting: Family Medicine

## 2013-08-26 DIAGNOSIS — E785 Hyperlipidemia, unspecified: Secondary | ICD-10-CM

## 2013-08-26 DIAGNOSIS — N185 Chronic kidney disease, stage 5: Secondary | ICD-10-CM

## 2013-08-26 DIAGNOSIS — I10 Essential (primary) hypertension: Secondary | ICD-10-CM

## 2013-08-26 DIAGNOSIS — E119 Type 2 diabetes mellitus without complications: Secondary | ICD-10-CM

## 2013-08-26 DIAGNOSIS — E118 Type 2 diabetes mellitus with unspecified complications: Secondary | ICD-10-CM

## 2013-08-26 DIAGNOSIS — Z79899 Other long term (current) drug therapy: Secondary | ICD-10-CM

## 2013-08-28 ENCOUNTER — Other Ambulatory Visit: Payer: Self-pay | Admitting: Urology

## 2013-08-28 DIAGNOSIS — C61 Malignant neoplasm of prostate: Secondary | ICD-10-CM

## 2013-08-29 ENCOUNTER — Ambulatory Visit (HOSPITAL_COMMUNITY)
Admission: RE | Admit: 2013-08-29 | Discharge: 2013-08-29 | Disposition: A | Discharge status: Home / Self Care | Payer: BC Managed Care – PPO | Source: Ambulatory Visit | Attending: Urology | Admitting: Urology

## 2013-08-29 VITALS — BP 130/52 | HR 56 | Temp 97.7°F | Resp 16

## 2013-08-29 DIAGNOSIS — C61 Malignant neoplasm of prostate: Secondary | ICD-10-CM | POA: Diagnosis present

## 2013-08-29 MED ORDER — GENTAMICIN SULFATE 40 MG/ML IJ SOLN
160.0000 mg | Freq: Once | INTRAMUSCULAR | Status: AC
Start: 2013-08-29 — End: 2013-08-29
  Administered 2013-08-29: 160 mg via INTRAMUSCULAR

## 2013-08-29 MED ORDER — LIDOCAINE HCL (PF) 2 % IJ SOLN
10.0000 mL | Freq: Once | INTRAMUSCULAR | Status: AC
Start: 2013-08-29 — End: 2013-08-29
  Administered 2013-08-29: 10 mL

## 2013-08-29 NOTE — Discharge Instructions (Signed)
Transrectal Ultrasound-Guided Biopsy °A transrectal ultrasound-guided biopsy is a procedure to take samples of tissue from your prostate. Ultrasound images are used to guide the procedure. It is usually done to check the prostate gland for cancer. °BEFORE THE PROCEDURE °· Do not eat or drink after midnight on the night before your procedure. °· Take medicines as your doctor tells you. °· Your doctor may have you stop taking some medicines 5-7 days before the procedure. °· You will be given an enema before your procedure. During an enema, a liquid is put into your butt (rectum) to clear out waste. °· You may have lab tests the day of your procedure. °· Make plans to have someone drive you home. °PROCEDURE °· You will be given medicine to help you relax before the procedure. An IV tube will be put into one of your veins. It will be used to give fluids and medicine. °· You will be given medicine to reduce the risk of infection (antibiotic). °· You will be placed on your side. °· A probe with gel will be put in your butt. This is used to take pictures of your prostate and the area around it. °· A medicine to numb the area is put into your prostate. °· A biopsy needle is then inserted and guided to your prostate. °· Samples of prostate tissue are taken. The needle is removed. °· The samples are sent to a lab to be checked. Results are usually back in 2-3 days. °AFTER THE PROCEDURE °· You will be taken to a room where you will be watched until you are doing okay. °· You may have some pain in the area around your butt. You will be given medicines for this. °· You may be able to go home the same day. Sometimes, an overnight stay in the hospital is needed. °Document Released: 01/21/2009 Document Revised: 02/07/2013 Document Reviewed: 09/21/2012 °[employer]® Patient Information ©2015 [employer]. This information is not intended to replace advice given to you by your health care provider. Make sure you discuss any questions  you have with your health care provider. ° °

## 2013-08-31 ENCOUNTER — Other Ambulatory Visit: Payer: BC Managed Care – PPO

## 2013-08-31 DIAGNOSIS — E119 Type 2 diabetes mellitus without complications: Secondary | ICD-10-CM

## 2013-08-31 DIAGNOSIS — N185 Chronic kidney disease, stage 5: Secondary | ICD-10-CM

## 2013-08-31 DIAGNOSIS — Z79899 Other long term (current) drug therapy: Secondary | ICD-10-CM

## 2013-08-31 DIAGNOSIS — I10 Essential (primary) hypertension: Secondary | ICD-10-CM

## 2013-08-31 DIAGNOSIS — E785 Hyperlipidemia, unspecified: Secondary | ICD-10-CM

## 2013-08-31 DIAGNOSIS — E118 Type 2 diabetes mellitus with unspecified complications: Secondary | ICD-10-CM

## 2013-08-31 LAB — CBC WITH DIFFERENTIAL/PLATELET
Basophils Absolute: 0.1 10*3/uL (ref 0.0–0.1)
Basophils Relative: 2 % — ABNORMAL HIGH (ref 0–1)
EOS ABS: 0.2 10*3/uL (ref 0.0–0.7)
Eosinophils Relative: 4 % (ref 0–5)
HCT: 40 % (ref 39.0–52.0)
Hemoglobin: 13.5 g/dL (ref 13.0–17.0)
LYMPHS PCT: 17 % (ref 12–46)
Lymphs Abs: 0.8 10*3/uL (ref 0.7–4.0)
MCH: 27.4 pg (ref 26.0–34.0)
MCHC: 33.8 g/dL (ref 30.0–36.0)
MCV: 81.1 fL (ref 78.0–100.0)
Monocytes Absolute: 0.9 10*3/uL (ref 0.1–1.0)
Monocytes Relative: 19 % — ABNORMAL HIGH (ref 3–12)
NEUTROS PCT: 58 % (ref 43–77)
Neutro Abs: 2.8 10*3/uL (ref 1.7–7.7)
PLATELETS: 216 10*3/uL (ref 150–400)
RBC: 4.93 MIL/uL (ref 4.22–5.81)
RDW: 16.9 % — ABNORMAL HIGH (ref 11.5–15.5)
WBC: 4.9 10*3/uL (ref 4.0–10.5)

## 2013-08-31 LAB — HEMOGLOBIN A1C
Hgb A1c MFr Bld: 5.7 % — ABNORMAL HIGH (ref <5.7)
Mean Plasma Glucose: 117 mg/dL — ABNORMAL HIGH (ref <117)

## 2013-09-01 LAB — COMPREHENSIVE METABOLIC PANEL
ALT: 14 U/L (ref 0–53)
AST: 17 U/L (ref 0–37)
Albumin: 3.9 g/dL (ref 3.5–5.2)
Alkaline Phosphatase: 68 U/L (ref 39–117)
BILIRUBIN TOTAL: 0.6 mg/dL (ref 0.2–1.2)
BUN: 62 mg/dL — ABNORMAL HIGH (ref 6–23)
CO2: 27 mEq/L (ref 19–32)
CREATININE: 9.39 mg/dL — AB (ref 0.50–1.35)
Calcium: 9.5 mg/dL (ref 8.4–10.5)
Chloride: 98 mEq/L (ref 96–112)
Glucose, Bld: 69 mg/dL — ABNORMAL LOW (ref 70–99)
Potassium: 5 mEq/L (ref 3.5–5.3)
SODIUM: 138 meq/L (ref 135–145)
Total Protein: 6.4 g/dL (ref 6.0–8.3)

## 2013-09-01 LAB — LIPID PANEL
Cholesterol: 128 mg/dL (ref 0–200)
HDL: 52 mg/dL (ref >39)
LDL CALC: 61 mg/dL (ref 0–99)
TRIGLYCERIDES: 73 mg/dL (ref <150)
Total CHOL/HDL Ratio: 2.5 Ratio
VLDL: 15 mg/dL (ref 0–40)

## 2013-09-05 ENCOUNTER — Ambulatory Visit: Payer: BC Managed Care – PPO | Admitting: Urology

## 2013-09-05 ENCOUNTER — Encounter: Payer: Self-pay | Admitting: Family Medicine

## 2013-09-05 ENCOUNTER — Ambulatory Visit (INDEPENDENT_AMBULATORY_CARE_PROVIDER_SITE_OTHER): Payer: BC Managed Care – PPO | Admitting: Family Medicine

## 2013-09-05 VITALS — BP 130/68 | HR 68 | Temp 98.1°F | Resp 12 | Ht 75.0 in | Wt 229.0 lb

## 2013-09-05 DIAGNOSIS — E118 Type 2 diabetes mellitus with unspecified complications: Secondary | ICD-10-CM

## 2013-09-05 DIAGNOSIS — Z992 Dependence on renal dialysis: Secondary | ICD-10-CM

## 2013-09-05 DIAGNOSIS — E785 Hyperlipidemia, unspecified: Secondary | ICD-10-CM

## 2013-09-05 DIAGNOSIS — N186 End stage renal disease: Secondary | ICD-10-CM

## 2013-09-05 DIAGNOSIS — I10 Essential (primary) hypertension: Secondary | ICD-10-CM

## 2013-09-05 MED ORDER — PRAVASTATIN SODIUM 40 MG PO TABS: 40.0000 mg | ORAL_TABLET | Freq: Every day | ORAL | Status: DC

## 2013-09-05 MED ORDER — INSULIN GLARGINE 100 UNIT/ML SOLOSTAR PEN: 10.0000 [IU] | PEN_INJECTOR | Freq: Every day | SUBCUTANEOUS | Status: DC

## 2013-09-05 MED ORDER — CLONIDINE HCL 0.1 MG PO TABS: 0.1000 mg | ORAL_TABLET | Freq: Two times a day (BID) | ORAL | Status: DC

## 2013-09-05 MED ORDER — METOPROLOL SUCCINATE ER 100 MG PO TB24: 100.0000 mg | ORAL_TABLET | Freq: Every day | ORAL | Status: DC

## 2013-09-05 MED ORDER — HYDRALAZINE HCL 100 MG PO TABS: 100.0000 mg | ORAL_TABLET | Freq: Two times a day (BID) | ORAL | Status: DC

## 2013-09-05 MED ORDER — CALCIUM ACETATE 667 MG PO CAPS: 1334.0000 mg | ORAL_CAPSULE | Freq: Three times a day (TID) | ORAL | Status: DC

## 2013-09-05 MED ORDER — COLCHICINE 0.6 MG PO TABS: 0.6000 mg | ORAL_TABLET | Freq: Every day | ORAL | Status: DC

## 2013-09-05 NOTE — Patient Instructions (Signed)
Continue current medication Decrease lantus to 10 units F/U 3 months

## 2013-09-05 NOTE — Progress Notes (Signed)
Medication called to pharmacy. 

## 2013-09-05 NOTE — Progress Notes (Signed)
Patient ID: Bob Maldonado, male   DOB: 1956/01/08, 58 y.o.   MRN: [id number]   Subjective:    Patient ID: Bob Maldonado, male    DOB: 04/16/1955, 58 y.o.   MRN: [id number]  Patient presents for Medication Review    Patient here to follow chronic medical problems. He is history of diabetes mellitus end-stage renal disease on hemodialysis he also has prostate cancer. He was recently seen by urology and had a repeat biopsy which did confirm his prostate cancer he is followup this week to discuss whether or not he will have radiation and chemotherapy.  Diabetes mellitus his blood sugars fasting have been 60 to low 100s her last visit actually told him decrease his Lantus to 20 units but he did not do so because he does not always watch his diet. His A1c returned again today at 5.7%.  Cholesterol is within normal limits he's taken his pravastatin as prescribed. He has no new concerns today.    Review Of Systems:  GEN- denies fatigue, fever, weight loss,weakness, recent illness HEENT- denies eye drainage, change in vision, nasal discharge, CVS- denies chest pain, palpitations RESP- denies SOB, cough, wheeze ABD- denies N/V, change in stools, abd pain GU- denies dysuria, hematuria, dribbling, incontinence MSK- denies joint pain, muscle aches, injury Neuro- denies headache, dizziness, syncope, seizure activity       Objective:    BP 130/68  Pulse 68  Temp(Src) 98.1 F (36.7 C) (Oral)  Resp 12  Ht 6\' 3"  (1.905 m)  Wt 229 lb (103.874 kg)  BMI 28.62 kg/m2 GEN- NAD, alert and oriented x3 HEENT- PERRL, EOMI, non injected sclera, pink conjunctiva, MMM, oropharynx clear CVS- RRR, 2/6 SEM  RESP-CTAB EXT- No edema, LUE Fistula + bruit Pulses- Radial, DP- 2+        Assessment & Plan:      Problem List Items Addressed This Visit   None      Note: This dictation was prepared with Dragon dictation along with smaller phrase technology. Any transcriptional errors that result  from this process are unintentional.

## 2013-09-05 NOTE — Assessment & Plan Note (Signed)
Blood pressure is well-controlled no change the medication 

## 2013-09-05 NOTE — Assessment & Plan Note (Signed)
His A1c is at 5.7%. This is change in the setting of his end-stage renal disease. I advised him to decrease his Lantus to 10 units we may be able to stop the insulin altogether

## 2013-09-05 NOTE — Assessment & Plan Note (Signed)
LDL is at goal I will continue him on the current dose of pravastatin

## 2013-09-07 ENCOUNTER — Encounter: Payer: Self-pay | Admitting: Vascular Surgery

## 2013-09-08 ENCOUNTER — Ambulatory Visit (HOSPITAL_COMMUNITY)
Admission: RE | Admit: 2013-09-08 | Discharge: 2013-09-08 | Disposition: A | Discharge status: Home / Self Care | Payer: BC Managed Care – PPO | Source: Ambulatory Visit | Attending: Vascular Surgery | Admitting: Vascular Surgery

## 2013-09-08 ENCOUNTER — Ambulatory Visit (INDEPENDENT_AMBULATORY_CARE_PROVIDER_SITE_OTHER): Payer: Self-pay | Admitting: Vascular Surgery

## 2013-09-08 ENCOUNTER — Encounter: Payer: Self-pay | Admitting: Vascular Surgery

## 2013-09-08 VITALS — BP 126/56 | HR 61 | Ht 75.0 in | Wt 227.0 lb

## 2013-09-08 DIAGNOSIS — N186 End stage renal disease: Secondary | ICD-10-CM | POA: Insufficient documentation

## 2013-09-08 DIAGNOSIS — Z4931 Encounter for adequacy testing for hemodialysis: Secondary | ICD-10-CM

## 2013-09-08 NOTE — Progress Notes (Signed)
    Postoperative Access Visit   History of Present Illness  Bob Maldonado is a 58 y.o. year old male who presents for postoperative follow-up for: L 2nd BRVT (Date: 07/25/13).  The patient's wounds are healed.  The patient notes no steal symptoms.  The patient is able to complete their activities of daily living.  The patient's current symptoms are: none.  For VQI Use Only  PRE-ADM LIVING: Home  AMB STATUS: Ambulatory  Physical Examination Filed Vitals:   09/08/13 1501  BP: 126/56  Pulse: 61    LUE: Incision is healed, skin feels warm, hand grip is 5/5, sensation in digits is intact, palpable thrill, bruit can be auscultated , on Sonosite: fistula > 6 mm throughout  Medical Decision Making  Bob Maldonado is a 58 y.o. year old male who presents s/p L 2nd stage BRVT.  The patient's access is ready for use.  The patient's tunneled dialysis catheter can be removed after two successful cannulations and completed dialysis treatments.  Thank you for allowing [place] to participate in this patient's care.  Adele Barthel, MD [facility] Office: [phone] Pager: [phone]  09/08/2013, 3:16 PM

## 2013-09-12 ENCOUNTER — Ambulatory Visit (INDEPENDENT_AMBULATORY_CARE_PROVIDER_SITE_OTHER): Payer: BC Managed Care – PPO | Admitting: Urology

## 2013-09-12 DIAGNOSIS — C61 Malignant neoplasm of prostate: Secondary | ICD-10-CM

## 2013-09-19 ENCOUNTER — Encounter: Payer: Self-pay | Admitting: Urology

## 2013-09-26 ENCOUNTER — Encounter: Payer: Self-pay | Admitting: Radiation Oncology

## 2013-10-02 ENCOUNTER — Telehealth: Payer: Self-pay | Admitting: *Deleted

## 2013-10-02 NOTE — Telephone Encounter (Signed)
Called patient to introduce myself as Prostate Oncology Navigator and coordinator of the Prostate [place], spoke with his wife.  I confirmed his referral for the clinic on 10/10/13, arrival time of 12:15, location of [place], registration procedure, and format of clinic.  She verbalized understanding. I indicated I would be mailing an [place] that includes medical information forms which should be completed and brought on the the day of clinic.  To this end, I confirmed his mailing address.  I provided my phone number and encouraged them to call me if there are any questions after receiving the Information Packet or prior to my call the day before clinic.  She verbalized understanding and expressed appreciation for my call.    Gayleen Orem, RN, BSN, [place] at [place] [phone]

## 2013-10-05 ENCOUNTER — Telehealth: Payer: Self-pay | Admitting: Oncology

## 2013-10-05 ENCOUNTER — Encounter: Payer: Self-pay | Admitting: Radiation Oncology

## 2013-10-05 NOTE — Progress Notes (Signed)
GU Location of Tumor / Histology: prostate adenocarcinoma  If Prostate Cancer, Gleason Score is (3 + 4) and PSA is (10.20 on 07/18/13)  Bob Maldonado presented in 2013 with signs/symptoms of:   Biopsies of  prostate (if applicable) revealed:  [id number]  Volume 55 mL   Biopsy 2013 Gleason 7, 7/12 cores positive  Past/Anticipated interventions by urology, if any: biopsies x 2  Past/Anticipated interventions by medical oncology, if any: none  Weight changes, if any: no  Bowel/Bladder complaints, if any:  IPSS 6, weak stream almost always  Nausea/Vomiting, if any: no  Pain issues, if any:  no  SAFETY ISSUES:  Prior radiation? no  Pacemaker/ICD? no  Possible current pregnancy? na  Is the patient on methotrexate? no  Current Complaints / other details:  Married, Barrister's clerk, 1 daughter No family hx prostate cancer known.

## 2013-10-05 NOTE — Telephone Encounter (Signed)
Chart is ready for the prostate clinic on 8/25.

## 2013-10-09 ENCOUNTER — Telehealth: Payer: Self-pay | Admitting: *Deleted

## 2013-10-09 NOTE — Telephone Encounter (Signed)
Called patient to see if he had any questions prior to his attendance at tomorrow's Prostate [facility].  LVM x 2 with reminder to arrive at [facility] at 12:15 and to bring completed medical forms.  Gayleen Orem, RN, BSN, [place] at [place] [phone]

## 2013-10-10 ENCOUNTER — Ambulatory Visit
Admission: RE | Admit: 2013-10-10 | Discharge: 2013-10-10 | Disposition: A | Discharge status: Home / Self Care | Payer: BC Managed Care – PPO | Source: Ambulatory Visit | Attending: Radiation Oncology | Admitting: Radiation Oncology

## 2013-10-10 ENCOUNTER — Encounter: Payer: Self-pay | Admitting: *Deleted

## 2013-10-10 ENCOUNTER — Encounter: Payer: Self-pay | Admitting: Radiation Oncology

## 2013-10-10 ENCOUNTER — Encounter: Payer: Self-pay | Admitting: Specialist

## 2013-10-10 ENCOUNTER — Ambulatory Visit (HOSPITAL_BASED_OUTPATIENT_CLINIC_OR_DEPARTMENT_OTHER): Payer: BC Managed Care – PPO | Admitting: Oncology

## 2013-10-10 ENCOUNTER — Encounter: Payer: Self-pay | Admitting: Oncology

## 2013-10-10 VITALS — BP 122/68 | HR 62 | Temp 98.2°F | Resp 20 | Ht 75.0 in | Wt 231.0 lb

## 2013-10-10 DIAGNOSIS — N186 End stage renal disease: Secondary | ICD-10-CM

## 2013-10-10 DIAGNOSIS — I10 Essential (primary) hypertension: Secondary | ICD-10-CM

## 2013-10-10 DIAGNOSIS — C61 Malignant neoplasm of prostate: Secondary | ICD-10-CM

## 2013-10-10 DIAGNOSIS — E119 Type 2 diabetes mellitus without complications: Secondary | ICD-10-CM

## 2013-10-10 DIAGNOSIS — Z5320 Procedure and treatment not carried out because of patient's decision for unspecified reasons: Secondary | ICD-10-CM

## 2013-10-10 HISTORY — DX: Unspecified osteoarthritis, unspecified site: M19.90

## 2013-10-10 HISTORY — DX: Malignant neoplasm of prostate: C61

## 2013-10-10 NOTE — Progress Notes (Signed)
I met with Bob Maldonado and his wife in the prostate clinic today.  I provided them with information on available services in the Patient and [facility], as well as my contact information. He did not identify any needs.   Epifania Gore, PhD, [place]

## 2013-10-10 NOTE — Progress Notes (Signed)
[facility] NEW PATIENT EVALUATION  Name: Bob Maldonado MRN: [id number]  Date:   10/10/2013           DOB: 1955/10/08  Status: outpatient   CC: Vic Blackbird, MD  Alycia Rossetti, MD    REFERRING PHYSICIAN: Dr. 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 male who is seen today at the prostate multidisciplinary clinic for evaluation of his stageT2b intermediate to high-risk adenocarcinoma prostate. I understand that he was initially diagnosed with prostate cancer in 2013 by Dr. Michela Pitcher. At that time he presented with a PSA of 7.43 with 7 of 12 biopsies containing carcinoma. All 6 were Gleason 6 (3+3) except for Gleason 7 (3+4) along the left apex. He was lost to followup after seeing Dr. Brendia Sacks.  He recently presented to Dr. Diona Fanti with a PSA just under 18. A followup PSA was 10.2 by 07/18/2013. He underwent ultrasound-guided biopsies on 08/29/2013. He was found to have Gleason 7 (3+4) involving 70% of one core from the right apex, 60% of one core from left lateral base, 30% of one core from the left base, 60% of one core from left lateral mid gland and 2 cores from the left apex. He also had Gleason 6 (3+3) involving 80% of one core from the right mid gland and 80% of one core from the left mid gland. His gland volume was approximately 55 cc. He is been on dialysis since April of this year for end-stage renal disease. Dr. Alinda Money spoke with one of his nephrologist and he feels that he has a reasonable life expectancy, and could potentially be a candidate for a kidney transplant. He is oliguric and voids only approximately "1/2 cup twice a day". He is on fluid restriction. It is unclear as to whether not he can fill his bladder with additional fluid intake (I will need to check with his nephrologist).  His staging workup included negative bone scan  in July 2015 and also a negative CT scan of the pelvis. No GI difficulties. He does have erectile dysfunction. I believe that Dr. Florene Glen is his primary nephrologist.  PREVIOUS RADIATION THERAPY: No   PAST MEDICAL HISTORY:  has a past medical history of Diabetes mellitus without complication; Hypertension; Hyperlipidemia; Gout; Cancer; Chronic kidney disease; Renal insufficiency; Prostate cancer (03/16/11, 08/29/13); Dialysis patient; and Arthritis.     PAST SURGICAL HISTORY:  Past Surgical History  Procedure Laterality Date  . Right knee  1985  . Insertion of dialysis catheter Right 05/30/2013    Procedure: INSERTION OF DIALYSIS CATHETER;  Surgeon: Conrad Owosso, MD;  Location: [place];  Service: Vascular;  Laterality: Right;  . Av fistula placement Left 05/30/2013    Procedure: Brachial vein transposition 1st stage;  Surgeon: Conrad Lime Ridge, MD;  Location: [place];  Service: Vascular;  Laterality: Left;  . Colonoscopy    . Bascilic vein transposition Left 07/25/2013    Procedure: SECOND STAGE BRACHIAL VEIN TRANSPOSITION;  Surgeon: Conrad [place], MD;  Location: [place];  Service: Vascular;  Laterality: Left;  . Prostate biopsy  02/2011    Gleason 7  . Prostate biopsy  08/29/13    Gleason 7, volume 55 mL     FAMILY HISTORY: family history includes Cancer in his father; Diabetes in his mother; Heart attack in his mother; Heart disease in his brother and mother.  His  father died at age 58, unknown cause. His mother died from complications of diabetes mellitus and cardiac disease at 70.   SOCIAL HISTORY:  reports that he quit smoking about 23 years ago. His smoking use included Cigarettes. He has a 20 pack-year smoking history. He has never used smokeless tobacco. He reports that he does not drink alcohol or use illicit drugs.  Married, 2 children. He worked as an Best boy, currently on disability secondary to kidney failure.   ALLERGIES: Review of patient's allergies indicates no known  allergies.   MEDICATIONS:  Current Outpatient Prescriptions  Medication Sig Dispense Refill  . B-D UF III MINI PEN NEEDLES 31G X 5 MM MISC       . Blood Glucose Monitoring Suppl (ONE TOUCH ULTRA MINI) W/DEVICE KIT       . cholecalciferol (VITAMIN D) 1000 UNITS tablet Take 1,000 Units by mouth daily.      . cloNIDine (CATAPRES) 0.1 MG tablet Take 1 tablet (0.1 mg total) by mouth 2 (two) times daily.  180 tablet  2  . colchicine 0.6 MG tablet Take 1 tablet (0.6 mg total) by mouth daily.  90 tablet  2  . fluticasone (FLONASE) 50 MCG/ACT nasal spray Place 1 spray into both nostrils as needed for allergies or rhinitis.      . hydrALAZINE (APRESOLINE) 100 MG tablet Take 1 tablet (100 mg total) by mouth 2 (two) times daily.  180 tablet  2  . Insulin Glargine (LANTUS) 100 UNIT/ML Solostar Pen Inject 10 Units into the skin at bedtime.  15 mL  3  . metoprolol succinate (TOPROL XL) 100 MG 24 hr tablet Take 1 tablet (100 mg total) by mouth daily. Take with or immediately following a meal.  90 tablet  2  . multivitamin (RENA-VIT) TABS tablet Take 1 tablet by mouth daily.      . ONE TOUCH ULTRA TEST test strip       . ONETOUCH DELICA LANCETS 86H MISC       . pravastatin (PRAVACHOL) 40 MG tablet Take 1 tablet (40 mg total) by mouth daily.  90 tablet  2  . Tetrahydrozoline HCl (VISINE OP) Place 2 drops into both eyes 2 (two) times daily as needed (dry eyes).      . calcium acetate (PHOSLO) 667 MG capsule Take 2 capsules (1,334 mg total) by mouth 3 (three) times daily with meals.  540 capsule  2  . cetirizine (ZYRTEC) 10 MG tablet Take 10 mg by mouth daily.      Marland Kitchen levofloxacin (LEVAQUIN) 500 MG tablet       . NIFEdipine (ADALAT CC) 90 MG 24 hr tablet       . oxyCODONE (OXY IR/ROXICODONE) 5 MG immediate release tablet       . oxyCODONE-acetaminophen (PERCOCET/ROXICET) 5-325 MG per tablet        No current facility-administered medications for this encounter.     REVIEW OF SYSTEMS:  Pertinent items are  noted in HPI.    PHYSICAL EXAM:  height is '6\' 3"'  (1.905 m) and weight is 231 lb (104.781 kg). His oral temperature is 98.2 F (36.8 C). His blood pressure is 122/68 and his pulse is 62. His respiration is 20.   Alert and oriented 58 year old African American male appearing his stated age. Rectal examination: Gland is slightly enlarged and asymmetric. The left lateral lobe is slightly raised and indurated. No definite periprostatic tumor extension.   LABORATORY DATA:  Lab Results  Component Value Date  WBC 4.9 08/31/2013   HGB 13.5 08/31/2013   HCT 40.0 08/31/2013   MCV 81.1 08/31/2013   PLT 216 08/31/2013   Lab Results  Component Value Date   NA 138 08/31/2013   K 5.0 08/31/2013   CL 98 08/31/2013   CO2 27 08/31/2013   Lab Results  Component Value Date   ALT 14 08/31/2013   AST 17 08/31/2013   ALKPHOS 68 08/31/2013   BILITOT 0.6 08/31/2013      IMPRESSION: Clinical stageT2b intermediate to high-risk adenocarcinoma prostate. I explained to the patient and his wife that his prognosis is related to his stage, PSA level, and Gleason score. Based on his PSA and Gleason score, he has intermediate to high-risk disease. Management options include surgery versus radiation therapy. My understanding that he is more likely to be accepted for a kidney transplant if his prostate cancer can be placed in remission. We discussed 5 weeks of external beam followed by seed implantation or 8 weeks of external beam/IMRT. I would be concerned about 8 weeks of external beam/IMRT if he is unable to have a comfortably full bladder to minimize radiation to his bladder. I can check with his nephrologist to see if there is a way to improve his bladder filling during his course of radiation therapy. Assuming that this is not the case, my preferred radiation therapy approach would be 5 weeks of external beam followed by seed implantation. Seed implantation would limit the dose of radiation therapy to his bladder and this  would be preferable if he is to go on to have a kidney transplant with a functional bladder. We discussed the potential acute and late toxicities of radiation therapy. I explained to the patient that he is also a candidate for surgery which would give [place] a pathological stage and perhaps place him in remission earlier compared to radiation therapy. He is interested in radiation therapy at this time. I feel that he should receive at least 6 months of androgen deprivation therapy beginning in the  near future through Dr. Diona Fanti.  He will undergo 5 weeks of external beam (3-D conformal) radiation therapy in approximately 3 months. I will see the patient back for a followup visit in 2 months. He can undergo a CT arch study at the time of his CT simulation in 3 months. Androgen deprivation therapy would also reduce his prostate volume by one third and this would decrease the both bladder and rectal doses and thus minimize toxicity. We'll also need to have Dr. Diona Fanti to placed 3 gold seed markers within the next 2 months.   PLAN: As discussed above.  I spent 45 minutes face to face with the patient and more than 50% of that time was spent in counseling and/or coordination of care.

## 2013-10-10 NOTE — Progress Notes (Signed)
Please see consult note.  

## 2013-10-10 NOTE — Consult Note (Signed)
Reason for Referral: Prostate cancer.   HPI: 58 year old gentleman native of [place] but currently lives in [place]. He is a pleasant gentleman with long-standing history of hypertension and diabetes that led to end-stage renal disease and currently on hemodialysis. He presented with a elevated PSA of 7.9 04/26/2011. He subsequently had a repeat PSA in June of 2015 was up to 10.2. He underwent a biopsy in 08/29/2013 by Dr. Luberta Robertson his prostate volume was 55 cc. Prostate biopsy showed a Gleason score of 3+4 equals 7 predominantly in the left lobe of the prostate. He had 2 cores in the right lobe, one showed 70% prostate cancer 3+4 equals 7 and the other was 3+3 equals 6. Patient was referred to the prostate cancer multidisciplinary clinic for a discussion regarding treatment options. Clinically patient is relatively asymptomatic. He does not report any headaches or blurry vision or syncope. He is not report any chest pain or shortness of breath. It is not report any cough or hemoptysis or hematemesis. He is not report any nausea or vomiting. He does not report any fevers or chills or sweats or weight loss. He does report some occasional frequency and nocturia. But no hematuria or dysuria. Continue to be very active and has excellent quality of life. He did not report any skeletal complaints. Rest of his review of systems is unremarkable.   Past Medical History  Diagnosis Date  . Diabetes mellitus without complication   . Hypertension   . Hyperlipidemia   . Gout   . Cancer     prostate / Declines treatment  . Chronic kidney disease     ESRD- Declines Dialysis  . Renal insufficiency   . Prostate cancer 03/16/11, 08/29/13    Gleason 7, volume 55 mL  . Dialysis patient   . Arthritis   :  Past Surgical History  Procedure Laterality Date  . Right knee  1985  . Insertion of dialysis catheter Right 05/30/2013    Procedure: INSERTION OF DIALYSIS CATHETER;  Surgeon: Conrad Raymond,  MD;  Location: [place];  Service: Vascular;  Laterality: Right;  . Av fistula placement Left 05/30/2013    Procedure: Brachial vein transposition 1st stage;  Surgeon: Conrad Fulda, MD;  Location: [place];  Service: Vascular;  Laterality: Left;  . Colonoscopy    . Bascilic vein transposition Left 07/25/2013    Procedure: SECOND STAGE BRACHIAL VEIN TRANSPOSITION;  Surgeon: Conrad Tivoli, MD;  Location: [place];  Service: Vascular;  Laterality: Left;  . Prostate biopsy  02/2011    Gleason 7  . Prostate biopsy  08/29/13    Gleason 7, volume 55 mL  :    Current Outpatient Prescriptions  Medication Sig Dispense Refill  . B-D UF III MINI PEN NEEDLES 31G X 5 MM MISC       . Blood Glucose Monitoring Suppl (ONE TOUCH ULTRA MINI) W/DEVICE KIT       . calcium acetate (PHOSLO) 667 MG capsule Take 2 capsules (1,334 mg total) by mouth 3 (three) times daily with meals.  540 capsule  2  . cetirizine (ZYRTEC) 10 MG tablet Take 10 mg by mouth daily.      . cholecalciferol (VITAMIN D) 1000 UNITS tablet Take 1,000 Units by mouth daily.      . cloNIDine (CATAPRES) 0.1 MG tablet Take 1 tablet (0.1 mg total) by mouth 2 (two) times daily.  180 tablet  2  . colchicine 0.6 MG tablet Take 1 tablet (0.6 mg  total) by mouth daily.  90 tablet  2  . fluticasone (FLONASE) 50 MCG/ACT nasal spray Place 1 spray into both nostrils as needed for allergies or rhinitis.      . hydrALAZINE (APRESOLINE) 100 MG tablet Take 1 tablet (100 mg total) by mouth 2 (two) times daily.  180 tablet  2  . Insulin Glargine (LANTUS) 100 UNIT/ML Solostar Pen Inject 10 Units into the skin at bedtime.  15 mL  3  . levofloxacin (LEVAQUIN) 500 MG tablet       . metoprolol succinate (TOPROL XL) 100 MG 24 hr tablet Take 1 tablet (100 mg total) by mouth daily. Take with or immediately following a meal.  90 tablet  2  . multivitamin (RENA-VIT) TABS tablet Take 1 tablet by mouth daily.      Marland Kitchen NIFEdipine (ADALAT CC) 90 MG 24 hr tablet       . ONE TOUCH ULTRA TEST  test strip       . ONETOUCH DELICA LANCETS 25D MISC       . oxyCODONE (OXY IR/ROXICODONE) 5 MG immediate release tablet       . oxyCODONE-acetaminophen (PERCOCET/ROXICET) 5-325 MG per tablet       . pravastatin (PRAVACHOL) 40 MG tablet Take 1 tablet (40 mg total) by mouth daily.  90 tablet  2  . Tetrahydrozoline HCl (VISINE OP) Place 2 drops into both eyes 2 (two) times daily as needed (dry eyes).       No current facility-administered medications for this visit.    :  No Known Allergies:  Family History  Problem Relation Age of Onset  . Diabetes Mother   . Heart disease Mother   . Heart attack Mother   . Cancer Father   . Heart disease Brother   :  History   Social History  . Marital Status: Married    Spouse Name: N/A    Number of Children: N/A  . Years of Education: N/A   Occupational History  . Not on file.   Social History Main Topics  . Smoking status: Former Smoker -- 1.00 packs/day for 20 years    Types: Cigarettes    Quit date: 09/27/1990  . Smokeless tobacco: Never Used     Comment: quit 25 years prior  . Alcohol Use: No     Comment: rare  . Drug Use: No  . Sexual Activity: Yes   Other Topics Concern  . Not on file   Social History Narrative  . No narrative on file  :  Pertinent items are noted in HPI.  Exam: ECOG 0 There were no vitals taken for this visit. General appearance: alert and cooperative Head: Normocephalic, without obvious abnormality Throat: lips, mucosa, and tongue normal; teeth and gums normal Neck: no adenopathy Back: negative Resp: clear to auscultation bilaterally Chest wall: no tenderness Cardio: regular rate and rhythm, S1, S2 normal, no murmur, click, rub or gallop GI: soft, non-tender; bowel sounds normal; no masses,  no organomegaly Extremities: extremities normal, atraumatic, no cyanosis or edema Pulses: 2+ and symmetric Skin: Skin color, texture, turgor normal. No rashes or lesions Lymph nodes: Cervical,  supraclavicular, and axillary nodes normal.    Assessment and Plan:   58 year old gentleman with prostate cancer diagnosed in July of 2015. His PSA was 10.2 with a Gleason score 3+4 equals 7. Prostate biopsy confirmed the presence of a high-volume disease especially in the left lobe of the prostate. His case was discussed today in the prostate cancer  multidisciplinary clinic. Imaging studies including CT scan of the abdomen and pelvis as well as bone scans obtained in July of 2015 reviewed with radiology. This pathology was reviewed also by the reviewing pathologists today.  Options of treatment were discussed with the patient today. Given his high-risk disease will probably require either surgical intervention versus androgen depravation with hormone therapy. Risks and benefits of those approaches were discussed today as a part of his multidisciplinary assessment. The major factor in determining which curative modality to choose from his yearly depending on his prognosis from end-stage renal disease. It appears to be that he is in good health and shape and given his age he would be a candidate for renal transplant of his prostate cancer is potentially cured. Given these findings, both approaches are reasonable. The patient's preference is to avoid surgery and proceed with radiation instead. I discussed with him the role of androgen deprivation at this point which would be a central to be added to radiation therapy. Complications from that that includes hot flashes, weight gain, hyperlipidemia, hyperglycemia among other complications. He is willing to proceed but certainly he will listen to Dr. Lynne Logan assessment of his surgical candidacy and he'll make his final decision.  All his questions were answered today to his satisfaction.

## 2013-10-10 NOTE — Consult Note (Signed)
hief Complaint  Prostate Cancer   Reason For Visit  To discuss treatment options for prostate cancer. Location of consult: [facility] PCP: Dr. Idamae Lusher   History of Present Illness  Mr. Ebel is a 58 year old gentleman referred to the [facility] and seen at the request of Dr. Cranston Neighbor.  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 Erling Cruz.  He has apparently been informed by Dr. Florene Glen that he would be a renal transplant candidate if he was rendered disease-free from his prostate cancer.  He was initially diagnosed with prostate cancer by Dr. Michela Pitcher in [place] in 2013 when his PSA was 7.93 and he was noted to have Gleason 3+4=7 adenocarcinoma with 7 out of 12 biopsy cores positive for malignancy. He was seen by Dr. Brendia Sacks at [facility] at that time. He was then lost to follow up.  He again presented to Dr. Diona Fanti in 2015 after his PSA was noted to have increased to 17.7.  This was rechecked and had decreased to 10.2 in June 2015. A repeat biopsy was performed by Dr. Diona Fanti on 08/29/13 demonstrating Gleason 3+4=7 adenocarcinoma with 8 out of 12 biopsy cores positive with perineural invasion noted in multiple cores. He did undergo staging studies on 08/22/13 by Dr. Diona Fanti and a bone scan demonstrated non-specific uptake at the proximal femurs, bilateral humeri, ischii, and ribs.  The pattern was felt to be degenerative although metastases could not be ruled out. A CT scan of the pelvis from the same day demonstrated only a non-pathologically enlarged 8 mm R common iliac lymph node. No pelvic bone lesions were identified lending further evidence that the uptake on bone scan was most likely to be degenerative change.  ** He has ESRD and receives hemodialysis on M-W-F.   TNM stage: cT1c N0 M0 PSA: 10.2 Gleason score: 3+4=7 Biopsy  (08/29/13): 8/12 cores positive    L apex (50%, 60%, 3+4=7), L lateral mid (60%, 3+4=7, PNI), L mid (80%, 3+3=6, PNI), L lateral base (60%, 3+4=7, PNI), L base (30%, 3+4=7, PNI)    R apex (70%, 3+4=7), R mid (80%, 3+3=6) Prostate volume: 55.3 cc  Nomogram OC disease: 37% EPE: 60% SVI: 10% LNI: 8% PFS (surgery): 71% at 5 years, 57 % at 10 years  Urinary function: He makes very little urine each day and typically voids once daily.  IPSS is 6. Erectile function: He has severe pre-existing erectile dysfunction.   Past Medical History  1. History of Dialysis patient (V45.11)  2. History of diabetes mellitus (V12.29)  3. History of gout (V12.29)  4. History of hypercholesterolemia (V12.29)  5. History of hypertension (V12.59)  6. History of prostate cancer (V10.46)  7. History of renal failure (V13.09)  Surgical History  1. History of Arthroscopy Knee Right  Current Meds  1. Calcium Acetate CAPS;  Therapy: (Recorded:02Jun2015) to Recorded  2. CloNIDine HCl - 0.1 MG Oral Tablet;  Therapy: (Recorded:02Jun2015) to Recorded  3. HydrALAZINE HCl - 100 MG Oral Tablet;  Therapy: ([id number]) to Recorded  4. Lantus SOLN;  Therapy: ([id number]) to Recorded  5. Levofloxacin 500 MG Oral Tablet; take one tab by mouth the day before procedure, take  one tab by mouth the day of procedure, take one tab by mouth the after the procedure;  Therapy: [id number] to (Last Rx:02Jun2015)  Requested for: 02Jun2015 Ordered  6. Metoprolol Succinate ER 100 MG Oral  Tablet Extended Release 24 Hour;  Therapy: ([id number]) to Recorded  7. Pravastatin Sodium 40 MG Oral Tablet;  Therapy: ([id number]) to Recorded  8. ZyrTEC Allergy TABS;  Therapy: (Recorded:02Jun2015) to Recorded  Allergies  1. No Known Drug Allergies  Family History  1. Family history of Death : Mother, Father  2. Family history of myocardial infarction (V17.3) : Mother  Social History   Denied: History of  Alcohol use   Married   Occupation   Tobacco use (305.1)  Review of Systems AU Complete-Male: Genitourinary, constitutional, skin, eye, otolaryngeal, hematologic/lymphatic, cardiovascular, pulmonary, endocrine, musculoskeletal, gastrointestinal, neurological and psychiatric system(s) were reviewed and pertinent findings if present are noted.  Constitutional: no recent weight loss.  Cardiovascular: no leg swelling.  Respiratory: no shortness of breath.    Physical Exam Constitutional: Well nourished and well developed . No acute distress.  ENT:. The ears and nose are normal in appearance.  Neck: The appearance of the neck is normal and no neck mass is present.  Pulmonary: No respiratory distress and normal respiratory rhythm and effort.  Abdomen: No CVA tenderness. No hernias are palpable.  Rectal: He does have left-sided induration consistent with his diagnosis. I cannot palpate any clear extraprostatic disease however. This would be consistent with clinical stage T2b disease.  Skin: Normal skin turgor, no visible rash and no visible skin lesions.  Neuro/Psych:. Mood and affect are appropriate.    Results/Data  I have independently reviewed his medical records, PSA results, imaging studies, and pathology slides in a multidisciplinary conference today.  Findings are as dictated above.     Assessment  1. Adenocarcinoma of prostate (185)  Discussion/Summary   1.  Prostate cancer: I had a detailed discussion with Mr. Nutile today.  He apparently has been deemed to be an appropriate patient and consider a transplant evaluation if he is free of his malignancy.  It would appear that his 10 year life expectancy would be reasonable therefore making treatment of his prostate cancer appropriate.  I did speak with Dr. Marval Regal today and he gave me more information regarding his end-stage renal disease although he could not speak for Dr. Florene Glen who is the patient's primary nephrologist.  The  patient is interested in proceeding with treatment and would like to become a potential renal transplant candidate.  I had a detailed discussion with him today regarding options for curative therapy including primary surgical therapy as well as radiotherapy with short-term androgen deprivation therapy.  He has seen Dr. Valere Dross earlier today and Dr. Valere Dross offered him the option of 5 weeks of IMRT followed by a radiation seed implantation after androgen deprivation therapy to decrease his prostate volume.  Considering his small bladder which does not fill to large volumes, he does have concerns about 8 weeks of external beam radiation therapy and the potential bladder toxicity with that approach.  This is why he would favor the seed implant in conjunction with 5 weeks of external beam therapy.  If his CT arch study did not deem him to be an appropriate seed implant candidate, then he may need to reconsider his options at that point.  I discussed primary surgical therapy is alternative today.  He admittedly does not appear to be very interested in surgical treatment of his prostate cancer although we discussed the potential advantages of pathologic staging and the more definitive surveillance follow-up associated with an undetectable PSA.  We discussed both treatment options and he understands that they have an equivalent chance of providing care.  He understands that both treatment options do have a risk of urinary toxicity including incontinence and worsening lower urinary tract symptoms.  He already has severe pre-existing erectile dysfunction and this is not a concern to him.  We also discussed the potential risk of bowel toxicity with radiation therapy.  Finally, we discussed the side effects associated with androgen deprivation therapy.  He and his wife asked multiple questions.  His questions were answered to their stated satisfaction.  Assuming that Dr. Florene Glen agrees that his 71 year life expectancy is  relatively good and that he could be considered for a renal transplant, he would appear to be an appropriate candidate for curative therapy.  It is his preference to proceed with short-term androgen deprivation therapy and radiation therapy including 5 weeks of IMRT followed by radiation seed implant.  I will make Dr. Diona Fanti aware of the patient's decision so that he can begin therapy in the near future.  Cc: Dr. Franchot Gallo Dr. Idamae Lusher Dr. Arloa Koh Dr. Zola Button Dr. Erling Cruz   A total of 35 minutes were spent in the overall care of the patient today with 35 minutes in direct face to face consultation.    Signatures Electronically signed by : Raynelle Bring, M.D.; Oct 10 2013  4:34PM EST

## 2013-10-11 ENCOUNTER — Telehealth: Payer: Self-pay | Admitting: *Deleted

## 2013-10-11 NOTE — Telephone Encounter (Signed)
CALLED PATIENT TO INFORM OF GOLD SEED PLACEMENT ON 11-29-13- ARRIVAL TIME - 3:15 PM @ DR. DAHLSTEDT'S OFFICE AND HIS Register VISIT WITH DR. Valere Dross ON 12-13-13- ARRIVAL TIME - 3 PM, SPOKE WITH PATIENT'S WIFE AND SHE IS AWARE OF THESE APPTS.

## 2013-10-11 NOTE — Progress Notes (Signed)
Met with patient as part of Prostate MDC.  Reintroduced my role as his navigator and encouraged him to call as he proceeds with treatments and appointments at [facility].  Provided the accompanying Care Plan Summary at the end of his visit:                                          Care Plan Summary  Name: Bob Maldonado DOB: 31-Oct-1955  Your Medical Team:   Urologist -  Dr. Raynelle Bring, [facility]  Radiation Oncologist - Dr. Arloa Koh, [facility]   Medical Oncologist - Dr. Zola Button, [facility] Recommendations: 1) Hormone therapy for approximately 3 months. 2) Radiation treatment. * These recommendations are based on information available as of today's consult.      Recommendations may change depending on the results of further tests or exams. Next Steps: 1) See Dr. Vernie Shanks for hormone therapy.  2) Dr. Vernie Shanks will refer you to Dr. Valere Dross for radiation treatment. When appointments need to be scheduled, you will be contacted by [facility] and/or [facility].  Questions?  Please do not hesitate to call Bob Orem, RN, BSN, [facility] at [phone] with any questions or concerns.  Bob Maldonado is Counsellor and is available to assist you while you're receiving your medical care at [facility]. ______________________________________________________________________________________________________________________  I encouraged him to call me with any questions or concerns as his treatments progress.  He indicated understanding.  Bob Orem, RN, BSN, [facility] [phone]

## 2013-10-17 ENCOUNTER — Ambulatory Visit: Payer: BC Managed Care – PPO | Admitting: Urology

## 2013-11-30 ENCOUNTER — Telehealth: Payer: Self-pay | Admitting: *Deleted

## 2013-11-30 NOTE — Telephone Encounter (Signed)
Called patient to check on seed placement, lvm for a return call

## 2013-12-12 NOTE — Progress Notes (Signed)
GU Location of Tumor / Histology: prostate adenocarcinoma   If Prostate Cancer, Gleason Score is (3 + 4) and PSA is (10.20 on 07/18/13)   Bob Maldonado presented in 2013 with signs/symptoms of: elevated PSA  Biopsies of prostate (if applicable) revealed:  [id number] Volume 55 mL   Biopsy 2013 Gleason 7, 7/12 cores positive   Past/Anticipated interventions by urology, if any: biopsies x 2  Past/Anticipated interventions by medical oncology, if any: none  Weight changes, if any: no   Bowel/Bladder complaints, if any:  No changes, IPSS 6, weak stream  Nausea/Vomiting, if any: no   Pain issues, if any: no   SAFETY ISSUES:  Prior radiation? no  Pacemaker/ICD? no  Possible current pregnancy? na  Is the patient on methotrexate? 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 Patient prefers androgen deprivation therapy and radiation therapy including 5 weeks of IMRT followed by seed implant, per Dr Lynne Logan note 10/10/13  12/07/13  Three gold seed markers placed, Dr Diona Fanti, received androgen injection on 12/06/13

## 2013-12-13 ENCOUNTER — Encounter: Payer: Self-pay | Admitting: Radiation Oncology

## 2013-12-13 ENCOUNTER — Ambulatory Visit
Admission: RE | Admit: 2013-12-13 | Discharge: 2013-12-13 | Disposition: A | Discharge status: Home / Self Care | Payer: BC Managed Care – PPO | Source: Ambulatory Visit | Attending: Radiation Oncology | Admitting: Radiation Oncology

## 2013-12-13 VITALS — BP 106/60 | HR 62 | Temp 98.0°F | Resp 20 | Ht 75.0 in | Wt 223.0 lb

## 2013-12-13 DIAGNOSIS — N529 Male erectile dysfunction, unspecified: Secondary | ICD-10-CM | POA: Insufficient documentation

## 2013-12-13 DIAGNOSIS — N186 End stage renal disease: Secondary | ICD-10-CM | POA: Diagnosis not present

## 2013-12-13 DIAGNOSIS — Z7189 Other specified counseling: Secondary | ICD-10-CM | POA: Diagnosis not present

## 2013-12-13 DIAGNOSIS — Z992 Dependence on renal dialysis: Secondary | ICD-10-CM | POA: Insufficient documentation

## 2013-12-13 DIAGNOSIS — C61 Malignant neoplasm of prostate: Secondary | ICD-10-CM

## 2013-12-13 NOTE — Progress Notes (Signed)
CC: Dr. Franchot Gallo  Follow-up note:  Bob Maldonado visits today for review and scheduling of his radiation therapy in the management of his clinical stageT2b intermediate to high risk adenocarcinoma the prostate. I first saw the patient at the prostate multidisciplinary clinic back on 10/10/2013. I understand that he was initially diagnosed with prostate cancer in 2013 by Dr. Michela Maldonado. At that time he presented with a PSA of 7.43 with 7 of 12 biopsies containing carcinoma. All 6 were Gleason 6 (3+3) except for Gleason 7 (3+4) along the left apex. He was lost to followup after seeing Dr. Brendia Maldonado. He recently presented to Dr. Diona Maldonado with a PSA just under 18. A followup PSA was 10.2 by 07/18/2013. He underwent ultrasound-guided biopsies on 08/29/2013. He was found to have Gleason 7 (3+4) involving 70% of one core from the right apex, 60% of one core from left lateral base, 30% of one core from the left base, 60% of one core from left lateral mid gland and 2 cores from the left apex. He also had Gleason 6 (3+3) involving 80% of one core from the right mid gland and 80% of one core from the left mid gland. His gland volume was approximately 55 cc. He is been on dialysis since April of this year for end-stage renal disease. Dr. Alinda Maldonado spoke with one of his nephrologist and he feels that he has a reasonable life expectancy, and could potentially be a candidate for a kidney transplant. He is oliguric and voids only approximately "1/2 cup twice a day". He is on fluid restriction. It is unclear as to whether not he can fill his bladder with additional fluid intake (I will need to check with his nephrologist). His staging workup included negative bone scan in July 2015 and also a negative CT scan of the pelvis. No GI difficulties. He does have erectile dysfunction. I believe that Dr. Florene Maldonado is his primary nephrologist. He received a Depo-Lupron injection on 11/29/2013 with Dr. Diona Maldonado and he'll see Dr. Diona Maldonado for  a follow-up visit in 2 months. He had placement of gold seed markers for image guidance on 12/07/2013. He is not having any significant hot flashes or fatigue. He notices that his sex drive is diminished. No difficulty with told his blood sugars.  Physical examination: Alert and oriented. Filed Vitals:   12/13/13 1514  BP: 106/60  Pulse: 62  Temp: 98 F (36.7 C)  Resp: 20   Rectal examination the prostate gland is normal in size and the previously noted induration along the left lateral lobe is less distinct.  Impression: Clinical stageT2b intermediate to high risk adenocarcinoma prostate. Our plan is to give him 5 weeks of external beam (3-D conformal) followed by a seed implant boost 2 weeks following completion of external beam. We discussed potential acute and late toxicities of radiation therapy. We talked about our goal of him having a comfortable full bladder and since he is oliguric this may be a problem. He will see if he can avoid urinating within 6 hours of simulation, and for his daily treatment. He does have dialysis at 7am in the morning 3 days a week, therefore we will try to get him scheduled for his treatment later in the afternoon. Consent is signed today. We will do his CT arch study when he returns for 3 simulation the week of December 14. We'll probably start his treatment in late December.  Plan: As above.  30 minutes was spent face-to-face with the patient, primarily counseling patient  and coordinating his care.

## 2013-12-13 NOTE — Progress Notes (Signed)
Please see the Nurse Progress Note in the MD Initial Consult Encounter for this patient. 

## 2013-12-25 ENCOUNTER — Encounter: Payer: Self-pay | Admitting: Orthopedic Surgery

## 2013-12-25 ENCOUNTER — Other Ambulatory Visit: Payer: Self-pay | Admitting: *Deleted

## 2013-12-25 ENCOUNTER — Ambulatory Visit (INDEPENDENT_AMBULATORY_CARE_PROVIDER_SITE_OTHER): Payer: BC Managed Care – PPO | Admitting: Orthopedic Surgery

## 2013-12-25 VITALS — BP 127/50 | Ht 75.0 in | Wt 223.0 lb

## 2013-12-25 DIAGNOSIS — M75102 Unspecified rotator cuff tear or rupture of left shoulder, not specified as traumatic: Principal | ICD-10-CM

## 2013-12-25 DIAGNOSIS — M75101 Unspecified rotator cuff tear or rupture of right shoulder, not specified as traumatic: Secondary | ICD-10-CM

## 2013-12-25 NOTE — Progress Notes (Signed)
Patient ID: Bob Maldonado, male   DOB: 1955-06-23, 59 y.o.   MRN: [id number] Subjective:     Bob Maldonado is a 58 y.o. male who presents with pain in both shoulders since April. He relates his symptoms to a 80 pound weight loss and kidney failure at which time is in the hospital for several days and then had difficulty sleeping and had to sleep a certain way. Since that time he has noticed stabbing aching pain in both shoulders associated with some numbness in the left hand. He reports catching in both shoulders constant 7 out of 10 pain, weakness, and decreased range of motion. No trauma.  He did not bring any of his medications so we cannot check those today.  Review of systems sinusitis sore throat dental problems; vision problems; cough; ankle swelling; constipation; difficulty urinating; hayfever and seasonal allergies. Numbness. Joint pain with gait disturbance and stiff joints.  Outside reports reviewed: none.  The following portions of the patient's history were reviewed and updated as appropriate: allergies, current medications, past family history, past medical history, past social history, past surgical history and problem list.    Objective:    General:  alert, cooperative and no distress  Gait:    Right Shoulder Bruising:   absent  Crepitus:  absent  Joint Tenderness:  none  Effusion:   none present  Biceps Tendon Rupture:   absent  Winging Scapula:   negative  Adson's:  negative  Active ROM:   Fort elevation 100  Neer:   positive  Hawkins  positive  Stability:   normal  Apprehension Sign:   negative/negative  Atrophy:   none noted  Strength:  supraspinatus weakness   Left shoulder no bruising or crepitance no joint tenderness. No effusion. Biceps tendon rupture absent. Winging of the scapula negative Adson negative. Forward elevation is approximately 100. Positive Neer and Hawkins maneuvers with negative apprehension and no instability no atrophy and weakness  of the supraspinatus tendon  Cervical spine nontender with full range of motion normal muscle tone and normal skin  Thoracic spine no abnormality to palpation. No deformity. Skin normal muscle tone normal no loss of spinal flexion at the thoracic region.  He was tender in both shoulders posterior to the acromion and inferior to the acromion. Imaging X-Ray: not taken secondary to system being down    Assessment:    bilateral rotator cuff syndrome     Plan:  Recommend bilateral shoulder injections and topical diclofenac with menthol and lidocaine. Applied to his left elbow as well which was tender  Addendum tenderness over the left lateral and posterior aspects of his elbow with no range of motion strength deficit or instability

## 2013-12-28 ENCOUNTER — Encounter (HOSPITAL_COMMUNITY): Payer: Self-pay

## 2013-12-28 ENCOUNTER — Ambulatory Visit (HOSPITAL_COMMUNITY)
Admission: RE | Admit: 2013-12-28 | Discharge: 2013-12-28 | Disposition: A | Discharge status: Home / Self Care | Payer: BC Managed Care – PPO | Source: Ambulatory Visit | Attending: Orthopedic Surgery | Admitting: Orthopedic Surgery

## 2013-12-28 DIAGNOSIS — R29898 Other symptoms and signs involving the musculoskeletal system: Secondary | ICD-10-CM

## 2013-12-28 DIAGNOSIS — M6281 Muscle weakness (generalized): Secondary | ICD-10-CM | POA: Insufficient documentation

## 2013-12-28 DIAGNOSIS — M25619 Stiffness of unspecified shoulder, not elsewhere classified: Secondary | ICD-10-CM

## 2013-12-28 DIAGNOSIS — Z5189 Encounter for other specified aftercare: Secondary | ICD-10-CM | POA: Insufficient documentation

## 2013-12-28 DIAGNOSIS — M25611 Stiffness of right shoulder, not elsewhere classified: Secondary | ICD-10-CM | POA: Diagnosis not present

## 2013-12-28 DIAGNOSIS — M25511 Pain in right shoulder: Secondary | ICD-10-CM | POA: Diagnosis not present

## 2013-12-28 DIAGNOSIS — M25612 Stiffness of left shoulder, not elsewhere classified: Secondary | ICD-10-CM | POA: Diagnosis not present

## 2013-12-28 DIAGNOSIS — M25512 Pain in left shoulder: Secondary | ICD-10-CM | POA: Insufficient documentation

## 2013-12-28 DIAGNOSIS — M25519 Pain in unspecified shoulder: Secondary | ICD-10-CM

## 2013-12-28 NOTE — Patient Instructions (Signed)
SHOULDER: Flexion On Table   Place hands on table, elbows straight. Move hips away from body. Press hands down into table, or clasp together. 2-3 minutes , 2-3 sets per day.  Abduction (Passive)   With arm out to side, resting on table, lower head toward arm, keeping trunk away from table. Repeat 2-3 minutes . Do 2-3 sessions per day.  Copyright  VHI. All rights reserved.     Internal Rotation (Assistive)   Seated with elbow bent at right angle and held against side, slide arm on table surface in an inward arc. Repeat 2-3 minutes. Do 2-3 sessions per day. Activity: Use this motion to brush crumbs off the table.  Copyright  VHI. All rights reserved.   Scalene Stretch, Sitting  Repeat __10_ times per session. Do _1-2__ sessions per day.  Copyright  VHI. All rights reserved.  Scalene Stretch, Sitting   Sit, one hand tucked under hip on side to be stretched, other hand over top of head. Gently pull head to side and backwards. Hold 5 seconds. For more stretch, lean body in direction of head pull. Repeat 2-3 times per session. Do 2-3 sessions per day.  Copyright  VHI. All rights reserved.   Lower Cervical / Upper Thoracic Stretch   Clasp hands together in front with arms extended. Gently pull shoulder blades apart and bend head forward. Hold 5-10 seconds. Repeat 5 times per set.  Do 2-3 sessions per day.  http://orth.exer.us/354   Copyright  VHI. All rights reserved.   Bea Graff Janiqua Friscia, MS, OTR/L [place]

## 2013-12-28 NOTE — Therapy (Signed)
Occupational Therapy Evaluation  Patient Details  Name: Bob Maldonado MRN: [id number] Date of Birth: [birth year]  Encounter Date: 12/28/2013      OT End of Session - 12/28/13 1428    Visit Number 1   Number of Visits 24   Date for OT Re-Evaluation 01/25/14   Authorization Type BCBS   Authorization Time Period n/a   OT Start Time 1310   OT Stop Time 1410   OT Time Calculation (min) 60 min   Activity Tolerance Patient tolerated treatment well   Behavior During Therapy [facility] for tasks assessed/performed      Past Medical History  Diagnosis Date  . Diabetes mellitus without complication   . Hypertension   . Hyperlipidemia   . Gout   . Cancer     prostate / Declines treatment  . Chronic kidney disease     ESRD- Declines Dialysis  . Renal insufficiency   . Prostate cancer 03/16/11, 08/29/13    Gleason 7, volume 55 mL  . Dialysis patient   . Arthritis     Past Surgical History  Procedure Laterality Date  . Right knee  1985  . Insertion of dialysis catheter Right 05/30/2013    Procedure: INSERTION OF DIALYSIS CATHETER;  Surgeon: Conrad Lorena, MD;  Location: [place];  Service: Vascular;  Laterality: Right;  . Av fistula placement Left 05/30/2013    Procedure: Brachial vein transposition 1st stage;  Surgeon: Conrad Young, MD;  Location: [place];  Service: Vascular;  Laterality: Left;  . Colonoscopy    . Bascilic vein transposition Left 07/25/2013    Procedure: SECOND STAGE BRACHIAL VEIN TRANSPOSITION;  Surgeon: Conrad [place], MD;  Location: [place];  Service: Vascular;  Laterality: Left;  . Prostate biopsy  02/2011    Gleason 7  . Prostate biopsy  08/29/13    Gleason 7, volume 55 mL    There were no vitals taken for this visit.  Visit Diagnosis:  Pain in joint, shoulder region, unspecified laterality  Shoulder weakness  Decreased range of motion of shoulder, unspecified laterality      Subjective Assessment - 12/28/13 1412    Symptoms "I really don't know what happened.  i  was in the hospital back in April and I coud only sleep on my sides. They've just been hurting."   Pertinent History Pt is 58 yo male presenting to outpatient OT with bilateral rotator cuff syndrome.  Pt indicates in April he was hospitilized for kidney failure and was instructed to sleep only on his sides to decrease potential pressure on his kidneys while sleeping supine.  His pain has contniued to increase since April, and pt is still only sleeping on his sides.  Pt consulted with Dr. Aline Brochure on Monday 11/9, who provided cortison injections and a topical cream. Pt indicae the cream helps with pain but the shots has yet to be effective.   Limitations Progress as tolerated   Special Tests FOTO 43/100   Currently in Pain? No/denies   Pain Score --  at worst 8/10 with reaching tasks          [facility] OT Assessment - 12/28/13 1416    Assessment   Diagnosis Bilateral Rotator Cuff syndrome   Onset Date --  april 2015   Precautions   Precautions None   Restrictions   Weight Bearing Restrictions No   Balance Screen   Has the patient fallen in the past 6 months No   Home  Environment  Family/patient expects to be discharged to: Private residence   Available Help at Discharge Family   Prior Function   Level of [place] with basic ADLs;Independent with homemaking with ambulation   Vocation On disability  due to kidneys   Teacher, 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  ADLs involving reaching.   Written Expression   Dominant Hand Right   Vision - History   Baseline Vision Wears glasses all the time   Observation/Other Assessments   Observations Left - moderate fascial restrictions in bicep, upper trap. max restrictions in scap region with mod tenderness.  Right - max restrictions in bicep, upper arm, upper trap, and scap regions.      Focus on Therapeutic Outcomes (FOTO)  43/100   Sensation   Additional Comments Pt indicates numbness in L ulnar region during dialysis treatments.  Numbness resolves with end of treatment and movement in his arm   Coordination   Gross Motor Movements are Fluid and Coordinated Yes   Fine Motor Movements are Fluid and Coordinated Yes   AROM   Overall AROM Comments Assess in standing, with ER/IR adducted   Right Shoulder Flexion 61 Degrees   Right Shoulder ABduction 74 Degrees   Right Shoulder Internal Rotation 83 Degrees   Right Shoulder External Rotation 23 Degrees   Left Shoulder Flexion 70 Degrees   Left Shoulder ABduction 73 Degrees   Left Shoulder Internal Rotation 87 Degrees   Left Shoulder External Rotation 24 Degrees   Cervical Flexion 6.5 cm   Cervical Extension 17 cm   Cervical - Right Side Bend 22.5 cm   Cervical - Left Side Bend 21 cm   Cervical - Right Rotation 22 cm   Cervical - Left Rotation 22 cm   PROM   Overall PROM Comments Assess in supine, with ER/IR adducted   Right Shoulder Flexion 53 Degrees   Right Shoulder ABduction 77 Degrees   Right Shoulder Internal Rotation 67 Degrees   Right Shoulder External Rotation 29 Degrees   Left Shoulder Flexion 60 Degrees   Left Shoulder ABduction 65 Degrees   Left Shoulder Internal Rotation 81 Degrees   Left Shoulder External Rotation 16 Degrees   Strength   Right Shoulder Flexion --  4-/5   Right Shoulder ABduction --  4-/5   Right Shoulder Internal Rotation --  4+/5   Right Shoulder External Rotation --  4+/5   Left Shoulder Flexion 3+/5   Left Shoulder ABduction 3/5   Left Shoulder Internal Rotation 4/5   Left Shoulder External Rotation 4/5            OT Education - 12/28/13 1427    Education provided Yes   Education Details HEP for table slides and cervicl neck stretches   Person(s) Educated Patient   Methods Explanation;Demonstration;Handout   Comprehension Verbalized understanding          OT  Short Term Goals - 12/28/13 1435    OT SHORT TERM GOAL #1   Title Pt will be educated on HEP   Time 4   Period Weeks   Status New   OT SHORT TERM GOAL #2   Title Pt will improve BUE shoulder PROM to [facility] in working towards improved abilities donning a shirt   Time 4   Period Weeks   Status New   OT SHORT TERM GOAL #3   Title Pt  will decrease fascial restrictions to min-mod in BUE for overall decreased pain   Time 4   Period Weeks   Status New   OT SHORT TERM GOAL #4   Title Pt will increase strength to grossly 4/5 in BUE for improved ability to hold onto bike handlebars   Time 4   Period Weeks   Status New   OT SHORT TERM GOAL #5   Title Pt's will decrease pain to less than 4/10 during daily activites   Time 4   Period Weeks   Status New          OT Long Term Goals - 12/28/13 1438    OT LONG TERM GOAL #1   Title Pt will acheive highest level of functioning in all ADL, IADL, work, and leisure activies.   Time 8   Period Weeks   Status New   OT LONG TERM GOAL #2   Title Pt will achieve BUE shoulder AROM WFL for improved ability to play a round of golf   Time 8   Period Weeks   Status New   OT LONG TERM GOAL #3   Title Pt will decrease fascial restrictions in BUE to minimal or less for decreased overall pain   Time 8   Period Weeks   Status New   OT LONG TERM GOAL #4   Title Pt will improve strength in BUE to 5/5 for improved ability to engage in overhead reaching tasks   Time 8   Period Weeks   Status New   OT LONG TERM GOAL #5   Title Pt will have pain in BUE of less than 2/10 when donning a coat   Time 8   Period Weeks   Status New          Plan - 12/28/13 1432    Clinical Impression Statement Pt is presenting to outpatient OT services with bilateral rotator cuff syndrome.  He has increased pain, increased fascial restrictions, decreased ROM, and decreased strength in bilateral shoulders, lmiting his ADL and IADL engagement.  Pt also indicates he has  increased tightness and pain in cervical neck region.     Rehab Potential Good   OT Frequency 3x / week   OT Duration 8 weeks   OT Treatment/Interventions Self-care/ADL training;Cryotherapy;Electrical Stimulation;Moist Heat;Ultrasound;Therapeutic exercise;Passive range of motion;Manual Therapy;Therapeutic exercises;Therapeutic activities;Patient/family education   Plan Pt will benefit from skilled OT services to decrease pain, increase ROM, decrease fascial restrictions, and improve strength for improved engagement in ADL and IADL activites.    Treatment Plan: MFR and manual stretching, AAROM, AROM, scapular strengthening, cervical neck stretches, proximal shoulder strengthening, general BUE strengthening.     OT Home Exercise Plan 11/12 provided table slides and cervical neck stretches   Consulted and Agree with Plan of Care Patient        Problem List Patient Active Problem List   Diagnosis Date Noted  . Rotator cuff syndrome of both shoulders 12/25/2013  . End stage renal disease 07/21/2013  . ESRD on dialysis 06/14/2013  . Generalized weakness 06/14/2013  . Peripheral edema 05/26/2013  . Hyperkalemia 05/26/2013  . Anemia of chronic renal failure 05/26/2013  . Hyperlipidemia 07/14/2012  . Gout 07/14/2012  . Diabetes mellitus with complication 0000000  . Essential hypertension, benign 12/15/2011  . Obesity 12/15/2011  . Prostate cancer 12/15/2011      Bea Graff Jeromy Borcherding, MS, OTR/L [place] [phone] 12/28/2013, 2:51 PM

## 2014-01-01 ENCOUNTER — Encounter (HOSPITAL_COMMUNITY): Payer: Self-pay

## 2014-01-01 ENCOUNTER — Ambulatory Visit (HOSPITAL_COMMUNITY)
Admission: RE | Admit: 2014-01-01 | Discharge: 2014-01-01 | Disposition: A | Discharge status: Home / Self Care | Payer: BC Managed Care – PPO | Source: Ambulatory Visit | Attending: Family Medicine | Admitting: Family Medicine

## 2014-01-01 DIAGNOSIS — M25519 Pain in unspecified shoulder: Secondary | ICD-10-CM

## 2014-01-01 DIAGNOSIS — M25619 Stiffness of unspecified shoulder, not elsewhere classified: Secondary | ICD-10-CM

## 2014-01-01 DIAGNOSIS — R29898 Other symptoms and signs involving the musculoskeletal system: Secondary | ICD-10-CM

## 2014-01-01 DIAGNOSIS — Z5189 Encounter for other specified aftercare: Secondary | ICD-10-CM | POA: Diagnosis not present

## 2014-01-01 NOTE — Patient Instructions (Signed)
ROM: Flexion - Wand   Bring wand directly over head, leading with right side. Reach back until stretch is felt. Hold __5__ seconds. Repeat __10__ times per set. Do __1__ sets per session. Do __2_ sessions per day.  http://orth.exer.us/744   Copyright  VHI. All rights reserved.   ROM: Abduction - Wand   Holding wand with left hand palm up, push wand directly out to side, leading with other hand palm down, until stretch is felt. Hold __5__ seconds. Repeat __10__ times per set. Do __1__ sets per session. Do __2__ sessions per day.  http://orth.exer.us/746   Copyright  VHI. All rights reserved.   ROM: Horizontal Abduction / Adduction - Wand   Keeping both palms down, push right hand across body with other hand. Then pull back across body, keeping arms parallel to floor. Do not allow trunk to twist. Hold _5___ seconds. Repeat _10___ times per set. Do __1__ sets per session. Do _2___ sessions per day.  http://orth.exer.us/752   Copyright  VHI. All rights reserved.   ROM: External / Internal Rotation - Wand   Holding wand with left hand palm up, push out from body with other hand, palm down. Keep both elbows bent. When stretch is felt, hold __5__ seconds. Repeat to other side, leading with same hand. Keep elbows bent. Repeat __10__ times per set. Do _1___ sets per session. Do __2__ sessions per day.  http://orth.exer.us/748   Copyright  VHI. All rights reserved.

## 2014-01-01 NOTE — Therapy (Addendum)
Occupational Therapy Treatment  Patient Details  Name: Bob Maldonado MRN: [id number] Date of Birth: 1955/07/21  Encounter Date: 01/01/2014      OT End of Session - 01/01/14 1341    Visit Number 1   Number of Visits 24   Date for OT Re-Evaluation 01/25/14   Authorization Type BCBS   OT Start Time 1300   OT Stop Time 1350   OT Time Calculation (min) 50 min   Activity Tolerance Patient tolerated treatment well   Behavior During Therapy [facility] for tasks assessed/performed      Past Medical History  Diagnosis Date  . Diabetes mellitus without complication   . Hypertension   . Hyperlipidemia   . Gout   . Cancer     prostate / Declines treatment  . Chronic kidney disease     ESRD- Declines Dialysis  . Renal insufficiency   . Prostate cancer 03/16/11, 08/29/13    Gleason 7, volume 55 mL  . Dialysis patient   . Arthritis     Past Surgical History  Procedure Laterality Date  . Right knee  1985  . Insertion of dialysis catheter Right 05/30/2013    Procedure: INSERTION OF DIALYSIS CATHETER;  Surgeon: Conrad [place]rea, MD;  Location: [place];  Service: Vascular;  Laterality: Right;  . Av fistula placement Left 05/30/2013    Procedure: Brachial vein transposition 1st stage;  Surgeon: Conrad Bangor, MD;  Location: [place];  Service: Vascular;  Laterality: Left;  . Colonoscopy    . Bascilic vein transposition Left 07/25/2013    Procedure: SECOND STAGE BRACHIAL VEIN TRANSPOSITION;  Surgeon: Conrad [place], MD;  Location: [place];  Service: Vascular;  Laterality: Left;  . Prostate biopsy  02/2011    Gleason 7  . Prostate biopsy  08/29/13    Gleason 7, volume 55 mL    There were no vitals taken for this visit.  Visit Diagnosis:  Pain in joint, shoulder region, unspecified laterality  Shoulder weakness  Decreased range of motion of shoulder, unspecified laterality      Subjective Assessment - 01/01/14 1330    Symptoms S: I'm really sore from doing those stretches.     Pain BUE shoulders  8/10, aching      [facility] OT Assessment - 01/01/14 1328    Precautions   Precautions None          OT Treatments/Exercises (OP) - 01/01/14 1329    Shoulder Exercises: Supine   Protraction PROM;AAROM;10 reps;Both   Horizontal ABduction PROM;AAROM;Both;10 reps   External Rotation PROM;AAROM;Both;10 reps   Internal Rotation PROM;AAROM;Both;10 reps   Flexion PROM;AAROM;Both;10 reps   ABduction PROM;AAROM;Both;10 reps   Manual Therapy   Manual Therapy Myofascial release   Myofascial Release Myofascial release and manual stretching to bilateral UE focusing on upper arm, trapezius and scapularis region.           OT Education - 01/01/14 1338    Education provided Yes   Education Details AAROM exercises   Person(s) Educated Patient   Methods Explanation;Demonstration;Handout   Comprehension Verbalized understanding;Returned demonstration          OT Short Term Goals - 01/01/14 1330    OT SHORT TERM GOAL #1   Title Pt will be educated on HEP   Time 4   Period Weeks   Status On-going   OT SHORT TERM GOAL #2   Title Pt will improve BUE shoulder PROM to [facility] in working towards improved abilities donning a shirt  Time 4   Period Weeks   Status On-going   OT SHORT TERM GOAL #3   Title Pt will decrease fascial restrictions to min-mod in BUE for overall decreased pain   Time 4   Period Weeks   Status On-going   OT SHORT TERM GOAL #4   Title Pt will increase strength to grossly 4/5 in BUE for improved ability to hold onto bike handlebars   Time 4   Period Weeks   Status On-going   OT SHORT TERM GOAL #5   Title Pt's will decrease pain to less than 4/10 during daily activites   Time 4   Period Weeks   Status On-going          OT Long Term Goals - 01/01/14 1330    OT LONG TERM GOAL #1   Title Pt will acheive highest level of functioning in all ADL, IADL, work, and leisure activies.   Time 8   Period Weeks   Status On-going   OT LONG TERM GOAL #2   Title Pt will  achieve BUE shoulder AROM WFL for improved ability to play a round of golf   Time 8   Period Weeks   Status On-going   OT LONG TERM GOAL #3   Title Pt will decrease fascial restrictions in BUE to minimal or less for decreased overall pain   Time 8   Period Weeks   Status On-going   OT LONG TERM GOAL #4   Title Pt will improve strength in BUE to 5/5 for improved ability to engage in overhead reaching tasks   Time 8   Period Weeks   Status On-going   OT LONG TERM GOAL #5   Title Pt will have pain in BUE of less than 2/10 when donning a coat   Time 8   Period Weeks   Status On-going          Plan - 01/01/14 1342    Clinical Impression Statement A: Initiated myofascial release, passives stretching and AAROM exercises. Patient tolerated well. Pt was given AAROM exercises for HEP. Pt with increased joint tightness in BUE shoulders in all ranges.    Plan P: AAROM Seated. pulleys.   OT Home Exercise Plan AAROM exercises        Problem List Patient Active Problem List   Diagnosis Date Noted  . Rotator cuff syndrome of both shoulders 12/25/2013  . End stage renal disease 07/21/2013  . ESRD on dialysis 06/14/2013  . Generalized weakness 06/14/2013  . Peripheral edema 05/26/2013  . Hyperkalemia 05/26/2013  . Anemia of chronic renal failure 05/26/2013  . Hyperlipidemia 07/14/2012  . Gout 07/14/2012  . Diabetes mellitus with complication 0000000  . Essential hypertension, benign 12/15/2011  . Obesity 12/15/2011  . Prostate cancer 12/15/2011      Ailene Ravel, OTR/L,CBIS   01/01/2014, 1:46 PM

## 2014-01-03 ENCOUNTER — Ambulatory Visit (HOSPITAL_COMMUNITY)
Admission: RE | Admit: 2014-01-03 | Discharge: 2014-01-03 | Disposition: A | Discharge status: Home / Self Care | Payer: BC Managed Care – PPO | Source: Ambulatory Visit | Attending: Family Medicine | Admitting: Family Medicine

## 2014-01-03 DIAGNOSIS — R29898 Other symptoms and signs involving the musculoskeletal system: Secondary | ICD-10-CM

## 2014-01-03 DIAGNOSIS — Z5189 Encounter for other specified aftercare: Secondary | ICD-10-CM | POA: Diagnosis not present

## 2014-01-03 DIAGNOSIS — M25519 Pain in unspecified shoulder: Secondary | ICD-10-CM

## 2014-01-03 DIAGNOSIS — M25619 Stiffness of unspecified shoulder, not elsewhere classified: Secondary | ICD-10-CM

## 2014-01-03 NOTE — Therapy (Addendum)
Occupational Therapy Treatment  Patient Details  Name: Bob Maldonado MRN: [id number] Date of Birth: 18-Feb-1955  Encounter Date: 01/03/2014      OT End of Session - 01/03/14 1448    Visit Number 3   Activity Tolerance Patient tolerated treatment well    total visits 24  Reassess date 01/25/14 Time 145-230  Past Medical History  Diagnosis Date  . Diabetes mellitus without complication   . Hypertension   . Hyperlipidemia   . Gout   . Cancer     prostate / Declines treatment  . Chronic kidney disease     ESRD- Declines Dialysis  . Renal insufficiency   . Prostate cancer 03/16/11, 08/29/13    Gleason 7, volume 55 mL  . Dialysis patient   . Arthritis     Past Surgical History  Procedure Laterality Date  . Right knee  1985  . Insertion of dialysis catheter Right 05/30/2013    Procedure: INSERTION OF DIALYSIS CATHETER;  Surgeon: Conrad [place], MD;  Location: [place];  Service: Vascular;  Laterality: Right;  . Av fistula placement Left 05/30/2013    Procedure: Brachial vein transposition 1st stage;  Surgeon: Conrad Bassett, MD;  Location: [place];  Service: Vascular;  Laterality: Left;  . Colonoscopy    . Bascilic vein transposition Left 07/25/2013    Procedure: SECOND STAGE BRACHIAL VEIN TRANSPOSITION;  Surgeon: Conrad [place], MD;  Location: [place];  Service: Vascular;  Laterality: Left;  . Prostate biopsy  02/2011    Gleason 7  . Prostate biopsy  08/29/13    Gleason 7, volume 55 mL    There were no vitals taken for this visit.  Visit Diagnosis:  Pain in joint, shoulder region, unspecified laterality  Decreased range of motion of shoulder, unspecified laterality  Shoulder weakness      Subjective Assessment - 01/03/14 1430    Symptoms S:  I couldnt do these moves last time, I think you really loosened me up.   Limitations Progress as tolerated   Currently in Pain? Yes   Pain Score 6    Pain Location Shoulder   Pain Orientation Left   Pain Type Acute pain   Multiple Pain  Sites Yes   Pain Score 4   Pain Type Acute pain   Pain Location Shoulder   Pain Orientation Right            OT Treatments/Exercises (OP) - 01/03/14 1440    Shoulder Exercises: Supine   Protraction PROM;10 reps;AAROM;Both;15 reps   Horizontal ABduction Both;PROM;10 reps;AAROM;15 reps   External Rotation Both;PROM;10 reps;AAROM;15 reps   Internal Rotation Both;PROM;10 reps;AAROM;15 reps   Flexion Both;PROM;10 reps;AAROM;15 reps   ABduction Both;PROM;10 reps;AAROM;15 reps   Manual Therapy   Manual Therapy Myofascial release   Myofascial Release Myofascial release and manual stretching to bilateral upper arm, scapular, and shoulder regions, upper trapezius, manual cervical traction, and MFR to SCM.          OT Education - 01/03/14 1437    Education Details ulnar nerve glides for left arm.  Also educated patient to avoid pressure directly on elbow at dialysis.  I recommended putting a pillow under elbow while at dialysis.   Person(s) Educated Patient   Methods Explanation;Verbal cues;Handout   Comprehension Verbalized understanding;Returned demonstration          OT Short Term Goals - 01/03/14 1449    OT SHORT TERM GOAL #1   Title Pt will be educated on HEP  Time 4   Period Weeks   Status On-going   OT SHORT TERM GOAL #2   Title Pt will improve BUE shoulder PROM to [facility] in working towards improved abilities donning a shirt   Time 4   Period Weeks   Status On-going   OT SHORT TERM GOAL #3   Title Pt will decrease fascial restrictions to min-mod in BUE for overall decreased pain   Time 4   Period Weeks   Status On-going   OT SHORT TERM GOAL #4   Title Pt will increase strength to grossly 4/5 in BUE for improved ability to hold onto bike handlebars   Time 4   Period Weeks   Status On-going   OT SHORT TERM GOAL #5   Title Pt's will decrease pain to less than 4/10 during daily activites   Time 4   Period Weeks   Status On-going          OT Long Term Goals -  01/03/14 1450    OT LONG TERM GOAL #1   Title Pt will acheive highest level of functioning in all ADL, IADL, work, and leisure activies.   Time 8   Period Weeks   Status On-going   OT LONG TERM GOAL #2   Title Pt will achieve BUE shoulder AROM WFL for improved ability to play a round of golf   Time 8   Period Weeks   Status On-going   OT LONG TERM GOAL #3   Title Pt will decrease fascial restrictions in BUE to minimal or less for decreased overall pain   Time 8   Period Weeks   Status On-going   OT LONG TERM GOAL #4   Title Pt will improve strength in BUE to 5/5 for improved ability to engage in overhead reaching tasks   Time 8   Period Weeks   Status On-going   OT LONG TERM GOAL #5   Title Pt will have pain in BUE of less than 2/10 when donning a coat   Time 8   Period Weeks   Status On-going          Plan - 01/03/14 1448    Clinical Impression Statement A: educated patient on ulnar nerve glides and to place cushion under elbow region to decrease pressure on elbow region.    Plan P:  Add pulleys, ball stretches, thumb tacks, and prot/ret//elev/dep.        Problem List Patient Active Problem List   Diagnosis Date Noted  . Rotator cuff syndrome of both shoulders 12/25/2013  . End stage renal disease 07/21/2013  . ESRD on dialysis 06/14/2013  . Generalized weakness 06/14/2013  . Peripheral edema 05/26/2013  . Hyperkalemia 05/26/2013  . Anemia of chronic renal failure 05/26/2013  . Hyperlipidemia 07/14/2012  . Gout 07/14/2012  . Diabetes mellitus with complication 61/44/3154  . Essential hypertension, benign 12/15/2011  . Obesity 12/15/2011  . Prostate cancer 12/15/2011    Vangie Bicker, OTR/L [phone]  01/03/2014,

## 2014-01-04 ENCOUNTER — Ambulatory Visit (HOSPITAL_COMMUNITY)
Admission: RE | Admit: 2014-01-04 | Discharge: 2014-01-04 | Disposition: A | Discharge status: Home / Self Care | Payer: BC Managed Care – PPO | Source: Ambulatory Visit | Attending: Family Medicine | Admitting: Family Medicine

## 2014-01-04 ENCOUNTER — Encounter (HOSPITAL_COMMUNITY): Payer: Self-pay

## 2014-01-04 DIAGNOSIS — R29898 Other symptoms and signs involving the musculoskeletal system: Secondary | ICD-10-CM

## 2014-01-04 DIAGNOSIS — M25519 Pain in unspecified shoulder: Secondary | ICD-10-CM

## 2014-01-04 DIAGNOSIS — M25619 Stiffness of unspecified shoulder, not elsewhere classified: Secondary | ICD-10-CM

## 2014-01-04 DIAGNOSIS — Z5189 Encounter for other specified aftercare: Secondary | ICD-10-CM | POA: Diagnosis not present

## 2014-01-04 NOTE — Therapy (Signed)
Occupational Therapy Treatment  Patient Details  Name: Bob Maldonado MRN: [id number] Date of Birth: Jan 30, 1956  Encounter Date: 01/04/2014      OT End of Session - 01/04/14 1206    Visit Number 4   Number of Visits 24   Date for OT Re-Evaluation 01/25/14   Authorization Type BCBS   Authorization Time Period n/a   OT Start Time 1123   OT Stop Time 1204   OT Time Calculation (min) 41 min   Activity Tolerance Patient tolerated treatment well   Behavior During Therapy [facility] for tasks assessed/performed      Past Medical History  Diagnosis Date  . Diabetes mellitus without complication   . Hypertension   . Hyperlipidemia   . Gout   . Cancer     prostate / Declines treatment  . Chronic kidney disease     ESRD- Declines Dialysis  . Renal insufficiency   . Prostate cancer 03/16/11, 08/29/13    Gleason 7, volume 55 mL  . Dialysis patient   . Arthritis     Past Surgical History  Procedure Laterality Date  . Right knee  1985  . Insertion of dialysis catheter Right 05/30/2013    Procedure: INSERTION OF DIALYSIS CATHETER;  Surgeon: [name], MD;  Location: [place];  Service: Vascular;  Laterality: Right;  . Av fistula placement Left 05/30/2013    Procedure: Brachial vein transposition 1st stage;  Surgeon: Conrad Ralston, MD;  Location: [place];  Service: Vascular;  Laterality: Left;  . Colonoscopy    . Bascilic vein transposition Left 07/25/2013    Procedure: SECOND STAGE BRACHIAL VEIN TRANSPOSITION;  Surgeon: Conrad [place], MD;  Location: [place];  Service: Vascular;  Laterality: Left;  . Prostate biopsy  02/2011    Gleason 7  . Prostate biopsy  08/29/13    Gleason 7, volume 55 mL    There were no vitals taken for this visit.  Visit Diagnosis:  Pain in joint, shoulder region, unspecified laterality  Decreased range of motion of shoulder, unspecified laterality  Shoulder weakness      Subjective Assessment - 01/04/14 1126    Symptoms "i had one of those nights last night. i  was just aching, aching, aching. i couldn't find a comfortable spot to sleep."   Limitations Progress as tolerated   Currently in Pain? Yes   Pain Score 6    Pain Location Shoulder   Pain Orientation Right   Pain Descriptors / Indicators Aching   Pain Type Acute pain            OT Treatments/Exercises (OP) - 01/04/14 1125    Neck Exercises: Supine   Cervical Rotation Both;5 reps   Lateral Flexion Both;5 reps   Shoulder Exercises: Supine   Protraction Both;PROM;5 reps   Horizontal ABduction PROM;Both;5 reps   External Rotation PROM;Both;5 reps   Internal Rotation PROM;Both;5 reps   Flexion PROM;Both;5 reps   ABduction PROM;Both;5 reps   Manual Therapy   Manual Therapy Myofascial release   Myofascial Release Myofascial release and manual stretching to bilateral upper arm, scapular, and shoulder regions, upper trapezius, manual cervical traction, and MFR to SCM.            OT Education - 01/03/14 1437    Education Details ulnar nerve glides for left arm.  Also educated patient to avoid pressure directly on elbow at dialysis.  I recommended putting a pillow under elbow while at dialysis.   Person(s) Educated Patient  Methods Explanation;Verbal cues;Handout   Comprehension Verbalized understanding;Returned demonstration          OT Short Term Goals - 01/04/14 1209    OT SHORT TERM GOAL #1   Title Pt will be educated on HEP   Status On-going   OT SHORT TERM GOAL #2   Title Pt will improve BUE shoulder PROM to [facility] in working towards improved abilities donning a shirt   Status On-going   OT SHORT TERM GOAL #3   Title Pt will decrease fascial restrictions to min-mod in BUE for overall decreased pain   Status On-going   OT SHORT TERM GOAL #4   Title Pt will increase strength to grossly 4/5 in BUE for improved ability to hold onto bike handlebars   Status On-going   OT SHORT TERM GOAL #5   Title Pt's will decrease pain to less than 4/10 during daily activites   Status  On-going          OT Long Term Goals - 01/04/14 1209    OT LONG TERM GOAL #1   Title Pt will acheive highest level of functioning in all ADL, IADL, work, and leisure activies.   Status On-going   OT LONG TERM GOAL #2   Title Pt will achieve BUE shoulder AROM WFL for improved ability to play a round of golf   Status On-going   OT LONG TERM GOAL #3   Title Pt will decrease fascial restrictions in BUE to minimal or less for decreased overall pain   Status On-going   OT LONG TERM GOAL #4   Title Pt will improve strength in BUE to 5/5 for improved ability to engage in overhead reaching tasks   Status On-going   OT LONG TERM GOAL #5   Title Pt will have pain in BUE of less than 2/10 when donning a coat   Status On-going          Plan - 01/04/14 1207    Clinical Impression Statement pt arrived late for session, so unable to complete all new activites described in plan.  pt iwth increasd pain in RUE over LUE this session.  in attempted to beign AAROM activites, pt requrested increased manual to cervical neck region, stating he would compelte AAROM at home.  Provided increased manual this session, per pt request, for decreased pain in BUE.   Plan Resume therapeutic activites.  Add pulleys, thumbtacks, ball stretches, and pro/ret/elev/dep as time allows.        Problem List Patient Active Problem List   Diagnosis Date Noted  . Rotator cuff syndrome of both shoulders 12/25/2013  . End stage renal disease 07/21/2013  . ESRD on dialysis 06/14/2013  . Generalized weakness 06/14/2013  . Peripheral edema 05/26/2013  . Hyperkalemia 05/26/2013  . Anemia of chronic renal failure 05/26/2013  . Hyperlipidemia 07/14/2012  . Gout 07/14/2012  . Diabetes mellitus with complication 61/60/7371  . Essential hypertension, benign 12/15/2011  . Obesity 12/15/2011  . Prostate cancer 12/15/2011     Bea Graff Cylan Borum, MS, OTR/L [place] [phone] 01/04/2014,  12:11 PM

## 2014-01-05 ENCOUNTER — Ambulatory Visit (HOSPITAL_COMMUNITY): Payer: BC Managed Care – PPO

## 2014-01-08 ENCOUNTER — Encounter (HOSPITAL_COMMUNITY): Payer: BC Managed Care – PPO

## 2014-01-08 ENCOUNTER — Ambulatory Visit (HOSPITAL_COMMUNITY)
Admission: RE | Admit: 2014-01-08 | Discharge: 2014-01-08 | Disposition: A | Discharge status: Home / Self Care | Payer: BC Managed Care – PPO | Source: Ambulatory Visit | Attending: Family Medicine | Admitting: Family Medicine

## 2014-01-08 DIAGNOSIS — Z5189 Encounter for other specified aftercare: Secondary | ICD-10-CM | POA: Diagnosis not present

## 2014-01-08 DIAGNOSIS — M25619 Stiffness of unspecified shoulder, not elsewhere classified: Secondary | ICD-10-CM

## 2014-01-08 DIAGNOSIS — R29898 Other symptoms and signs involving the musculoskeletal system: Secondary | ICD-10-CM

## 2014-01-08 DIAGNOSIS — M25519 Pain in unspecified shoulder: Secondary | ICD-10-CM

## 2014-01-08 NOTE — Therapy (Signed)
Occupational Therapy Treatment  Patient Details  Name: Bob Maldonado MRN: [id number] Date of Birth: 02-Dec-1955  Encounter Date: 01/08/2014      OT End of Session - 01/08/14 1546    Visit Number 5   Number of Visits 24   Date for OT Re-Evaluation 01/25/14   Authorization Type BCBS   OT Start Time 1155   OT Stop Time 1240   OT Time Calculation (min) 45 min   Activity Tolerance Patient tolerated treatment well   Behavior During Therapy [facility] for tasks assessed/performed      Past Medical History  Diagnosis Date  . Diabetes mellitus without complication   . Hypertension   . Hyperlipidemia   . Gout   . Cancer     prostate / Declines treatment  . Chronic kidney disease     ESRD- Declines Dialysis  . Renal insufficiency   . Prostate cancer 03/16/11, 08/29/13    Gleason 7, volume 55 mL  . Dialysis patient   . Arthritis     Past Surgical History  Procedure Laterality Date  . Right knee  1985  . Insertion of dialysis catheter Right 05/30/2013    Procedure: INSERTION OF DIALYSIS CATHETER;  Surgeon: Conrad Grasston, MD;  Location: [place];  Service: Vascular;  Laterality: Right;  . Av fistula placement Left 05/30/2013    Procedure: Brachial vein transposition 1st stage;  Surgeon: Conrad Keya Paha, MD;  Location: [place];  Service: Vascular;  Laterality: Left;  . Colonoscopy    . Bascilic vein transposition Left 07/25/2013    Procedure: SECOND STAGE BRACHIAL VEIN TRANSPOSITION;  Surgeon: Conrad [place], MD;  Location: [place];  Service: Vascular;  Laterality: Left;  . Prostate biopsy  02/2011    Gleason 7  . Prostate biopsy  08/29/13    Gleason 7, volume 55 mL    There were no vitals taken for this visit.  Visit Diagnosis:  Pain in joint, shoulder region, unspecified laterality  Decreased range of motion of shoulder, unspecified laterality  Shoulder weakness      Subjective Assessment - 01/08/14 1544    Symptoms S:  My right shoulder feels better, and my neck is still stiff as is my  left shoulder.   Currently in Pain? Yes   Pain Score 6    Pain Location Shoulder   Pain Orientation Left   Pain Descriptors / Indicators Aching   Pain Type Acute pain   Pain Score 2   Pain Type Acute pain   Pain Location Shoulder   Pain Orientation Right            OT Treatments/Exercises (OP) - 01/08/14 1337    Shoulder Exercises: Supine   Protraction Both;PROM;5 reps   Horizontal ABduction PROM;Both;5 reps   External Rotation PROM;Both;5 reps   Internal Rotation PROM;Both;5 reps   Flexion PROM;Both;5 reps   ABduction PROM;Both;5 reps   Manual Therapy   Manual Therapy Myofascial release   Myofascial Release Myofascial release and manual stretching to bilateral upper arm, scapular, and shoulder regions, upper trapezius, manual cervical traction, and MFR to SCM.               OT Short Term Goals - 01/08/14 1547    OT SHORT TERM GOAL #1   Title Pt will be educated on HEP   Status On-going   OT SHORT TERM GOAL #2   Title Pt will improve BUE shoulder PROM to [facility] in working towards improved abilities donning a shirt  Status On-going   OT SHORT TERM GOAL #3   Title Pt will decrease fascial restrictions to min-mod in BUE for overall decreased pain   Status On-going   OT SHORT TERM GOAL #4   Title Pt will increase strength to grossly 4/5 in BUE for improved ability to hold onto bike handlebars   Status On-going   OT SHORT TERM GOAL #5   Title Pt's will decrease pain to less than 4/10 during daily activites   Status On-going          OT Long Term Goals - 01/08/14 1547    OT LONG TERM GOAL #1   Title Pt will acheive highest level of functioning in all ADL, IADL, work, and leisure activies.   Status On-going   OT LONG TERM GOAL #2   Title Pt will achieve BUE shoulder AROM WFL for improved ability to play a round of golf   Status On-going   OT LONG TERM GOAL #3   Title Pt will decrease fascial restrictions in BUE to minimal or less for decreased overall pain    Status On-going   OT LONG TERM GOAL #4   Title Pt will improve strength in BUE to 5/5 for improved ability to engage in overhead reaching tasks   Status On-going   OT LONG TERM GOAL #5   Title Pt will have pain in BUE of less than 2/10 when donning a coat   Status On-going          Plan - 01/08/14 1546    Clinical Impression Statement A:  Patient had a significant decrease in pain in his neck and scapular regions and improvement in bilateral shoulder mobility after MFR.   Plan P:  Add ball stretches, thumbtacks, pulleys.        Problem List Patient Active Problem List   Diagnosis Date Noted  . Rotator cuff syndrome of both shoulders 12/25/2013  . End stage renal disease 07/21/2013  . ESRD on dialysis 06/14/2013  . Generalized weakness 06/14/2013  . Peripheral edema 05/26/2013  . Hyperkalemia 05/26/2013  . Anemia of chronic renal failure 05/26/2013  . Hyperlipidemia 07/14/2012  . Gout 07/14/2012  . Diabetes mellitus with complication 0000000  . Essential hypertension, benign 12/15/2011  . Obesity 12/15/2011  . Prostate cancer 12/15/2011   Vangie Bicker, OTR/L [phone]  01/08/2014, 3:48 PM

## 2014-01-10 ENCOUNTER — Encounter (HOSPITAL_COMMUNITY): Payer: BC Managed Care – PPO

## 2014-01-10 ENCOUNTER — Ambulatory Visit (HOSPITAL_COMMUNITY)
Admission: RE | Admit: 2014-01-10 | Discharge: 2014-01-10 | Disposition: A | Discharge status: Home / Self Care | Payer: BC Managed Care – PPO | Source: Ambulatory Visit | Attending: Orthopedic Surgery | Admitting: Orthopedic Surgery

## 2014-01-10 DIAGNOSIS — Z5189 Encounter for other specified aftercare: Secondary | ICD-10-CM | POA: Diagnosis not present

## 2014-01-10 DIAGNOSIS — R29898 Other symptoms and signs involving the musculoskeletal system: Secondary | ICD-10-CM

## 2014-01-10 DIAGNOSIS — M25519 Pain in unspecified shoulder: Secondary | ICD-10-CM

## 2014-01-10 DIAGNOSIS — M25619 Stiffness of unspecified shoulder, not elsewhere classified: Secondary | ICD-10-CM

## 2014-01-10 NOTE — Therapy (Signed)
Occupational Therapy Treatment  Patient Details  Name: Bob Maldonado MRN: [id number] Date of Birth: 1955-04-17  Encounter Date: 01/10/2014      OT End of Session - 01/10/14 1243    Visit Number 6   Number of Visits 24   Date for OT Re-Evaluation 01/25/14   Authorization Type BCBS   OT Start Time 1150   OT Stop Time 1235   OT Time Calculation (min) 45 min   Activity Tolerance Patient tolerated treatment well   Behavior During Therapy [facility] for tasks assessed/performed      Past Medical History  Diagnosis Date  . Diabetes mellitus without complication   . Hypertension   . Hyperlipidemia   . Gout   . Cancer     prostate / Declines treatment  . Chronic kidney disease     ESRD- Declines Dialysis  . Renal insufficiency   . Prostate cancer 03/16/11, 08/29/13    Gleason 7, volume 55 mL  . Dialysis patient   . Arthritis     Past Surgical History  Procedure Laterality Date  . Right knee  1985  . Insertion of dialysis catheter Right 05/30/2013    Procedure: INSERTION OF DIALYSIS CATHETER;  Surgeon: Conrad [place], MD;  Location: [place];  Service: Vascular;  Laterality: Right;  . Av fistula placement Left 05/30/2013    Procedure: Brachial vein transposition 1st stage;  Surgeon: Conrad Morganza, MD;  Location: [place];  Service: Vascular;  Laterality: Left;  . Colonoscopy    . Bascilic vein transposition Left 07/25/2013    Procedure: SECOND STAGE BRACHIAL VEIN TRANSPOSITION;  Surgeon: Conrad Kirkland, MD;  Location: [place];  Service: Vascular;  Laterality: Left;  . Prostate biopsy  02/2011    Gleason 7  . Prostate biopsy  08/29/13    Gleason 7, volume 55 mL    There were no vitals taken for this visit.  Visit Diagnosis:  Pain in joint, shoulder region, unspecified laterality  Decreased range of motion of shoulder, unspecified laterality  Shoulder weakness      Subjective Assessment - 01/10/14 1218    Symptoms S: The stretching helps my neck feel better.   Currently in Pain? Yes   Pain Score 6    Pain Location Shoulder   Pain Orientation Left   Pain Descriptors / Indicators Aching   Pain Type Acute pain   Multiple Pain Sites Yes   Pain Score 4   Pain Type Acute pain   Pain Location Shoulder   Pain Orientation Right   Pain Descriptors / Indicators Aching          [facility] OT Assessment - 01/10/14 1221    Precautions   Precautions None          OT Treatments/Exercises (OP) - 01/10/14 1221    Shoulder Exercises: Supine   Protraction PROM;AAROM;10 reps;Both   Horizontal ABduction PROM;AAROM;10 reps;Both   External Rotation PROM;AAROM;Both;10 reps   Internal Rotation PROM;AAROM;Both;10 reps   Flexion PROM;AAROM;Both;10 reps   ABduction PROM;AAROM;Both;10 reps   Shoulder Exercises: ROM/Strengthening   Thumb Tacks 1'   Manual Therapy   Manual Therapy Myofascial release   Myofascial Release Myofascial release and manual stretching to bilateral upper arm, scapular, and shoulder regions, upper trapezius, manual cervical traction, and MFR to SCM.              OT Short Term Goals - 01/10/14 1220    OT SHORT TERM GOAL #1   Title Pt will be educated  on HEP   Time 4   Period Weeks   Status On-going   OT SHORT TERM GOAL #2   Title Pt will improve BUE shoulder PROM to [facility] in working towards improved abilities donning a shirt   Time 4   Period Weeks   Status On-going   OT SHORT TERM GOAL #3   Title Pt will decrease fascial restrictions to min-mod in BUE for overall decreased pain   Time 4   Period Weeks   Status On-going   OT SHORT TERM GOAL #4   Title Pt will increase strength to grossly 4/5 in BUE for improved ability to hold onto bike handlebars   Time 4   Period Weeks   Status On-going   OT SHORT TERM GOAL #5   Title Pt's will decrease pain to less than 4/10 during daily activites   Time 4   Period Weeks   Status On-going          OT Long Term Goals - 01/10/14 1220    OT LONG TERM GOAL #1   Title Pt will acheive highest level of  functioning in all ADL, IADL, work, and leisure activies.   Time 8   Period Weeks   Status On-going   OT LONG TERM GOAL #2   Title Pt will achieve BUE shoulder AROM WFL for improved ability to play a round of golf   Time 8   Period Weeks   Status On-going   OT LONG TERM GOAL #3   Title Pt will decrease fascial restrictions in BUE to minimal or less for decreased overall pain   Time 8   Period Weeks   Status On-going   OT LONG TERM GOAL #4   Title Pt will improve strength in BUE to 5/5 for improved ability to engage in overhead reaching tasks   Status On-going   OT LONG TERM GOAL #5   Title Pt will have pain in BUE of less than 2/10 when donning a coat   Time 8   Period Weeks   Status On-going          Plan - 01/10/14 1243    Clinical Impression Statement A: Patient reports that left shoulder is worse vs. right shoulder. Patient had great response to trigger point release with left upper trapezius. Added AAROM supine and thumb tacks. patient tolerated well. Unable to add therapy ball and pulleys due to time constraint.    Plan P: Add therapy ball, pulleys, and AAROM standing.         Problem List Patient Active Problem List   Diagnosis Date Noted  . Rotator cuff syndrome of both shoulders 12/25/2013  . End stage renal disease 07/21/2013  . ESRD on dialysis 06/14/2013  . Generalized weakness 06/14/2013  . Peripheral edema 05/26/2013  . Hyperkalemia 05/26/2013  . Anemia of chronic renal failure 05/26/2013  . Hyperlipidemia 07/14/2012  . Gout 07/14/2012  . Diabetes mellitus with complication 0000000  . Essential hypertension, benign 12/15/2011  . Obesity 12/15/2011  . Prostate cancer 12/15/2011     Ailene Ravel, OTR/L,CBIS  [phone]  01/10/2014, 12:47 PM

## 2014-01-15 ENCOUNTER — Ambulatory Visit (HOSPITAL_COMMUNITY)
Admission: RE | Admit: 2014-01-15 | Discharge: 2014-01-15 | Disposition: A | Discharge status: Home / Self Care | Payer: BC Managed Care – PPO | Source: Ambulatory Visit | Attending: Family Medicine | Admitting: Family Medicine

## 2014-01-15 DIAGNOSIS — M25619 Stiffness of unspecified shoulder, not elsewhere classified: Secondary | ICD-10-CM

## 2014-01-15 DIAGNOSIS — M25519 Pain in unspecified shoulder: Secondary | ICD-10-CM

## 2014-01-15 DIAGNOSIS — R29898 Other symptoms and signs involving the musculoskeletal system: Secondary | ICD-10-CM

## 2014-01-15 DIAGNOSIS — Z5189 Encounter for other specified aftercare: Secondary | ICD-10-CM | POA: Diagnosis not present

## 2014-01-15 NOTE — Therapy (Signed)
Occupational Therapy Treatment  Patient Details  Name: Bob Maldonado MRN: [id number] Date of Birth: [birth year]  Encounter Date: 01/15/2014      OT End of Session - 01/15/14 1507    Visit Number 7   Number of Visits 24   Date for OT Re-Evaluation 01/25/14   Authorization Type BCBS   OT Start Time 1401   OT Stop Time 1440   OT Time Calculation (min) 39 min   Activity Tolerance Patient tolerated treatment well   Behavior During Therapy [facility] for tasks assessed/performed      Past Medical History  Diagnosis Date  . Diabetes mellitus without complication   . Hypertension   . Hyperlipidemia   . Gout   . Cancer     prostate / Declines treatment  . Chronic kidney disease     ESRD- Declines Dialysis  . Renal insufficiency   . Prostate cancer 03/16/11, 08/29/13    Gleason 7, volume 55 mL  . Dialysis patient   . Arthritis     Past Surgical History  Procedure Laterality Date  . Right knee  1985  . Insertion of dialysis catheter Right 05/30/2013    Procedure: INSERTION OF DIALYSIS CATHETER;  Surgeon: Conrad Ambler, MD;  Location: [place];  Service: Vascular;  Laterality: Right;  . Av fistula placement Left 05/30/2013    Procedure: Brachial vein transposition 1st stage;  Surgeon: Conrad [place], MD;  Location: [place];  Service: Vascular;  Laterality: Left;  . Colonoscopy    . Bascilic vein transposition Left 07/25/2013    Procedure: SECOND STAGE BRACHIAL VEIN TRANSPOSITION;  Surgeon: Conrad [place], MD;  Location: [place];  Service: Vascular;  Laterality: Left;  . Prostate biopsy  02/2011    Gleason 7  . Prostate biopsy  08/29/13    Gleason 7, volume 55 mL    There were no vitals taken for this visit.  Visit Diagnosis:  Pain in joint, shoulder region, unspecified laterality  Decreased range of motion of shoulder, unspecified laterality  Shoulder weakness      Subjective Assessment - 01/15/14 1433    Symptoms S:  I have a pinch in my left shoulder and my right is a bit tight too.     Currently in Pain? Yes   Pain Score 5    Pain Location Shoulder   Pain Orientation Left   Pain Descriptors / Indicators Aching   Pain Type Acute pain   Pain Score 4   Pain Type Acute pain   Pain Location Shoulder   Pain Orientation Right   Pain Descriptors / Indicators Aching            OT Treatments/Exercises (OP) - 01/15/14 1435    Shoulder Exercises: Supine   Protraction PROM;10 reps   Horizontal ABduction PROM;10 reps   External Rotation PROM;10 reps   Internal Rotation PROM;10 reps   Flexion PROM;10 reps   ABduction PROM;10 reps   Shoulder Exercises: Standing   Extension Theraband;10 reps   Theraband Level (Shoulder Extension) Level 2 (Red)   Row Theraband;10 reps   Theraband Level (Shoulder Row) Level 2 (Red)   Retraction Theraband;10 reps   Theraband Level (Shoulder Retraction) Level 2 (Red)   Shoulder Exercises: ROM/Strengthening   UBE (Upper Arm Bike) 3 minutes in reverse at 1.0   Manual Therapy   Manual Therapy Myofascial release   Myofascial Release Myofascial release and manual stretching to bilateral upper arm, scapular, and shoulder regions, upper trapezius, manual cervical traction,  and MFR to SCM.              OT Short Term Goals - 01/15/14 1509    OT SHORT TERM GOAL #1   Title Pt will be educated on HEP   Time 4   Period Weeks   Status On-going   OT SHORT TERM GOAL #2   Title Pt will improve BUE shoulder PROM to [facility] in working towards improved abilities donning a shirt   Time 4   Period Weeks   Status On-going   OT SHORT TERM GOAL #3   Title Pt will decrease fascial restrictions to min-mod in BUE for overall decreased pain   Time 4   Period Weeks   Status On-going   OT SHORT TERM GOAL #4   Title Pt will increase strength to grossly 4/5 in BUE for improved ability to hold onto bike handlebars   Time 4   Period Weeks   Status On-going   OT SHORT TERM GOAL #5   Title Pt's will decrease pain to less than 4/10 during daily activites    Time 4   Period Weeks   Status On-going          OT Long Term Goals - 01/15/14 1509    OT LONG TERM GOAL #1   Title Pt will acheive highest level of functioning in all ADL, IADL, work, and leisure activies.   Time 8   Period Weeks   Status On-going   OT LONG TERM GOAL #2   Title Pt will achieve BUE shoulder AROM WFL for improved ability to play a round of golf   Time 8   Period Weeks   Status On-going   OT LONG TERM GOAL #3   Title Pt will decrease fascial restrictions in BUE to minimal or less for decreased overall pain   Time 8   Period Weeks   Status On-going   OT LONG TERM GOAL #4   Title Pt will improve strength in BUE to 5/5 for improved ability to engage in overhead reaching tasks   Status On-going   OT LONG TERM GOAL #5   Title Pt will have pain in BUE of less than 2/10 when donning a coat   Time 8   Period Weeks   Status On-going          Plan - 01/15/14 1508    Clinical Impression Statement A:  Patient states he has more available AROM into flexion and abduction than he has previously.     Plan P:  Increase to green theraband, add ER/IR with theraband, increase time on UBE.          Problem List Patient Active Problem List   Diagnosis Date Noted  . Rotator cuff syndrome of both shoulders 12/25/2013  . End stage renal disease 07/21/2013  . ESRD on dialysis 06/14/2013  . Generalized weakness 06/14/2013  . Peripheral edema 05/26/2013  . Hyperkalemia 05/26/2013  . Anemia of chronic renal failure 05/26/2013  . Hyperlipidemia 07/14/2012  . Gout 07/14/2012  . Diabetes mellitus with complication 0000000  . Essential hypertension, benign 12/15/2011  . Obesity 12/15/2011  . Prostate cancer 12/15/2011   Vangie Bicker, OTR/L [phone]  01/15/2014, 3:11 PM

## 2014-01-15 NOTE — Addendum Note (Signed)
Encounter addended by: Arbutus Ped, OTR on: 01/15/2014 10:07 AM<BR>     Documentation filed: Clinical Notes

## 2014-01-17 ENCOUNTER — Ambulatory Visit (HOSPITAL_COMMUNITY)
Admission: RE | Admit: 2014-01-17 | Discharge: 2014-01-17 | Disposition: A | Discharge status: Home / Self Care | Payer: BC Managed Care – PPO | Source: Ambulatory Visit | Attending: Family Medicine | Admitting: Family Medicine

## 2014-01-17 ENCOUNTER — Encounter (HOSPITAL_COMMUNITY): Payer: Self-pay

## 2014-01-17 DIAGNOSIS — M25619 Stiffness of unspecified shoulder, not elsewhere classified: Secondary | ICD-10-CM

## 2014-01-17 DIAGNOSIS — M25512 Pain in left shoulder: Secondary | ICD-10-CM | POA: Insufficient documentation

## 2014-01-17 DIAGNOSIS — M6281 Muscle weakness (generalized): Secondary | ICD-10-CM | POA: Insufficient documentation

## 2014-01-17 DIAGNOSIS — M25612 Stiffness of left shoulder, not elsewhere classified: Secondary | ICD-10-CM | POA: Diagnosis not present

## 2014-01-17 DIAGNOSIS — M25511 Pain in right shoulder: Secondary | ICD-10-CM | POA: Diagnosis not present

## 2014-01-17 DIAGNOSIS — M25611 Stiffness of right shoulder, not elsewhere classified: Secondary | ICD-10-CM | POA: Diagnosis not present

## 2014-01-17 DIAGNOSIS — M25519 Pain in unspecified shoulder: Secondary | ICD-10-CM

## 2014-01-17 DIAGNOSIS — Z5189 Encounter for other specified aftercare: Secondary | ICD-10-CM | POA: Diagnosis not present

## 2014-01-17 DIAGNOSIS — R29898 Other symptoms and signs involving the musculoskeletal system: Secondary | ICD-10-CM

## 2014-01-17 NOTE — Therapy (Signed)
[facility] [address] [place], [place], [address] Phone: [phone]   Fax:  [phone]  Occupational Therapy Treatment  Patient Details  Name: Bob Maldonado MRN: [id number] Date of Birth: 05/16/1955  Encounter Date: 01/17/2014      OT End of Session - 01/17/14 1653    Visit Number 8   Number of Visits 24   Date for OT Re-Evaluation 01/25/14   Authorization Type BCBS   Authorization Time Period n/a   OT Start Time 1306   OT Stop Time 1351   OT Time Calculation (min) 45 min   Activity Tolerance Patient tolerated treatment well   Behavior During Therapy [facility] for tasks assessed/performed      Past Medical History  Diagnosis Date  . Diabetes mellitus without complication   . Hypertension   . Hyperlipidemia   . Gout   . Cancer     prostate / Declines treatment  . Chronic kidney disease     ESRD- Declines Dialysis  . Renal insufficiency   . Prostate cancer 03/16/11, 08/29/13    Gleason 7, volume 55 mL  . Dialysis patient   . Arthritis     Past Surgical History  Procedure Laterality Date  . Right knee  1985  . Insertion of dialysis catheter Right 05/30/2013    Procedure: INSERTION OF DIALYSIS CATHETER;  Surgeon: Conrad Montgomery, MD;  Location: [place];  Service: Vascular;  Laterality: Right;  . Av fistula placement Left 05/30/2013    Procedure: Brachial vein transposition 1st stage;  Surgeon: Conrad [place] Pasco, MD;  Location: [place];  Service: Vascular;  Laterality: Left;  . Colonoscopy    . Bascilic vein transposition Left 07/25/2013    Procedure: SECOND STAGE BRACHIAL VEIN TRANSPOSITION;  Surgeon: Conrad , MD;  Location: [place];  Service: Vascular;  Laterality: Left;  . Prostate biopsy  02/2011    Gleason 7  . Prostate biopsy  08/29/13    Gleason 7, volume 55 mL    There were no vitals taken for this visit.  Visit Diagnosis:  Pain in joint, shoulder region, unspecified laterality  Decreased range of motion of shoulder, unspecified  laterality  Shoulder weakness      Subjective Assessment - 01/17/14 1308    Symptoms "its old faithful hurting me today. and its this weather getting into them both."   Currently in Pain? Yes   Pain Score 6    Pain Location Shoulder   Pain Orientation Right   Pain Descriptors / Indicators Aching   Multiple Pain Sites Yes   Pain Score 4   Pain Type Acute pain   Pain Location Shoulder   Pain Orientation Right            OT Treatments/Exercises (OP) - 01/17/14 1309    Shoulder Exercises: Supine   Protraction PROM;5 reps;Both   Horizontal ABduction PROM;5 reps;Both   External Rotation PROM;5 reps;Both   Internal Rotation PROM;5 reps;Both   Flexion PROM;5 reps;Both   ABduction PROM;5 reps;Both   Shoulder Exercises: Standing   Internal Rotation Theraband;12 reps  and ER   Theraband Level (Shoulder Internal Rotation) Level 3 (Green)   Extension Theraband;12 reps   Theraband Level (Shoulder Extension) Level 3 (Green)   Row [employer] reps   Theraband Level (Shoulder Row) Level 3 (Green)   Retraction Theraband;12 reps   Theraband Level (Shoulder Retraction) Level 3 (Green)   Shoulder Exercises: ROM/Strengthening   UBE (Upper Arm Bike) 2 min forward and 2  minutes back at 1.0   Manual Therapy   Manual Therapy Myofascial release   Myofascial Release Myofascial release and manual stretching to bilateral UE focusing on upper arm, trapezius and scapularis region.               OT Short Term Goals - 01/17/14 1654    OT SHORT TERM GOAL #1   Title Pt will be educated on HEP   Status On-going   OT SHORT TERM GOAL #2   Title Pt will improve BUE shoulder PROM to [facility] in working towards improved abilities donning a shirt   Status On-going   OT SHORT TERM GOAL #3   Title Pt will decrease fascial restrictions to min-mod in BUE for overall decreased pain   Status On-going   OT SHORT TERM GOAL #4   Title Pt will increase strength to grossly 4/5 in BUE for improved ability to  hold onto bike handlebars   Status On-going   OT SHORT TERM GOAL #5   Title Pt's will decrease pain to less than 4/10 during daily activites   Status On-going          OT Long Term Goals - 01/17/14 1654    OT LONG TERM GOAL #1   Title Pt will acheive highest level of functioning in all ADL, IADL, work, and leisure activies.   Status On-going   OT LONG TERM GOAL #2   Title Pt will achieve BUE shoulder AROM WFL for improved ability to play a round of golf   Status On-going   OT LONG TERM GOAL #3   Title Pt will decrease fascial restrictions in BUE to minimal or less for decreased overall pain   Status On-going   OT LONG TERM GOAL #4   Title Pt will improve strength in BUE to 5/5 for improved ability to engage in overhead reaching tasks   Status On-going   OT LONG TERM GOAL #5   Title Pt will have pain in BUE of less than 2/10 when donning a coat   Status On-going          Plan - 01/17/14 1653    Clinical Impression Statement pt still presenting wtih increased tightness along to upper trap region of bilateral shoulders - some relief with MFR this session.  Increased to green theraband with good tolerance.  pt experience some difficulty/tightness with ER/IR, but was able to tolerate.  Pt indicaed good stretche with UBE, and continued this session, with increased time to 4 min total.     Plan continue MFR and AAROM.  End session with UBE.        Problem List Patient Active Problem List   Diagnosis Date Noted  . Rotator cuff syndrome of both shoulders 12/25/2013  . End stage renal disease 07/21/2013  . ESRD on dialysis 06/14/2013  . Generalized weakness 06/14/2013  . Peripheral edema 05/26/2013  . Hyperkalemia 05/26/2013  . Anemia of chronic renal failure 05/26/2013  . Hyperlipidemia 07/14/2012  . Gout 07/14/2012  . Diabetes mellitus with complication 0000000  . Essential hypertension, benign 12/15/2011  . Obesity 12/15/2011  . Prostate cancer 12/15/2011    Bea Graff Selah Klang, MS, OTR/L [place] [phone] 01/17/2014, 4:56 PM

## 2014-01-19 ENCOUNTER — Ambulatory Visit (HOSPITAL_COMMUNITY)
Admission: RE | Admit: 2014-01-19 | Discharge: 2014-01-19 | Disposition: A | Discharge status: Home / Self Care | Payer: BC Managed Care – PPO | Source: Ambulatory Visit | Attending: Family Medicine | Admitting: Family Medicine

## 2014-01-19 DIAGNOSIS — R29898 Other symptoms and signs involving the musculoskeletal system: Secondary | ICD-10-CM

## 2014-01-19 DIAGNOSIS — M25519 Pain in unspecified shoulder: Secondary | ICD-10-CM

## 2014-01-19 DIAGNOSIS — M25619 Stiffness of unspecified shoulder, not elsewhere classified: Secondary | ICD-10-CM

## 2014-01-19 DIAGNOSIS — Z5189 Encounter for other specified aftercare: Secondary | ICD-10-CM | POA: Diagnosis not present

## 2014-01-19 NOTE — Therapy (Signed)
[facility] [address] [place], [place], [address] Phone: [phone]   Fax:  [phone]  Occupational Therapy Treatment  Patient Details  Name: Bob Maldonado MRN: [id number] Date of Birth: 1955/05/04  Encounter Date: 01/19/2014      OT End of Session - 01/19/14 1430    Visit Number 9   Number of Visits 24   Date for OT Re-Evaluation 01/25/14   Authorization Type BCBS   OT Start Time 1306   OT Stop Time 1359   OT Time Calculation (min) 53 min   Activity Tolerance Patient tolerated treatment well   Behavior During Therapy [facility] for tasks assessed/performed      Past Medical History  Diagnosis Date  . Diabetes mellitus without complication   . Hypertension   . Hyperlipidemia   . Gout   . Cancer     prostate / Declines treatment  . Chronic kidney disease     ESRD- Declines Dialysis  . Renal insufficiency   . Prostate cancer 03/16/11, 08/29/13    Gleason 7, volume 55 mL  . Dialysis patient   . Arthritis     Past Surgical History  Procedure Laterality Date  . Right knee  1985  . Insertion of dialysis catheter Right 05/30/2013    Procedure: INSERTION OF DIALYSIS CATHETER;  Surgeon: Conrad Ladoga, MD;  Location: [place];  Service: Vascular;  Laterality: Right;  . Av fistula placement Left 05/30/2013    Procedure: Brachial vein transposition 1st stage;  Surgeon: Conrad Cushing, MD;  Location: [place];  Service: Vascular;  Laterality: Left;  . Colonoscopy    . Bascilic vein transposition Left 07/25/2013    Procedure: SECOND STAGE BRACHIAL VEIN TRANSPOSITION;  Surgeon: Conrad Louisa, MD;  Location: [place];  Service: Vascular;  Laterality: Left;  . Prostate biopsy  02/2011    Gleason 7  . Prostate biopsy  08/29/13    Gleason 7, volume 55 mL    There were no vitals taken for this visit.  Visit Diagnosis:  Pain in joint, shoulder region, unspecified laterality  Decreased range of motion of shoulder, unspecified laterality  Shoulder weakness       Subjective Assessment - 01/19/14 1306    Symptoms S: The right side is doing fairly well.  Just have a hitch in it.   Limitations Progress as tolerated   Currently in Pain? Yes   Pain Score 5    Pain Location Shoulder   Pain Orientation Left   Pain Descriptors / Indicators Aching   Pain Type Acute pain   Pain Score 3   Pain Type Acute pain   Pain Location Shoulder   Pain Orientation Right   Pain Descriptors / Indicators Aching            OT Treatments/Exercises (OP) - 01/19/14 1424    Exercises   Exercises Shoulder   Shoulder Exercises: Supine   Protraction PROM;AAROM;10 reps   Horizontal ABduction PROM;5 reps;Both   External Rotation PROM;5 reps;Both   Internal Rotation PROM;5 reps;Both   Flexion PROM;10 reps;AAROM;15 reps;Both   ABduction PROM;5 reps;AAROM;10 reps;Both   Shoulder Exercises: Standing   Protraction AAROM;15 reps   External Rotation AAROM;10 reps   Internal Rotation AAROM;15 reps   Extension Theraband;12 reps   Theraband Level (Shoulder Extension) Level 3 (Green)   Row Theraband;12 reps   Theraband Level (Shoulder Row) Level 3 (Green)   Retraction Theraband;12 reps   Theraband Level (Shoulder Retraction) Level 3 (Green)  Other Standing Exercises cervical retraction 10 tmies   Shoulder Exercises: ROM/Strengthening   UBE (Upper Arm Bike) 3 minutes forward and 3 minutes reverse 1.0   Manual Therapy   Manual Therapy Myofascial release   Myofascial Release Myofascial release and manual stretching to bilateral upper arm, scapular, and shoulder regions, upper trapezius, manual cervical traction, and MFR to SCM.           OT Education - 01/19/14 1429    Education Details wall stretch into flexion   Person(s) Educated Patient   Methods Explanation;Demonstration   Comprehension Verbalized understanding          OT Short Term Goals - 01/19/14 1432    OT SHORT TERM GOAL #1   Title Pt will be educated on HEP   Status On-going   OT SHORT TERM  GOAL #2   Title Pt will improve BUE shoulder PROM to [facility] in working towards improved abilities donning a shirt   Status On-going   OT SHORT TERM GOAL #3   Title Pt will decrease fascial restrictions to min-mod in BUE for overall decreased pain   Status On-going   OT SHORT TERM GOAL #4   Title Pt will increase strength to grossly 4/5 in BUE for improved ability to hold onto bike handlebars   Status On-going   OT SHORT TERM GOAL #5   Title Pt's will decrease pain to less than 4/10 during daily activites   Status On-going          OT Long Term Goals - 01/19/14 1432    OT LONG TERM GOAL #1   Title Pt will acheive highest level of functioning in all ADL, IADL, work, and leisure activies.   Status On-going   OT LONG TERM GOAL #2   Title Pt will achieve BUE shoulder AROM WFL for improved ability to play a round of golf   Status On-going   OT LONG TERM GOAL #3   Title Pt will decrease fascial restrictions in BUE to minimal or less for decreased overall pain   Status On-going   OT LONG TERM GOAL #4   Title Pt will improve strength in BUE to 5/5 for improved ability to engage in overhead reaching tasks   Status On-going   OT LONG TERM GOAL #5   Title Pt will have pain in BUE of less than 2/10 when donning a coat   Status On-going          Plan - 01/19/14 1430    Clinical Impression Statement A:  Patient able to correct upright posture and neck to retracted position with verbal cuing.  Good stretch and improved mobility with AAROM flexion in supine and external rotation in seated this date.    Plan P:  Attempt wall wash, reassess for monthly progress note on 01/25/14.    Problem List Patient Active Problem List   Diagnosis Date Noted  . Rotator cuff syndrome of both shoulders 12/25/2013  . End stage renal disease 07/21/2013  . ESRD on dialysis 06/14/2013  . Generalized weakness 06/14/2013  . Peripheral edema 05/26/2013  . Hyperkalemia 05/26/2013  . Anemia of chronic renal  failure 05/26/2013  . Hyperlipidemia 07/14/2012  . Gout 07/14/2012  . Diabetes mellitus with complication 0000000  . Essential hypertension, benign 12/15/2011  . Obesity 12/15/2011  . Prostate cancer 12/15/2011    Vangie Bicker, OTR/L [phone]  01/19/2014, 2:33 PM

## 2014-01-22 ENCOUNTER — Ambulatory Visit (HOSPITAL_COMMUNITY)
Admission: RE | Admit: 2014-01-22 | Discharge: 2014-01-22 | Disposition: A | Discharge status: Home / Self Care | Payer: BC Managed Care – PPO | Source: Ambulatory Visit | Attending: Family Medicine | Admitting: Family Medicine

## 2014-01-22 ENCOUNTER — Encounter: Payer: Self-pay | Admitting: Family Medicine

## 2014-01-22 ENCOUNTER — Encounter (HOSPITAL_COMMUNITY): Payer: Self-pay

## 2014-01-22 ENCOUNTER — Ambulatory Visit (INDEPENDENT_AMBULATORY_CARE_PROVIDER_SITE_OTHER): Payer: BC Managed Care – PPO | Admitting: Family Medicine

## 2014-01-22 VITALS — BP 122/64 | HR 64 | Temp 99.1°F | Resp 16 | Ht 75.0 in | Wt 227.0 lb

## 2014-01-22 DIAGNOSIS — R29898 Other symptoms and signs involving the musculoskeletal system: Secondary | ICD-10-CM

## 2014-01-22 DIAGNOSIS — R05 Cough: Secondary | ICD-10-CM | POA: Insufficient documentation

## 2014-01-22 DIAGNOSIS — R059 Cough, unspecified: Secondary | ICD-10-CM

## 2014-01-22 DIAGNOSIS — M25519 Pain in unspecified shoulder: Secondary | ICD-10-CM

## 2014-01-22 DIAGNOSIS — Z5189 Encounter for other specified aftercare: Secondary | ICD-10-CM | POA: Diagnosis not present

## 2014-01-22 DIAGNOSIS — M25619 Stiffness of unspecified shoulder, not elsewhere classified: Secondary | ICD-10-CM

## 2014-01-22 DIAGNOSIS — J209 Acute bronchitis, unspecified: Secondary | ICD-10-CM

## 2014-01-22 MED ORDER — MOMETASONE FUROATE 50 MCG/ACT NA SUSP: 2.0000 | Freq: Every day | NASAL | Status: DC

## 2014-01-22 MED ORDER — GUAIFENESIN-CODEINE 100-10 MG/5ML PO SOLN: 5.0000 mL | Freq: Four times a day (QID) | ORAL | Status: DC | PRN

## 2014-01-22 NOTE — Progress Notes (Signed)
Patient ID: Bob Maldonado, male   DOB: 28-Jan-1956, 58 y.o.   MRN: [id number]   Subjective:    Patient ID: Bob Maldonado, male    DOB: [birth year], 58 y.o.   MRN: [id number]  Patient presents for Allergies  patient here with cough with production for the past 2 months. He's also had postnasal drip and mild sore throat. He's currently on dialysis and will be starting prostate cancer treatments in approximate one week. He's not had any significant fever. He's been using allergy medications such as Claritin and Zyrtec and Flonase with no improvement in his symptoms. His cough is worse at night but he has severe spells during the day. He denies any wheezing or shortness of breath associated    Review Of Systems:  GEN- denies fatigue, fever, weight loss,weakness, recent illness HEENT- denies eye drainage, change in vision, nasal discharge, CVS- denies chest pain, palpitations RESP-denies SOB, +cough, wheeze ABD- denies N/V, change in stools, abd pain GU- denies dysuria, hematuria, dribbling, incontinence MSK- denies joint pain, muscle aches, injury Neuro- denies headache, dizziness, syncope, seizure activity       Objective:    BP 122/64 mmHg  Pulse 64  Temp(Src) 99.1 F (37.3 C) (Oral)  Resp 16  Ht 6\' 3"  (1.905 m)  Wt 227 lb (102.967 kg)  BMI 28.37 kg/m2 GEN- NAD, alert and oriented x3 HEENT- PERRL, EOMI, non injected sclera, pink conjunctiva, MMM, oropharynx clear, TM clear bilat no effusion, enlarged turbinates, no maxillary sinus tenderness CVS- RRR, 2/6 SEM  RESP-mild upper airway congestion, normal WOB EXT- No edema, LUE Fistula + bruit Pulses- Radial, DP- 2+        Assessment & Plan:      Problem List Items Addressed This Visit    None    Visit Diagnoses    Cough    -  Primary    Relevant Orders       DG Chest 2 View    Acute bronchitis, unspecified organism        Will obtain CXR r/o atypical PNA, treat with antibiotics, pendind results, cough meds,  Nasnoex for post nasal drip       Note: This dictation was prepared with Dragon dictation along with smaller phrase technology. Any transcriptional errors that result from this process are unintentional.

## 2014-01-22 NOTE — Therapy (Signed)
[facility] [address] [place], [place], [address] Phone: [phone]   Fax:  [phone]  Occupational Therapy Treatment  Patient Details  Name: Bob Maldonado MRN: [id number] Date of Birth: 07/11/1955  Encounter Date: 01/22/2014      OT End of Session - 01/22/14 1609    Visit Number 10   Number of Visits 24   Date for OT Re-Evaluation 01/25/14   Authorization Type BCBS   Authorization Time Period n/a   OT Start Time [id number]   OT Stop Time 1350   OT Time Calculation (min) 43 min   Activity Tolerance Patient tolerated treatment well      Past Medical History  Diagnosis Date  . Diabetes mellitus without complication   . Hypertension   . Hyperlipidemia   . Gout   . Cancer     prostate / Declines treatment  . Chronic kidney disease     ESRD- Declines Dialysis  . Renal insufficiency   . Prostate cancer 03/16/11, 08/29/13    Gleason 7, volume 55 mL  . Dialysis patient   . Arthritis     Past Surgical History  Procedure Laterality Date  . Right knee  1985  . Insertion of dialysis catheter Right 05/30/2013    Procedure: INSERTION OF DIALYSIS CATHETER;  Surgeon: Conrad Follansbee, MD;  Location: [place];  Service: Vascular;  Laterality: Right;  . Av fistula placement Left 05/30/2013    Procedure: Brachial vein transposition 1st stage;  Surgeon: Conrad Formoso, MD;  Location: [place];  Service: Vascular;  Laterality: Left;  . Colonoscopy    . Bascilic vein transposition Left 07/25/2013    Procedure: SECOND STAGE BRACHIAL VEIN TRANSPOSITION;  Surgeon: Conrad Plum, MD;  Location: [place];  Service: Vascular;  Laterality: Left;  . Prostate biopsy  02/2011    Gleason 7  . Prostate biopsy  08/29/13    Gleason 7, volume 55 mL    There were no vitals taken for this visit.  Visit Diagnosis:  Pain in joint, shoulder region, unspecified laterality  Decreased range of motion of shoulder, unspecified laterality  Shoulder weakness      Subjective Assessment  - 01/22/14 1308    Symptoms "i ended up sleeping in my recliner tonight... the right just has a little catch in it today."   Currently in Pain? Yes   Pain Score 6    Pain Location Shoulder   Pain Orientation Left   Multiple Pain Sites Yes   Pain Score 3   Pain Location Shoulder   Pain Orientation Right            OT Treatments/Exercises (OP) - 01/22/14 1310    Shoulder Exercises: Supine   Protraction PROM;5 reps;AAROM;10 reps   Horizontal ABduction PROM;5 reps;AAROM;10 reps   External Rotation PROM;5 reps;AAROM;10 reps   Internal Rotation PROM;5 reps;AAROM;10 reps   Flexion PROM;5 reps;AAROM;10 reps   ABduction PROM;5 reps;AAROM;10 reps;Both   Shoulder Exercises: ROM/Strengthening   UBE (Upper Arm Bike) 3 minutes forward and 3 minutes reverse 1.0   Wall Wash 1 min with Right, 1 min with L   Manual Therapy   Manual Therapy Myofascial release   Myofascial Release Myofascial release and manual stretching to bilateral UE focusing on upper arm, trapezius and scapularis region.               OT Short Term Goals - 01/22/14 1343    OT SHORT TERM GOAL #1   Title Pt  will be educated on HEP   Status On-going   OT SHORT TERM GOAL #2   Title Pt will improve BUE shoulder PROM to [facility] in working towards improved abilities donning a shirt   Status On-going   OT SHORT TERM GOAL #3   Title Pt will decrease fascial restrictions to min-mod in BUE for overall decreased pain   Status On-going   OT SHORT TERM GOAL #4   Title Pt will increase strength to grossly 4/5 in BUE for improved ability to hold onto bike handlebars   Status On-going   OT SHORT TERM GOAL #5   Title Pt's will decrease pain to less than 4/10 during daily activites   Status On-going          OT Long Term Goals - 01/22/14 1344    OT LONG TERM GOAL #1   Title Pt will acheive highest level of functioning in all ADL, IADL, work, and leisure activies.   Status On-going   OT LONG TERM GOAL #2   Title Pt will  achieve BUE shoulder AROM WFL for improved ability to play a round of golf   Status On-going   OT LONG TERM GOAL #3   Title Pt will decrease fascial restrictions in BUE to minimal or less for decreased overall pain   Status On-going   OT LONG TERM GOAL #4   Title Pt will improve strength in BUE to 5/5 for improved ability to engage in overhead reaching tasks   Status On-going   OT LONG TERM GOAL #5   Title Pt will have pain in BUE of less than 2/10 when donning a coat   Status On-going          Plan - 01/22/14 1609    Clinical Impression Statement Pt indicated he is feeling improvements in RUE, but LUE pina has remained stable.  Continued MFR, PROM, and supine AAROM this session.  pt had most fascial restrictions along bilateral upper trap regions this session.  Tolerated well AAROM, but with some increased pain in abduction bilaterally. Added wall wash, with good tolerance.  Slight increase in pain with LUE. Pt requested to end session on UBE.      Plan Re-eval before 12/10.  Continue theraband.       Problem List Patient Active Problem List   Diagnosis Date Noted  . Rotator cuff syndrome of both shoulders 12/25/2013  . End stage renal disease 07/21/2013  . ESRD on dialysis 06/14/2013  . Generalized weakness 06/14/2013  . Peripheral edema 05/26/2013  . Hyperkalemia 05/26/2013  . Anemia of chronic renal failure 05/26/2013  . Hyperlipidemia 07/14/2012  . Gout 07/14/2012  . Diabetes mellitus with complication 0000000  . Essential hypertension, benign 12/15/2011  . Obesity 12/15/2011  . Prostate cancer 12/15/2011    Bea Graff Torrance Frech, MS, OTR/L [place] [phone] 01/22/2014, 4:11 PM

## 2014-01-22 NOTE — Patient Instructions (Signed)
Get Chest xray Cough medication New nasal steroid for post nasal drip I will get labs from Dr. Florene Glen   F/U 2 months

## 2014-01-24 ENCOUNTER — Encounter (HOSPITAL_COMMUNITY): Payer: Self-pay

## 2014-01-24 ENCOUNTER — Ambulatory Visit (HOSPITAL_COMMUNITY)
Admission: RE | Admit: 2014-01-24 | Discharge: 2014-01-24 | Disposition: A | Discharge status: Home / Self Care | Payer: BC Managed Care – PPO | Source: Ambulatory Visit | Attending: Family Medicine | Admitting: Family Medicine

## 2014-01-24 DIAGNOSIS — M25519 Pain in unspecified shoulder: Secondary | ICD-10-CM

## 2014-01-24 DIAGNOSIS — M25619 Stiffness of unspecified shoulder, not elsewhere classified: Secondary | ICD-10-CM

## 2014-01-24 DIAGNOSIS — R29898 Other symptoms and signs involving the musculoskeletal system: Secondary | ICD-10-CM

## 2014-01-24 DIAGNOSIS — Z5189 Encounter for other specified aftercare: Secondary | ICD-10-CM | POA: Diagnosis not present

## 2014-01-24 NOTE — Therapy (Signed)
[facility] [address] [place], [place], [address] Phone: [phone]   Fax:  [phone]  Occupational Therapy Treatment  Patient Details  Name: Bob Maldonado MRN: [id number] Date of Birth: [birth year]  Encounter Date: 01/24/2014      OT End of Session - 01/24/14 1628    Visit Number 11   Number of Visits 24   Date for OT Re-Evaluation 01/25/14   Authorization Type BCBS   Authorization Time Period n/a   OT Start Time 1304   OT Stop Time 1354   OT Time Calculation (min) 50 min   Activity Tolerance Patient tolerated treatment well   Behavior During Therapy [facility] for tasks assessed/performed      Past Medical History  Diagnosis Date  . Diabetes mellitus without complication   . Hypertension   . Hyperlipidemia   . Gout   . Cancer     prostate / Declines treatment  . Chronic kidney disease     ESRD- Declines Dialysis  . Renal insufficiency   . Prostate cancer 03/16/11, 08/29/13    Gleason 7, volume 55 mL  . Dialysis patient   . Arthritis     Past Surgical History  Procedure Laterality Date  . Right knee  1985  . Insertion of dialysis catheter Right 05/30/2013    Procedure: INSERTION OF DIALYSIS CATHETER;  Surgeon: Conrad Haven, MD;  Location: [place];  Service: Vascular;  Laterality: Right;  . Av fistula placement Left 05/30/2013    Procedure: Brachial vein transposition 1st stage;  Surgeon: Conrad [place]ber[place], MD;  Location: [place];  Service: Vascular;  Laterality: Left;  . Colonoscopy    . Bascilic vein transposition Left 07/25/2013    Procedure: SECOND STAGE BRACHIAL VEIN TRANSPOSITION;  Surgeon: Conrad Spalding, MD;  Location: [place];  Service: Vascular;  Laterality: Left;  . Prostate biopsy  02/2011    Gleason 7  . Prostate biopsy  08/29/13    Gleason 7, volume 55 mL    There were no vitals taken for this visit.  Visit Diagnosis:  Pain in joint, shoulder region, unspecified laterality  Decreased range of motion of shoulder, unspecified  laterality  Shoulder weakness      Subjective Assessment - 01/24/14 1303    Symptoms "the range of mtion has gotten better. at one point i couldn't lift my arm shoulder hiehgt, and now i can go a little higher. i was able to wash under my arms this morning."   Currently in Pain? Yes   Pain Score 5    Pain Location Shoulder   Pain Orientation Right   Pain Score 4   Pain Type Acute pain   Pain Location Shoulder   Pain Orientation Right          [facility] OT Assessment - 01/24/14 1307    Assessment   Diagnosis Bilateral Rotator Cuff syndrome   Onset Date --  April 2015   Precautions   Precautions None   Restrictions   Weight Bearing Restrictions No   Balance Screen   Has the patient fallen in the past 6 months No   Has the patient had a decrease in activity level because of a fear of falling?  No   Is the patient reluctant to leave their home because of a fear of falling?  No   Prior Function   Level of Independence Independent with basic ADLs;Independent with homemaking with ambulation   ADL   ADL comments pt reports improvemetns  in donnign sirts and coats, but still has slightly pian and difficulty.  Pt also reports imporvements in all reaching ADLs.     AROM   Overall AROM Comments Assessed in standing, with ER/IR adducted   Right Shoulder Flexion 132 Degrees  61   Right Shoulder ABduction 129 Degrees  74   Right Shoulder Internal Rotation 95 Degrees  83   Right Shoulder External Rotation 53 Degrees  23   Left Shoulder Flexion 103 Degrees  70   Left Shoulder ABduction 97 Degrees  73   Left Shoulder Internal Rotation 86 Degrees  87   Left Shoulder External Rotation 45 Degrees  24   Cervical Flexion 2 cm  6.5 cm   Cervical Extension 17 cm  17   Cervical - Right Side Bend 18.5 cm  22.5   Cervical - Left Side Bend 19 cm  21   Cervical - Right Rotation 17 cm  22   Cervical - Left Rotation 17 cm  22cm   PROM   Overall PROM Comments Assessed in supine, with  ER/IR adducted (initial measurements 11/12)   Right Shoulder Flexion 138 Degrees  53   Right Shoulder ABduction 129 Degrees  77   Right Shoulder Internal Rotation 89 Degrees  67   Right Shoulder External Rotation 56 Degrees  29   Left Shoulder Flexion 106 Degrees  60   Left Shoulder ABduction 110 Degrees  65   Left Shoulder Internal Rotation 95 Degrees  81   Left Shoulder External Rotation 56 Degrees  16   Strength   Right Shoulder Flexion 5/5  4_/5   Right Shoulder ABduction 5/5  4-/5   Right Shoulder Internal Rotation 5/5  4+/5   Right Shoulder External Rotation 5/5  4+/5   Left Shoulder Flexion --  current 4+/5 (3+/5)   Left Shoulder ABduction --  current 4+/5 (previous 3/5)   Left Shoulder Internal Rotation --  current 4+/5 (previou 4/5)   Left Shoulder External Rotation --  current 4+/5 (previous 4/5)          OT Treatments/Exercises (OP) - 01/24/14 1627    Shoulder Exercises: Supine   Protraction PROM;5 reps   Horizontal ABduction PROM;5 reps   External Rotation PROM;5 reps   Internal Rotation PROM;5 reps   Flexion PROM;5 reps   ABduction PROM;5 reps   Manual Therapy   Manual Therapy Myofascial release   Myofascial Release Myofascial release and manual stretching to bilateral UE focusing on upper arm, trapezius and scapularis region.               OT Short Term Goals - 01/24/14 1349    OT SHORT TERM GOAL #1   Title Pt will be educated on HEP   Status Achieved   OT SHORT TERM GOAL #2   Title Pt will improve BUE shoulder PROM to [facility] in working towards improved abilities donning a shirt   Status Achieved   OT SHORT TERM GOAL #3   Title Pt will decrease fascial restrictions to min-mod in BUE for overall decreased pain   Status Achieved   OT SHORT TERM GOAL #4   Title Pt will increase strength to grossly 4/5 in BUE for improved ability to hold onto bike handlebars   Status Achieved   OT SHORT TERM GOAL #5   Title Pt's will decrease pain to less  than 4/10 during daily activites   Status On-going          OT Long Term  Goals - 01/24/14 1351    OT LONG TERM GOAL #1   Title Pt will acheive highest level of functioning in all ADL, IADL, work, and leisure activies.   Status On-going   OT LONG TERM GOAL #2   Title Pt will achieve BUE shoulder AROM WFL for improved ability to play a round of golf   Status On-going   OT LONG TERM GOAL #3   Title Pt will decrease fascial restrictions in BUE to minimal or less for decreased overall pain   Status On-going   OT LONG TERM GOAL #4   Title Pt will improve strength in BUE to 5/5 for improved ability to engage in overhead reaching tasks   Status On-going   OT LONG TERM GOAL #5   Title Pt will have pain in BUE of less than 2/10 when donning a coat   Status On-going          Plan - 01/24/14 1628    Clinical Impression Statement Re-evaluation completed this session. Pt has progressed well in therapy.  He has met 4/5 STG and is making good progress towards 1/5 STG and 5/5 LTG.  Pt reports improvements in ADL and IADL activites and overall decreased pain levels.     Plan Resume therapeutic exercises. Complete FOTO.       Problem List Patient Active Problem List   Diagnosis Date Noted  . Rotator cuff syndrome of both shoulders 12/25/2013  . End stage renal disease 07/21/2013  . ESRD on dialysis 06/14/2013  . Generalized weakness 06/14/2013  . Peripheral edema 05/26/2013  . Hyperkalemia 05/26/2013  . Anemia of chronic renal failure 05/26/2013  . Hyperlipidemia 07/14/2012  . Gout 07/14/2012  . Diabetes mellitus with complication 37/16/9678  . Essential hypertension, benign 12/15/2011  . Obesity 12/15/2011  . Prostate cancer 12/15/2011    Bob Graff Serai Tukes, MS, OTR/L [place] [phone] 01/24/2014, 4:35 PM

## 2014-01-26 ENCOUNTER — Ambulatory Visit (HOSPITAL_COMMUNITY)
Admission: RE | Admit: 2014-01-26 | Discharge: 2014-01-26 | Disposition: A | Discharge status: Home / Self Care | Payer: BC Managed Care – PPO | Source: Ambulatory Visit | Attending: Family Medicine | Admitting: Family Medicine

## 2014-01-26 ENCOUNTER — Encounter (HOSPITAL_COMMUNITY): Payer: Self-pay

## 2014-01-26 DIAGNOSIS — M25619 Stiffness of unspecified shoulder, not elsewhere classified: Secondary | ICD-10-CM

## 2014-01-26 DIAGNOSIS — R29898 Other symptoms and signs involving the musculoskeletal system: Secondary | ICD-10-CM

## 2014-01-26 DIAGNOSIS — M25519 Pain in unspecified shoulder: Secondary | ICD-10-CM

## 2014-01-26 DIAGNOSIS — Z5189 Encounter for other specified aftercare: Secondary | ICD-10-CM | POA: Diagnosis not present

## 2014-01-26 NOTE — Therapy (Signed)
[facility] [address] [place], [place], [address] Phone: [phone]   Fax:  [phone]  Occupational Therapy Treatment  Patient Details  Name: Bob Maldonado MRN: [id number] Date of Birth: [birth year]  Encounter Date: 01/26/2014      OT End of Session - 01/26/14 1347    Visit Number 12   Number of Visits 24   Date for OT Re-Evaluation 01/25/14   Authorization Type BCBS   Authorization Time Period n/a   OT Start Time 1307   OT Stop Time 1349   OT Time Calculation (min) 42 min   Activity Tolerance Patient tolerated treatment well   Behavior During Therapy [facility] for tasks assessed/performed      Past Medical History  Diagnosis Date  . Diabetes mellitus without complication   . Hypertension   . Hyperlipidemia   . Gout   . Cancer     prostate / Declines treatment  . Chronic kidney disease     ESRD- Declines Dialysis  . Renal insufficiency   . Prostate cancer 03/16/11, 08/29/13    Gleason 7, volume 55 mL  . Dialysis patient   . Arthritis     Past Surgical History  Procedure Laterality Date  . Right knee  1985  . Insertion of dialysis catheter Right 05/30/2013    Procedure: INSERTION OF DIALYSIS CATHETER;  Surgeon: Conrad [place], MD;  Location: [place];  Service: Vascular;  Laterality: Right;  . Av fistula placement Left 05/30/2013    Procedure: Brachial vein transposition 1st stage;  Surgeon: Conrad [place], MD;  Location: [place];  Service: Vascular;  Laterality: Left;  . Colonoscopy    . Bascilic vein transposition Left 07/25/2013    Procedure: SECOND STAGE BRACHIAL VEIN TRANSPOSITION;  Surgeon: Conrad , MD;  Location: [place];  Service: Vascular;  Laterality: Left;  . Prostate biopsy  02/2011    Gleason 7  . Prostate biopsy  08/29/13    Gleason 7, volume 55 mL    There were no vitals taken for this visit.  Visit Diagnosis:  Pain in joint, shoulder region, unspecified laterality  Decreased range of motion of shoulder, unspecified  laterality  Shoulder weakness      Subjective Assessment - 01/26/14 1309    Symptoms "at rest, i have no pain.  I know there're there, but it feels ok today. i knew i was getting better when i can lay in bed with my hand behind my head.  i haven't beena ble to do that in months."   Currently in Pain? No/denies  with motion 6/10 today          [facility] OT Assessment - 01/26/14 0001    Observation/Other Assessments   Focus on Therapeutic Outcomes (FOTO)  FOTO 60/100          OT Treatments/Exercises (OP) - 01/26/14 1310    Shoulder Exercises: Supine   Protraction PROM;5 reps;AAROM;10 reps   Horizontal ABduction PROM;5 reps;AAROM;10 reps   External Rotation PROM;5 reps;AAROM;10 reps   Internal Rotation PROM;5 reps;AAROM;10 reps   Flexion PROM;5 reps;AAROM;10 reps   ABduction PROM;5 reps;AAROM;10 reps;Both   Shoulder Exercises: Standing   Extension Theraband;12 reps   Theraband Level (Shoulder Extension) Level 3 (Green)   Row Theraband;12 reps   Theraband Level (Shoulder Row) Level 3 (Green)   Retraction Theraband;12 reps   Theraband Level (Shoulder Retraction) Level 3 (Green)   Shoulder Exercises: ROM/Strengthening   UBE (Upper Arm Bike) 2 min forward and 2  min back at 2.0   Manual Therapy   Manual Therapy Myofascial release   Myofascial Release Myofascial release and manual stretching to bilateral UE focusing on upper arm, trapezius and scapularis region.               OT Short Term Goals - 01/26/14 1339    OT SHORT TERM GOAL #1   Title Pt will be educated on HEP   Status Achieved   OT SHORT TERM GOAL #2   Title Pt will improve BUE shoulder PROM to [facility] in working towards improved abilities donning a shirt   Status Achieved   OT SHORT TERM GOAL #3   Title Pt will decrease fascial restrictions to min-mod in BUE for overall decreased pain   Status Achieved   OT SHORT TERM GOAL #4   Title Pt will increase strength to grossly 4/5 in BUE for improved ability to hold  onto bike handlebars   Status Achieved   OT SHORT TERM GOAL #5   Title Pt's will decrease pain to less than 4/10 during daily activites   Status On-going          OT Long Term Goals - 01/26/14 1339    OT LONG TERM GOAL #1   Title Pt will acheive highest level of functioning in all ADL, IADL, work, and leisure activies.   Status On-going   OT LONG TERM GOAL #2   Title Pt will achieve BUE shoulder AROM WFL for improved ability to play a round of golf   Status On-going   OT LONG TERM GOAL #3   Title Pt will decrease fascial restrictions in BUE to minimal or less for decreased overall pain   Status On-going   OT LONG TERM GOAL #4   Title Pt will improve strength in BUE to 5/5 for improved ability to engage in overhead reaching tasks   Status On-going   OT LONG TERM GOAL #5   Title Pt will have pain in BUE of less than 2/10 when donning a coat   Status On-going          Plan - 01/26/14 1347    Clinical Impression Statement pt indicated he is feeling better overall, with decrease pian.  Was able to sleep with hands behind his head this week.  contniue PROM, MFR, and supine AAROM this session with good tolerance.  Tolerated well theraband. increased UBE resistance with good tolerance and good stretch.     Plan P: complete AAROM in standing, rather than supine         Problem List Patient Active Problem List   Diagnosis Date Noted  . Rotator cuff syndrome of both shoulders 12/25/2013  . End stage renal disease 07/21/2013  . ESRD on dialysis 06/14/2013  . Generalized weakness 06/14/2013  . Peripheral edema 05/26/2013  . Hyperkalemia 05/26/2013  . Anemia of chronic renal failure 05/26/2013  . Hyperlipidemia 07/14/2012  . Gout 07/14/2012  . Diabetes mellitus with complication 0000000  . Essential hypertension, benign 12/15/2011  . Obesity 12/15/2011  . Prostate cancer 12/15/2011    Bea Graff Ishan Sanroman, MS, OTR/L [place] [phone] 01/26/2014, 2:50 PM

## 2014-01-29 ENCOUNTER — Ambulatory Visit
Admission: RE | Admit: 2014-01-29 | Discharge: 2014-01-29 | Disposition: A | Discharge status: Home / Self Care | Payer: BLUE CROSS/BLUE SHIELD | Source: Ambulatory Visit | Attending: Radiation Oncology | Admitting: Radiation Oncology

## 2014-01-29 ENCOUNTER — Ambulatory Visit (HOSPITAL_COMMUNITY)
Admission: RE | Admit: 2014-01-29 | Discharge: 2014-01-29 | Disposition: A | Discharge status: Home / Self Care | Payer: BC Managed Care – PPO | Source: Ambulatory Visit | Attending: Family Medicine | Admitting: Family Medicine

## 2014-01-29 DIAGNOSIS — K5900 Constipation, unspecified: Secondary | ICD-10-CM | POA: Diagnosis not present

## 2014-01-29 DIAGNOSIS — R34 Anuria and oliguria: Secondary | ICD-10-CM | POA: Insufficient documentation

## 2014-01-29 DIAGNOSIS — Z5189 Encounter for other specified aftercare: Secondary | ICD-10-CM | POA: Diagnosis not present

## 2014-01-29 DIAGNOSIS — E119 Type 2 diabetes mellitus without complications: Secondary | ICD-10-CM | POA: Insufficient documentation

## 2014-01-29 DIAGNOSIS — R29898 Other symptoms and signs involving the musculoskeletal system: Secondary | ICD-10-CM

## 2014-01-29 DIAGNOSIS — M25619 Stiffness of unspecified shoulder, not elsewhere classified: Secondary | ICD-10-CM

## 2014-01-29 DIAGNOSIS — N186 End stage renal disease: Secondary | ICD-10-CM | POA: Diagnosis not present

## 2014-01-29 DIAGNOSIS — Z992 Dependence on renal dialysis: Secondary | ICD-10-CM | POA: Insufficient documentation

## 2014-01-29 DIAGNOSIS — C61 Malignant neoplasm of prostate: Secondary | ICD-10-CM | POA: Diagnosis not present

## 2014-01-29 DIAGNOSIS — Z510 Encounter for antineoplastic radiation therapy: Secondary | ICD-10-CM | POA: Diagnosis not present

## 2014-01-29 DIAGNOSIS — M25519 Pain in unspecified shoulder: Secondary | ICD-10-CM

## 2014-01-29 NOTE — Therapy (Signed)
[facility] [address] [place], [place], [address] Phone: [phone]   Fax:  [phone]  Occupational Therapy Treatment  Patient Details  Name: Bob Maldonado MRN: [id number] Date of Birth: [birth year]  Encounter Date: 01/29/2014      OT End of Session - 01/29/14 1345    Visit Number 13   Number of Visits 24   Date for OT Re-Evaluation 02/21/14   Authorization Type BCBS   Authorization Time Period n/a   OT Start Time 1303   OT Stop Time 1345   OT Time Calculation (min) 42 min   Activity Tolerance Patient tolerated treatment well   Behavior During Therapy [facility] for tasks assessed/performed      Past Medical History  Diagnosis Date  . Diabetes mellitus without complication   . Hypertension   . Hyperlipidemia   . Gout   . Cancer     prostate / Declines treatment  . Chronic kidney disease     ESRD- Declines Dialysis  . Renal insufficiency   . Prostate cancer 03/16/11, 08/29/13    Gleason 7, volume 55 mL  . Dialysis patient   . Arthritis     Past Surgical History  Procedure Laterality Date  . Right knee  1985  . Insertion of dialysis catheter Right 05/30/2013    Procedure: INSERTION OF DIALYSIS CATHETER;  Surgeon: Conrad [place], MD;  Location: [place];  Service: Vascular;  Laterality: Right;  . Av fistula placement Left 05/30/2013    Procedure: Brachial vein transposition 1st stage;  Surgeon: Conrad [place], MD;  Location: [place];  Service: Vascular;  Laterality: Left;  . Colonoscopy    . Bascilic vein transposition Left 07/25/2013    Procedure: SECOND STAGE BRACHIAL VEIN TRANSPOSITION;  Surgeon: Conrad [place], MD;  Location: [place];  Service: Vascular;  Laterality: Left;  . Prostate biopsy  02/2011    Gleason 7  . Prostate biopsy  08/29/13    Gleason 7, volume 55 mL    There were no vitals taken for this visit.  Visit Diagnosis:  Pain in joint, shoulder region, unspecified laterality  Decreased range of motion of shoulder, unspecified  laterality  Shoulder weakness      Subjective Assessment - 01/29/14 1306    Symptoms S:  Passively I have no pain, if I use my arms my pain increases to a 4 or 5   Currently in Pain? No/denies   Pain Score 0-No pain          [facility] OT Assessment - 01/29/14 1307    Precautions   Precautions None          OT Treatments/Exercises (OP) - 01/29/14 1338    Shoulder Exercises: Supine   Protraction PROM;5 reps   Horizontal ABduction PROM;5 reps   External Rotation PROM;5 reps   Internal Rotation PROM;Both   Flexion PROM   ABduction PROM;5 reps   Shoulder Exercises: Standing   Protraction AAROM;15 reps   External Rotation AAROM;10 reps   Internal Rotation AAROM;15 reps   Flexion AAROM;15 reps   ABduction AAROM;15 reps   Other Standing Exercises horizontal abduction AAROM 15 times   Shoulder Exercises: ROM/Strengthening   UBE (Upper Arm Bike) resume next visit   Manual Therapy   Manual Therapy Myofascial release   Myofascial Release Myofascial release and manual stretching to bilateral UE focusing on upper arm, trapezius and scapularis region.           OT Education - 01/29/14 1336  Education provided Yes   Education Details SHOULDER STRETCHES INCLUDING FLEXION, POSTERIOR SHOULDER STRETCH, CORNER STRETCH, AND RETRACTION   Person(s) Educated Patient   Methods Explanation;Demonstration;Handout   Comprehension Verbalized understanding          OT Short Term Goals - 01/29/14 1347    OT SHORT TERM GOAL #5   Title Pt's will decrease pain to less than 4/10 during daily activites   Status On-going          OT Long Term Goals - 01/29/14 1347    OT LONG TERM GOAL #1   Title Pt will acheive highest level of functioning in all ADL, IADL, work, and leisure activies.   Status On-going   OT LONG TERM GOAL #2   Title Pt will achieve BUE shoulder AROM WFL for improved ability to play a round of golf   Status On-going   OT LONG TERM GOAL #3   Title Pt will decrease  fascial restrictions in BUE to minimal or less for decreased overall pain   Status On-going   OT LONG TERM GOAL #4   Title Pt will improve strength in BUE to 5/5 for improved ability to engage in overhead reaching tasks   Status On-going   OT LONG TERM GOAL #5   Title Pt will have pain in BUE of less than 2/10 when donning a coat   Status On-going          Plan - 01/29/14 1346    Clinical Impression Statement A:  completed AAROM in standing.  Patient complained of increased pain with AAROM horizontal abd/add.  Educated patient on HEP for shoulder stretches to decrease tightness.   Plan P:  Follow up on HEP for stretches.  Improve AAROM to [facility].     Problem List Patient Active Problem List   Diagnosis Date Noted  . Rotator cuff syndrome of both shoulders 12/25/2013  . End stage renal disease 07/21/2013  . ESRD on dialysis 06/14/2013  . Generalized weakness 06/14/2013  . Peripheral edema 05/26/2013  . Hyperkalemia 05/26/2013  . Anemia of chronic renal failure 05/26/2013  . Hyperlipidemia 07/14/2012  . Gout 07/14/2012  . Diabetes mellitus with complication 0000000  . Essential hypertension, benign 12/15/2011  . Obesity 12/15/2011  . Prostate cancer 12/15/2011    Vangie Bicker, OTR/L [phone]  01/29/2014, 1:48 PM

## 2014-01-29 NOTE — Patient Instructions (Signed)
COMPLETE EACH STRETCH 3-5 TIMES, HOLDING FOR 5-15 SECONDS OR AS TOLERATED Flexibility: Corner Stretch   Standing in corner with hands just above shoulder level and feet ____ inches from corner, lean forward until a comfortable stretch is felt across chest. Hold ____ seconds. Repeat ____ times per set. Do ____ sets per session. Do ____ sessions per day.  http://orth.exer.us/342   Copyright  VHI. All rights reserved.   Scapular Retraction (Standing)   With arms at sides, pinch shoulder blades together. Repeat ____ times per set. Do ____ sets per session. Do ____ sessions per day.  http://orth.exer.us/944   Copyright  VHI. All rights reserved.     Phttp://orth.exer.us/888   Copyright  VHI. All rights reserved.   Posterior Capsule Stretch   Stand or sit, one arm across body so hand rests over opposite shoulder. Gently push on crossed elbow with other hand until stretch is felt in shoulder of crossed arm. Hold ___ seconds.  Repeat ___ times per session. Do ___ sessions per day.  Copyright  VHI. All rights reserved.   Flexors Stretch, Standing   Stand near wall and slide arm up, with palm facing away from wall, by leaning toward wall. Hold ___ seconds.  Repeat ___ times per session. Do ___ sessions per day.  Copyright  VHI. All rights reserved.

## 2014-01-29 NOTE — Progress Notes (Signed)
CC: Dr. Franchot Gallo  Complex simulation/treatment planning note: the patient was taken to the CT simulator. A Vac lock immobilization device was constructed. A red rubber tube was placed within the rectal vault. The patient declined catheterization provided I could see his prostate and bladder  to my advantage on his planning CT scan. His pelvis was scanned. He did have mild bladder filling.   There was good delineation of his bladder /prostate. I chose an isocenter along the central prostate. The CT data set was sent to the planning system where contoured his prostate for his CT arch study.  This was projected over the bony arch and arch was open.  His prostate volume was found to be 41 mL in the [facility] planning  system.  The CT data set was sent to the [facility]  Planning system where I contoured his prostate, bladder, rectum , and rectosigmoid colon. The prostate PTV represents the prostate +0.8 cm except for 0.5; along the rectum. The seminal vesicle PTV includes the seminal vesicles +0.5 cm. I prescribing 4500 cGy to the prostate PTV/seminal vesicles followed by an I-125 seed implant boost to his prostate.

## 2014-01-31 ENCOUNTER — Ambulatory Visit (HOSPITAL_COMMUNITY)
Admission: RE | Admit: 2014-01-31 | Discharge: 2014-01-31 | Disposition: A | Discharge status: Home / Self Care | Payer: BC Managed Care – PPO | Source: Ambulatory Visit | Attending: Family Medicine | Admitting: Family Medicine

## 2014-01-31 ENCOUNTER — Encounter (HOSPITAL_COMMUNITY): Payer: Self-pay

## 2014-01-31 DIAGNOSIS — M25519 Pain in unspecified shoulder: Secondary | ICD-10-CM

## 2014-01-31 DIAGNOSIS — M25619 Stiffness of unspecified shoulder, not elsewhere classified: Secondary | ICD-10-CM

## 2014-01-31 DIAGNOSIS — Z5189 Encounter for other specified aftercare: Secondary | ICD-10-CM | POA: Diagnosis not present

## 2014-01-31 DIAGNOSIS — R29898 Other symptoms and signs involving the musculoskeletal system: Secondary | ICD-10-CM

## 2014-01-31 NOTE — Therapy (Signed)
[facility] [address] [place], [place], [address] Phone: [phone]   Fax:  [phone]  Occupational Therapy Treatment  Patient Details  Name: Bob Maldonado MRN: [id number] Date of Birth: 12-13-1955  Encounter Date: 01/31/2014      OT End of Session - 01/31/14 1346    Visit Number 14   Number of Visits 24   Date for OT Re-Evaluation 02/21/14   Authorization Type BCBS   Authorization Time Period n/a   OT Start Time 1304   OT Stop Time 1351   OT Time Calculation (min) 47 min   Activity Tolerance Patient tolerated treatment well   Behavior During Therapy [facility] for tasks assessed/performed      Past Medical History  Diagnosis Date  . Diabetes mellitus without complication   . Hypertension   . Hyperlipidemia   . Gout   . Cancer     prostate / Declines treatment  . Chronic kidney disease     ESRD- Declines Dialysis  . Renal insufficiency   . Prostate cancer 03/16/11, 08/29/13    Gleason 7, volume 55 mL  . Dialysis patient   . Arthritis     Past Surgical History  Procedure Laterality Date  . Right knee  1985  . Insertion of dialysis catheter Right 05/30/2013    Procedure: INSERTION OF DIALYSIS CATHETER;  Surgeon: Conrad [place], MD;  Location: [place];  Service: Vascular;  Laterality: Right;  . Av fistula placement Left 05/30/2013    Procedure: Brachial vein transposition 1st stage;  Surgeon: Conrad Bogue Chitto, MD;  Location: [place];  Service: Vascular;  Laterality: Left;  . Colonoscopy    . Bascilic vein transposition Left 07/25/2013    Procedure: SECOND STAGE BRACHIAL VEIN TRANSPOSITION;  Surgeon: Conrad , MD;  Location: [place];  Service: Vascular;  Laterality: Left;  . Prostate biopsy  02/2011    Gleason 7  . Prostate biopsy  08/29/13    Gleason 7, volume 55 mL    There were no vitals taken for this visit.  Visit Diagnosis:  Pain in joint, shoulder region, unspecified laterality  Decreased range of motion of shoulder, unspecified  laterality  Shoulder weakness      Subjective Assessment - 01/31/14 1330    Symptoms S: Today I have pain. More so when I stretch across my body.    Currently in Pain? Yes   Pain Score 6    Pain Location Shoulder   Pain Orientation Right;Left   Pain Descriptors / Indicators Aching   Pain Type Acute pain          [facility] OT Assessment - 01/31/14 1333    Precautions   Precautions None          OT Treatments/Exercises (OP) - 01/31/14 1333    Shoulder Exercises: Supine   Protraction PROM;5 reps;AAROM;12 reps   Horizontal ABduction PROM;5 reps;AAROM;12 reps   External Rotation PROM;5 reps;AAROM;12 reps   Internal Rotation PROM;5 reps;AAROM;12 reps   Flexion PROM;5 reps;AAROM;12 reps   ABduction PROM;5 reps;AAROM;12 reps   Shoulder Exercises: ROM/Strengthening   UBE (Upper Arm Bike) Level 2 3' forward 3' reverse   Manual Therapy   Manual Therapy Myofascial release   Myofascial Release Positonal release completed to bilateral anterior deltoid to reduce trigger point pain and relax muscle, Muscle energy technique completed to bilateral anterior deltiod to relax tone and muscle spasm and improve range of motion.  OT Short Term Goals - 01/31/14 1337    OT SHORT TERM GOAL #1   Title Pt will be educated on HEP   OT [place] #2   Title Pt will improve BUE shoulder PROM to [facility] in working towards improved abilities donning a shirt   OT SHORT TERM GOAL #3   Title Pt will decrease fascial restrictions to min-mod in BUE for overall decreased pain   OT SHORT TERM GOAL #4   Title Pt will increase strength to grossly 4/5 in BUE for improved ability to hold onto bike handlebars   OT SHORT TERM GOAL #5   Title Pt's will decrease pain to less than 4/10 during daily activites   Status On-going          OT Long Term Goals - 01/31/14 1338    OT LONG TERM GOAL #1   Title Pt will acheive highest level of functioning in all ADL, IADL, work, and leisure activies.    Status On-going   OT LONG TERM GOAL #2   Title Pt will achieve BUE shoulder AROM WFL for improved ability to play a round of golf   Status On-going   OT LONG TERM GOAL #3   Title Pt will decrease fascial restrictions in BUE to minimal or less for decreased overall pain   Status On-going   OT LONG TERM GOAL #4   Title Pt will improve strength in BUE to 5/5 for improved ability to engage in overhead reaching tasks   Status On-going   OT LONG TERM GOAL #5   Title Pt will have pain in BUE of less than 2/10 when donning a coat   Status On-going          Plan - 01/31/14 1346    Clinical Impression Statement A: Patient had decreased joint mobility with left shoulder ranges. Joint mobility increased during muscle energy technique with right shoulder ranges. Increased patient felt with IR and ER mostly.    Plan P: Resume missed exercises.         Problem List Patient Active Problem List   Diagnosis Date Noted  . Rotator cuff syndrome of both shoulders 12/25/2013  . End stage renal disease 07/21/2013  . ESRD on dialysis 06/14/2013  . Generalized weakness 06/14/2013  . Peripheral edema 05/26/2013  . Hyperkalemia 05/26/2013  . Anemia of chronic renal failure 05/26/2013  . Hyperlipidemia 07/14/2012  . Gout 07/14/2012  . Diabetes mellitus with complication 0000000  . Essential hypertension, benign 12/15/2011  . Obesity 12/15/2011  . Prostate cancer 12/15/2011    Ailene Ravel, OTR/L,CBIS  [phone]  01/31/2014, 2:22 PM

## 2014-02-01 ENCOUNTER — Encounter: Payer: Self-pay | Admitting: Radiation Oncology

## 2014-02-01 DIAGNOSIS — Z510 Encounter for antineoplastic radiation therapy: Secondary | ICD-10-CM | POA: Diagnosis not present

## 2014-02-01 NOTE — Progress Notes (Signed)
3-D simulation note: The patient completed 3-D simulation today for treatment of his prostate cancer.  Dose volume histograms were obtained for the target structures including the prostate/seminal vesicles in addition to avoidance structures including the rectum, bladder, and femoral heads.  We met our departmental guidelines.  4 separate and unique multileaf collimators were designed to conform the field.  I prescribing 4500 cGy 25 sessions utilizing 15 MV photons.

## 2014-02-02 ENCOUNTER — Ambulatory Visit (HOSPITAL_COMMUNITY)
Admission: RE | Admit: 2014-02-02 | Discharge: 2014-02-02 | Disposition: A | Discharge status: Home / Self Care | Payer: BC Managed Care – PPO | Source: Ambulatory Visit | Attending: Family Medicine | Admitting: Family Medicine

## 2014-02-02 ENCOUNTER — Encounter (HOSPITAL_COMMUNITY): Payer: Self-pay

## 2014-02-02 DIAGNOSIS — R29898 Other symptoms and signs involving the musculoskeletal system: Secondary | ICD-10-CM

## 2014-02-02 DIAGNOSIS — Z5189 Encounter for other specified aftercare: Secondary | ICD-10-CM | POA: Diagnosis not present

## 2014-02-02 DIAGNOSIS — M25519 Pain in unspecified shoulder: Secondary | ICD-10-CM

## 2014-02-02 DIAGNOSIS — M25619 Stiffness of unspecified shoulder, not elsewhere classified: Secondary | ICD-10-CM

## 2014-02-02 NOTE — Therapy (Signed)
[place], [place], [address] Phone: [phone]   Fax:  [phone]  Occupational Therapy Treatment  Patient Details  Name: Bob Maldonado MRN: [id number] Date of Birth: 12/30/1955  Encounter Date: 02/02/2014      OT End of Session - 02/02/14 1400    Visit Number 15   Number of Visits 24   Date for OT Re-Evaluation 02/21/14   Authorization Type BCBS   Authorization Time Period n/a   OT Start Time 1305   OT Stop Time 1356   OT Time Calculation (min) 51 min   Activity Tolerance Patient tolerated treatment well   Behavior During Therapy [facility] for tasks assessed/performed      Past Medical History  Diagnosis Date  . Diabetes mellitus without complication   . Hypertension   . Hyperlipidemia   . Gout   . Cancer     prostate / Declines treatment  . Chronic kidney disease     ESRD- Declines Dialysis  . Renal insufficiency   . Prostate cancer 03/16/11, 08/29/13    Gleason 7, volume 55 mL  . Dialysis patient   . Arthritis     Past Surgical History  Procedure Laterality Date  . Right knee  1985  . Insertion of dialysis catheter Right 05/30/2013    Procedure: INSERTION OF DIALYSIS CATHETER;  Surgeon: Conrad Crown, MD;  Location: [place];  Service: Vascular;  Laterality: Right;  . Av fistula placement Left 05/30/2013    Procedure: Brachial vein transposition 1st stage;  Surgeon: Conrad Waverly, MD;  Location: [place];  Service: Vascular;  Laterality: Left;  . Colonoscopy    . Bascilic vein transposition Left 07/25/2013    Procedure: SECOND STAGE BRACHIAL VEIN TRANSPOSITION;  Surgeon: Conrad [place], MD;  Location: [place];  Service: Vascular;  Laterality: Left;  . Prostate biopsy  02/2011    Gleason 7  . Prostate biopsy  08/29/13    Gleason 7, volume 55 mL    There were no vitals taken for this visit.  Visit Diagnosis:  Pain in joint, shoulder region, unspecified laterality  Decreased range of motion of shoulder,  unspecified laterality  Shoulder weakness      Subjective Assessment - 02/02/14 1304    Currently in Pain? --  R 0/10   Pain Score 3    Pain Location Shoulder   Pain Orientation Left   Pain Descriptors / Indicators Aching   Pain Type Acute pain                 OT Treatments/Exercises (OP) - 02/02/14 1307    Shoulder Exercises: Supine   Protraction PROM;5 reps   Horizontal ABduction PROM;5 reps   External Rotation PROM;5 reps   Internal Rotation PROM;5 reps   Flexion PROM;5 reps   ABduction PROM;5 reps   Shoulder Exercises: Standing   Protraction AAROM;15 reps   External Rotation AAROM;15 reps   Internal Rotation AAROM;15 reps   Flexion AAROM;15 reps   ABduction AAROM;15 reps   Other Standing Exercises horizontal abduction AAROM 15 times   Shoulder Exercises: ROM/Strengthening   UBE (Upper Arm Bike) Level 2 3' forward 3' reverse   Manual Therapy   Manual Therapy Myofascial release   Myofascial Release Myofascial release and manual stretching to bilateral UE focusing on upper arm, trapezius and scapularis region.   Muscle energy technique completed to LUE anterior deltiod to relax tone and muscle spasm and improve range of motion.  OT Short Term Goals - 02/02/14 1345    OT SHORT TERM GOAL #5   Title Pt's will decrease pain to less than 4/10 during daily activites   Status On-going           OT Long Term Goals - 02/02/14 1358    OT LONG TERM GOAL #1   Title Pt will acheive highest level of functioning in all ADL, IADL, work, and leisure activies.   Status On-going   OT LONG TERM GOAL #2   Title Pt will achieve BUE shoulder AROM WFL for improved ability to play a round of golf   Status On-going   OT LONG TERM GOAL #3   Title Pt will decrease fascial restrictions in BUE to minimal or less for decreased overall pain   Status On-going   OT LONG TERM GOAL #4   Title Pt will improve strength in BUE to 5/5 for improved ability to  engage in overhead reaching tasks   Status On-going   OT LONG TERM GOAL #5   Title Pt will have pain in BUE of less than 2/10 when donning a coat   Status On-going               Plan - 02/02/14 1400    Clinical Impression Statement Pt with improved pain at rest this session.  Pt with continued decreased ROM in B shoulders- muscle energy technique applied with some success for improved PROM.  pt tolerated well standing AAROM.  Increased stretch verbalized with UBE.  other exercises not completed due to timing.     Plan Continue supine AAROM. continue UBe at end of session for increased stretch.        Problem List Patient Active Problem List   Diagnosis Date Noted  . Rotator cuff syndrome of both shoulders 12/25/2013  . End stage renal disease 07/21/2013  . ESRD on dialysis 06/14/2013  . Generalized weakness 06/14/2013  . Peripheral edema 05/26/2013  . Hyperkalemia 05/26/2013  . Anemia of chronic renal failure 05/26/2013  . Hyperlipidemia 07/14/2012  . Gout 07/14/2012  . Diabetes mellitus with complication 0000000  . Essential hypertension, benign 12/15/2011  . Obesity 12/15/2011  . Prostate cancer 12/15/2011     Bob Graff Ellowyn Rieves, MS, OTR/L [place] [phone] 02/02/2014, 2:11 PM  [place] [address] [place], [place], [address] Phone: [phone]   Fax:  [phone]

## 2014-02-05 ENCOUNTER — Ambulatory Visit: Payer: BLUE CROSS/BLUE SHIELD

## 2014-02-05 ENCOUNTER — Ambulatory Visit (HOSPITAL_COMMUNITY)
Admission: RE | Admit: 2014-02-05 | Discharge: 2014-02-05 | Disposition: A | Discharge status: Home / Self Care | Payer: BC Managed Care – PPO | Source: Ambulatory Visit | Attending: Family Medicine | Admitting: Family Medicine

## 2014-02-05 ENCOUNTER — Other Ambulatory Visit: Payer: Self-pay | Admitting: *Deleted

## 2014-02-05 ENCOUNTER — Encounter (HOSPITAL_COMMUNITY): Payer: Self-pay

## 2014-02-05 DIAGNOSIS — Z0000 Encounter for general adult medical examination without abnormal findings: Secondary | ICD-10-CM

## 2014-02-05 DIAGNOSIS — M25619 Stiffness of unspecified shoulder, not elsewhere classified: Secondary | ICD-10-CM

## 2014-02-05 DIAGNOSIS — Z5189 Encounter for other specified aftercare: Secondary | ICD-10-CM | POA: Diagnosis not present

## 2014-02-05 DIAGNOSIS — E119 Type 2 diabetes mellitus without complications: Secondary | ICD-10-CM

## 2014-02-05 DIAGNOSIS — M25519 Pain in unspecified shoulder: Secondary | ICD-10-CM

## 2014-02-05 DIAGNOSIS — E785 Hyperlipidemia, unspecified: Secondary | ICD-10-CM

## 2014-02-05 DIAGNOSIS — R29898 Other symptoms and signs involving the musculoskeletal system: Secondary | ICD-10-CM

## 2014-02-05 DIAGNOSIS — Z510 Encounter for antineoplastic radiation therapy: Secondary | ICD-10-CM | POA: Diagnosis not present

## 2014-02-05 NOTE — Therapy (Signed)
[place], [place], [address] Phone: [phone]   Fax:  [phone]  Occupational Therapy Treatment  Patient Details  Name: Bob Maldonado MRN: [id number] Date of Birth: 1955-09-20  Encounter Date: 02/05/2014      OT End of Session - 02/05/14 1343    Visit Number 16   Number of Visits 24   Date for OT Re-Evaluation 02/21/14   Authorization Type BCBS   Authorization Time Period n/a   OT Start Time 1303   OT Stop Time 1345   OT Time Calculation (min) 42 min   Activity Tolerance Patient tolerated treatment well   Behavior During Therapy [facility] for tasks assessed/performed      Past Medical History  Diagnosis Date  . Diabetes mellitus without complication   . Hypertension   . Hyperlipidemia   . Gout   . Cancer     prostate / Declines treatment  . Chronic kidney disease     ESRD- Declines Dialysis  . Renal insufficiency   . Prostate cancer 03/16/11, 08/29/13    Gleason 7, volume 55 mL  . Dialysis patient   . Arthritis     Past Surgical History  Procedure Laterality Date  . Right knee  1985  . Insertion of dialysis catheter Right 05/30/2013    Procedure: INSERTION OF DIALYSIS CATHETER;  Surgeon: Conrad [place], MD;  Location: [place];  Service: Vascular;  Laterality: Right;  . Av fistula placement Left 05/30/2013    Procedure: Brachial vein transposition 1st stage;  Surgeon: Conrad [place], MD;  Location: [place];  Service: Vascular;  Laterality: Left;  . Colonoscopy    . Bascilic vein transposition Left 07/25/2013    Procedure: SECOND STAGE BRACHIAL VEIN TRANSPOSITION;  Surgeon: Conrad [place], MD;  Location: [place];  Service: Vascular;  Laterality: Left;  . Prostate biopsy  02/2011    Gleason 7  . Prostate biopsy  08/29/13    Gleason 7, volume 55 mL    There were no vitals taken for this visit.  Visit Diagnosis:  Pain in joint, shoulder region, unspecified laterality  Decreased range of motion of shoulder,  unspecified laterality  Shoulder weakness      Subjective Assessment - 02/05/14 1303    Symptoms "I'm feeling the weather in there today."   Limitations Progress as tolerated   Currently in Pain? Yes   Pain Score 4    Pain Location Shoulder   Pain Orientation Left   Pain Descriptors / Indicators Aching   Pain Type Acute pain          [facility] OT Assessment - 02/05/14 1305    Precautions   Precautions None               OT Treatments/Exercises (OP) - 02/05/14 1306    Shoulder Exercises: Supine   Protraction PROM;5 reps;AAROM;15 reps   Horizontal ABduction PROM;5 reps;AAROM;15 reps   External Rotation PROM;5 reps;AAROM;15 reps   Internal Rotation PROM;5 reps;AAROM;15 reps   Flexion PROM;5 reps;AAROM;15 reps   ABduction PROM;5 reps;AAROM;15 reps   Shoulder Exercises: Standing   Extension Theraband;15 reps   Theraband Level (Shoulder Extension) Level 3 (Green)   Row [employer] reps   Theraband Level (Shoulder Row) Level 3 (Green)   Retraction Theraband;15 reps   Theraband Level (Shoulder Retraction) Level 3 (Green)   Shoulder Exercises: ROM/Strengthening   UBE (Upper Arm Bike) Level 2 3' forward 3' reverse   Manual Therapy  Manual Therapy Myofascial release   Myofascial Release Myofascial release and manual stretching to bilateral UE focusing on upper arm, trapezius and scapularis region.                OT Short Term Goals - 02/02/14 1345    OT SHORT TERM GOAL #5   Title Pt's will decrease pain to less than 4/10 during daily activites   Status On-going           OT Long Term Goals - 02/02/14 1358    OT LONG TERM GOAL #1   Title Pt will acheive highest level of functioning in all ADL, IADL, work, and leisure activies.   Status On-going   OT LONG TERM GOAL #2   Title Pt will achieve BUE shoulder AROM WFL for improved ability to play a round of golf   Status On-going   OT LONG TERM GOAL #3   Title Pt will decrease fascial restrictions in BUE to  minimal or less for decreased overall pain   Status On-going   OT LONG TERM GOAL #4   Title Pt will improve strength in BUE to 5/5 for improved ability to engage in overhead reaching tasks   Status On-going   OT LONG TERM GOAL #5   Title Pt will have pain in BUE of less than 2/10 when donning a coat   Status On-going               Plan - 02/05/14 1344    Clinical Impression Statement pt with imporve fascial restrictions on LUE this session - had had imcreased apin in RUE with PROM over LUE.  Increased supine AAROM reps to 15 with good tolerance and little fatigue.  Increased theraband resistance, and pt tolerated well, verbalizing a good stretch.     Plan P: Increase UBE resistance. Review shoulder stretches.          Problem List Patient Active Problem List   Diagnosis Date Noted  . Rotator cuff syndrome of both shoulders 12/25/2013  . End stage renal disease 07/21/2013  . ESRD on dialysis 06/14/2013  . Generalized weakness 06/14/2013  . Peripheral edema 05/26/2013  . Hyperkalemia 05/26/2013  . Anemia of chronic renal failure 05/26/2013  . Hyperlipidemia 07/14/2012  . Gout 07/14/2012  . Diabetes mellitus with complication 0000000  . Essential hypertension, benign 12/15/2011  . Obesity 12/15/2011  . Prostate cancer 12/15/2011    Bea Graff Rieley Khalsa, MS, OTR/L [place] [phone] 02/05/2014, 1:45 PM  [place] [address] [place], [place], [address] Phone: [phone]   Fax:  [phone]

## 2014-02-06 ENCOUNTER — Ambulatory Visit: Payer: BLUE CROSS/BLUE SHIELD

## 2014-02-06 ENCOUNTER — Other Ambulatory Visit: Payer: Self-pay | Admitting: Urology

## 2014-02-07 ENCOUNTER — Ambulatory Visit: Payer: BLUE CROSS/BLUE SHIELD

## 2014-02-07 ENCOUNTER — Encounter (HOSPITAL_COMMUNITY): Payer: Self-pay

## 2014-02-07 ENCOUNTER — Ambulatory Visit (HOSPITAL_COMMUNITY)
Admission: RE | Admit: 2014-02-07 | Discharge: 2014-02-07 | Disposition: A | Discharge status: Home / Self Care | Payer: BC Managed Care – PPO | Source: Ambulatory Visit | Attending: Family Medicine | Admitting: Family Medicine

## 2014-02-07 DIAGNOSIS — M25619 Stiffness of unspecified shoulder, not elsewhere classified: Secondary | ICD-10-CM

## 2014-02-07 DIAGNOSIS — R29898 Other symptoms and signs involving the musculoskeletal system: Secondary | ICD-10-CM

## 2014-02-07 DIAGNOSIS — Z5189 Encounter for other specified aftercare: Secondary | ICD-10-CM | POA: Diagnosis not present

## 2014-02-07 DIAGNOSIS — M25519 Pain in unspecified shoulder: Secondary | ICD-10-CM

## 2014-02-07 NOTE — Therapy (Signed)
[place], [place], [address] Phone: [phone]   Fax:  [phone]  Occupational Therapy Treatment  Patient Details  Name: Bob Maldonado MRN: [id number] Date of Birth: 1955/09/07  Encounter Date: 02/07/2014      OT End of Session - 02/07/14 1249    Visit Number 17   Number of Visits 24   Date for OT Re-Evaluation 02/21/14   Authorization Type BCBS   Authorization Time Period n/a   OT Start Time 1201   OT Stop Time 1252   OT Time Calculation (min) 51 min   Activity Tolerance Patient tolerated treatment well   Behavior During Therapy [facility] for tasks assessed/performed      Past Medical History  Diagnosis Date  . Diabetes mellitus without complication   . Hypertension   . Hyperlipidemia   . Gout   . Cancer     prostate / Declines treatment  . Chronic kidney disease     ESRD- Declines Dialysis  . Renal insufficiency   . Prostate cancer 03/16/11, 08/29/13    Gleason 7, volume 55 mL  . Dialysis patient   . Arthritis     Past Surgical History  Procedure Laterality Date  . Right knee  1985  . Insertion of dialysis catheter Right 05/30/2013    Procedure: INSERTION OF DIALYSIS CATHETER;  Surgeon: Conrad [place], MD;  Location: [place];  Service: Vascular;  Laterality: Right;  . Av fistula placement Left 05/30/2013    Procedure: Brachial vein transposition 1st stage;  Surgeon: Conrad [place], MD;  Location: [place];  Service: Vascular;  Laterality: Left;  . Colonoscopy    . Bascilic vein transposition Left 07/25/2013    Procedure: SECOND STAGE BRACHIAL VEIN TRANSPOSITION;  Surgeon: Conrad Quebr[place], MD;  Location: [place];  Service: Vascular;  Laterality: Left;  . Prostate biopsy  02/2011    Gleason 7  . Prostate biopsy  08/29/13    Gleason 7, volume 55 mL    There were no vitals taken for this visit.  Visit Diagnosis:  Pain in joint, shoulder region, unspecified laterality  Decreased range of motion of shoulder,  unspecified laterality  Shoulder weakness      Subjective Assessment - 02/07/14 1200    Symptoms "i've been takinga few tylonols, and that has helped to take edge off. A little stiffness at the top."   Limitations Progress as tolerated   Currently in Pain? Yes   Pain Score 5    Pain Location Shoulder   Pain Orientation Left   Pain Descriptors / Indicators Aching   Pain Type Acute pain   Pain Score 2   Pain Type Acute pain   Pain Location Shoulder   Pain Orientation Right          [facility] OT Assessment - 02/07/14 1203    Precautions   Precautions None               OT Treatments/Exercises (OP) - 02/07/14 1203    Shoulder Exercises: Supine   Protraction PROM;5 reps   Horizontal ABduction PROM;5 reps   External Rotation PROM;5 reps   Internal Rotation PROM;5 reps   Flexion PROM;5 reps   ABduction PROM;5 reps   Shoulder Exercises: Standing   Protraction AAROM;15 reps   External Rotation AAROM;15 reps   Internal Rotation AAROM;15 reps   Flexion AAROM;15 reps   ABduction AAROM;15 reps   Other Standing Exercises horizontal abduction AAROM 15 times  Shoulder Exercises: ROM/Strengthening   UBE (Upper Arm Bike) level 3, 3' forward and 3' back   Shoulder Exercises: Stretch   Corner Stretch 3 reps;20 seconds   Other Shoulder Stretches wall flexion strretch - 3 reps each side, 10 sec holds; posterior capsule stretch - 3 reps each side, 15 sec holds   Manual Therapy   Manual Therapy Myofascial release   Myofascial Release Myofascial release and manual stretching to bilateral UE focusing on upper arm, trapezius and scapularis region.                     OT Short Term Goals - 02/02/14 1345    OT SHORT TERM GOAL #5   Title Pt's will decrease pain to less than 4/10 during daily activites   Status On-going           OT Long Term Goals - 02/02/14 1358    OT LONG TERM GOAL #1   Title Pt will acheive highest level of functioning in all ADL, IADL, work, and  leisure activies.   Status On-going   OT LONG TERM GOAL #2   Title Pt will achieve BUE shoulder AROM WFL for improved ability to play a round of golf   Status On-going   OT LONG TERM GOAL #3   Title Pt will decrease fascial restrictions in BUE to minimal or less for decreased overall pain   Status On-going   OT LONG TERM GOAL #4   Title Pt will improve strength in BUE to 5/5 for improved ability to engage in overhead reaching tasks   Status On-going   OT LONG TERM GOAL #5   Title Pt will have pain in BUE of less than 2/10 when donning a coat   Status On-going               Plan - 02/07/14 1249    Clinical Impression Statement Pt with continued tightness in LUE (over RUE).  Tolerated well PROM and seated AAROM.  Reviewed shoudler stretches,a nd pt demonstrated good form.. Encouraged pt to complete at home.  Increased UBE resistance, and pt tolerated well.     Plan Add Wall wash and finger ladder.        Problem List Patient Active Problem List   Diagnosis Date Noted  . Rotator cuff syndrome of both shoulders 12/25/2013  . End stage renal disease 07/21/2013  . ESRD on dialysis 06/14/2013  . Generalized weakness 06/14/2013  . Peripheral edema 05/26/2013  . Hyperkalemia 05/26/2013  . Anemia of chronic renal failure 05/26/2013  . Hyperlipidemia 07/14/2012  . Gout 07/14/2012  . Diabetes mellitus with complication 0000000  . Essential hypertension, benign 12/15/2011  . Obesity 12/15/2011  . Prostate cancer 12/15/2011    Bea Graff Derrius Furtick, MS, OTR/L [place] [phone] 02/07/2014, 12:55 PM  [place] [address] [place], [place], [address] Phone: [phone]   Fax:  [phone]

## 2014-02-08 ENCOUNTER — Ambulatory Visit (HOSPITAL_COMMUNITY): Payer: BC Managed Care – PPO

## 2014-02-08 ENCOUNTER — Ambulatory Visit: Payer: BLUE CROSS/BLUE SHIELD

## 2014-02-12 ENCOUNTER — Ambulatory Visit: Payer: BLUE CROSS/BLUE SHIELD

## 2014-02-12 ENCOUNTER — Ambulatory Visit (HOSPITAL_COMMUNITY)
Admission: RE | Admit: 2014-02-12 | Discharge: 2014-02-12 | Disposition: A | Discharge status: Home / Self Care | Payer: BC Managed Care – PPO | Source: Ambulatory Visit | Attending: Family Medicine | Admitting: Family Medicine

## 2014-02-12 DIAGNOSIS — M25619 Stiffness of unspecified shoulder, not elsewhere classified: Secondary | ICD-10-CM

## 2014-02-12 DIAGNOSIS — M25519 Pain in unspecified shoulder: Secondary | ICD-10-CM

## 2014-02-12 DIAGNOSIS — R29898 Other symptoms and signs involving the musculoskeletal system: Secondary | ICD-10-CM

## 2014-02-12 DIAGNOSIS — Z5189 Encounter for other specified aftercare: Secondary | ICD-10-CM | POA: Diagnosis not present

## 2014-02-12 NOTE — Therapy (Signed)
[place], [place], [address] Phone: [phone]   Fax:  [phone]  Occupational Therapy Treatment  Patient Details  Name: Bob Maldonado MRN: [id number] Date of Birth: 1956-02-01  Encounter Date: 02/12/2014      OT End of Session - 02/12/14 1352    Visit Number 18   Number of Visits 24   Date for OT Re-Evaluation 02/21/14   Authorization Type BCBS   OT Start Time [id number]   OT Stop Time 1356   OT Time Calculation (min) 49 min   Activity Tolerance Patient tolerated treatment well   Behavior During Therapy [facility] for tasks assessed/performed      Past Medical History  Diagnosis Date  . Diabetes mellitus without complication   . Hypertension   . Hyperlipidemia   . Gout   . Cancer     prostate / Declines treatment  . Chronic kidney disease     ESRD- Declines Dialysis  . Renal insufficiency   . Prostate cancer 03/16/11, 08/29/13    Gleason 7, volume 55 mL  . Dialysis patient   . Arthritis     Past Surgical History  Procedure Laterality Date  . Right knee  1985  . Insertion of dialysis catheter Right 05/30/2013    Procedure: INSERTION OF DIALYSIS CATHETER;  Surgeon: Conrad Leesburg, MD;  Location: [place];  Service: Vascular;  Laterality: Right;  . Av fistula placement Left 05/30/2013    Procedure: Brachial vein transposition 1st stage;  Surgeon: Conrad Wauseon, MD;  Location: [place];  Service: Vascular;  Laterality: Left;  . Colonoscopy    . Bascilic vein transposition Left 07/25/2013    Procedure: SECOND STAGE BRACHIAL VEIN TRANSPOSITION;  Surgeon: Conrad Warwick, MD;  Location: [place];  Service: Vascular;  Laterality: Left;  . Prostate biopsy  02/2011    Gleason 7  . Prostate biopsy  08/29/13    Gleason 7, volume 55 mL    There were no vitals taken for this visit.  Visit Diagnosis:  Pain in joint, shoulder region, unspecified laterality  Decreased range of motion of shoulder, unspecified laterality  Shoulder  weakness      Subjective Assessment - 02/12/14 1342    Symptoms S:  At rest, passively, I dont have any pain.  When I move it and the weather is like this, it really hurts.   Limitations Progress as tolerated   Currently in Pain? Yes   Pain Score 4    Pain Location Shoulder   Pain Orientation Left   Pain Descriptors / Indicators Aching   Pain Type Acute pain          [facility] OT Assessment - 02/12/14 1345    Precautions   Precautions None               OT Treatments/Exercises (OP) - 02/12/14 1309    Exercises   Exercises Shoulder   Shoulder Exercises: Supine   Protraction PROM;Strengthening;10 reps;Both   Protraction Weight (lbs) 1   Horizontal ABduction PROM;Strengthening;10 reps   Horizontal ABduction Weight (lbs) 1   External Rotation PROM;Strengthening;10 reps;Both   External Rotation Weight (lbs) 1   Internal Rotation PROM;Strengthening;10 reps;Both   Internal Rotation Weight (lbs) 1   Flexion PROM;Strengthening;10 reps   Shoulder Flexion Weight (lbs) 1   ABduction PROM;5 reps;Strengthening;10 reps   Shoulder ABduction Weight (lbs) 1   Shoulder Exercises: ROM/Strengthening   UBE (Upper Arm Bike) level 3, 3' forward and  3' back   Wall Pushups 15 reps   Proximal Shoulder Strengthening, Supine 10 times wtih 13 without resting   Manual Therapy   Manual Therapy Myofascial release   Myofascial Release Myofascial release and manual stretching to bilateral UE focusing on upper arm, trapezius and scapularis region.                   OT Short Term Goals - 02/02/14 1345    OT SHORT TERM GOAL #5   Title Pt's will decrease pain to less than 4/10 during daily activites   Status On-going           OT Long Term Goals - 02/02/14 1358    OT LONG TERM GOAL #1   Title Pt will acheive highest level of functioning in all ADL, IADL, work, and leisure activies.   Status On-going   OT LONG TERM GOAL #2   Title Pt will achieve BUE shoulder AROM WFL for  improved ability to play a round of golf   Status On-going   OT LONG TERM GOAL #3   Title Pt will decrease fascial restrictions in BUE to minimal or less for decreased overall pain   Status On-going   OT LONG TERM GOAL #4   Title Pt will improve strength in BUE to 5/5 for improved ability to engage in overhead reaching tasks   Status On-going   OT LONG TERM GOAL #5   Title Pt will have pain in BUE of less than 2/10 when donning a coat   Status On-going               Plan - 02/12/14 1353    Clinical Impression Statement A:  Began strengthening in supine for increased strength within available range while continuing to improve AROM. Initiated wall pushups   Plan P:  attempt finger ladder for improved range.  Improve to 2# with supine strengthening.        Problem List Patient Active Problem List   Diagnosis Date Noted  . Rotator cuff syndrome of both shoulders 12/25/2013  . End stage renal disease 07/21/2013  . ESRD on dialysis 06/14/2013  . Generalized weakness 06/14/2013  . Peripheral edema 05/26/2013  . Hyperkalemia 05/26/2013  . Anemia of chronic renal failure 05/26/2013  . Hyperlipidemia 07/14/2012  . Gout 07/14/2012  . Diabetes mellitus with complication 0000000  . Essential hypertension, benign 12/15/2011  . Obesity 12/15/2011  . Prostate cancer 12/15/2011    Vangie Bicker, OTR/L [phone]  02/12/2014, 1:55 PM  [place] [address] [place], [place], [address] Phone: [phone]   Fax:  [phone]

## 2014-02-13 ENCOUNTER — Ambulatory Visit: Payer: BLUE CROSS/BLUE SHIELD

## 2014-02-13 ENCOUNTER — Ambulatory Visit
Admission: RE | Admit: 2014-02-13 | Discharge: 2014-02-13 | Disposition: A | Discharge status: Home / Self Care | Payer: BLUE CROSS/BLUE SHIELD | Source: Ambulatory Visit | Attending: Radiation Oncology | Admitting: Radiation Oncology

## 2014-02-13 DIAGNOSIS — Z510 Encounter for antineoplastic radiation therapy: Secondary | ICD-10-CM | POA: Diagnosis not present

## 2014-02-13 DIAGNOSIS — C61 Malignant neoplasm of prostate: Secondary | ICD-10-CM

## 2014-02-13 NOTE — Progress Notes (Signed)
Simulation verification note: The patient underwent simulation verification for treatment to his prostate.  His isocenter is in good position and the multileaf collimators contoured the treatment volume appropriately.

## 2014-02-14 ENCOUNTER — Ambulatory Visit (HOSPITAL_COMMUNITY)
Admission: RE | Admit: 2014-02-14 | Discharge: 2014-02-14 | Disposition: A | Discharge status: Home / Self Care | Payer: BC Managed Care – PPO | Source: Ambulatory Visit | Attending: Family Medicine | Admitting: Family Medicine

## 2014-02-14 ENCOUNTER — Ambulatory Visit: Payer: BLUE CROSS/BLUE SHIELD

## 2014-02-14 ENCOUNTER — Ambulatory Visit
Admission: RE | Admit: 2014-02-14 | Discharge: 2014-02-14 | Disposition: A | Discharge status: Home / Self Care | Payer: BLUE CROSS/BLUE SHIELD | Source: Ambulatory Visit | Attending: Radiation Oncology | Admitting: Radiation Oncology

## 2014-02-14 ENCOUNTER — Encounter (HOSPITAL_COMMUNITY): Payer: Self-pay

## 2014-02-14 DIAGNOSIS — Z510 Encounter for antineoplastic radiation therapy: Secondary | ICD-10-CM | POA: Diagnosis not present

## 2014-02-14 DIAGNOSIS — R29898 Other symptoms and signs involving the musculoskeletal system: Secondary | ICD-10-CM

## 2014-02-14 DIAGNOSIS — M25519 Pain in unspecified shoulder: Secondary | ICD-10-CM

## 2014-02-14 DIAGNOSIS — Z5189 Encounter for other specified aftercare: Secondary | ICD-10-CM | POA: Diagnosis not present

## 2014-02-14 DIAGNOSIS — M25619 Stiffness of unspecified shoulder, not elsewhere classified: Secondary | ICD-10-CM

## 2014-02-14 NOTE — Therapy (Signed)
[place], [place], [address] Phone: [phone]   Fax:  [phone]  Occupational Therapy Treatment  Patient Details  Name: Bob Maldonado MRN: [id number] Date of Birth: 12-Mar-1955  Encounter Date: 02/14/2014      OT End of Session - 02/14/14 1352    Visit Number 19   Number of Visits 24   Date for OT Re-Evaluation 02/21/14   Authorization Type BCBS   OT Start Time [id number]   OT Stop Time 1356   OT Time Calculation (min) 49 min   Activity Tolerance Patient tolerated treatment well   Behavior During Therapy [facility] for tasks assessed/performed      Past Medical History  Diagnosis Date  . Diabetes mellitus without complication   . Hypertension   . Hyperlipidemia   . Gout   . Cancer     prostate / Declines treatment  . Chronic kidney disease     ESRD- Declines Dialysis  . Renal insufficiency   . Prostate cancer 03/16/11, 08/29/13    Gleason 7, volume 55 mL  . Dialysis patient   . Arthritis     Past Surgical History  Procedure Laterality Date  . Right knee  1985  . Insertion of dialysis catheter Right 05/30/2013    Procedure: INSERTION OF DIALYSIS CATHETER;  Surgeon: Conrad [place], MD;  Location: [place];  Service: Vascular;  Laterality: Right;  . Av fistula placement Left 05/30/2013    Procedure: Brachial vein transposition 1st stage;  Surgeon: Conrad [place], MD;  Location: [place];  Service: Vascular;  Laterality: Left;  . Colonoscopy    . Bascilic vein transposition Left 07/25/2013    Procedure: SECOND STAGE BRACHIAL VEIN TRANSPOSITION;  Surgeon: Conrad Ezel, MD;  Location: [place];  Service: Vascular;  Laterality: Left;  . Prostate biopsy  02/2011    Gleason 7  . Prostate biopsy  08/29/13    Gleason 7, volume 55 mL    There were no vitals taken for this visit.  Visit Diagnosis:  Pain in joint, shoulder region, unspecified laterality  Decreased range of motion of shoulder, unspecified laterality  Shoulder  weakness      Subjective Assessment - 02/14/14 1328    Currently in Pain? Yes   Pain Score 3    Pain Location Shoulder   Pain Orientation Right   Pain Descriptors / Indicators Aching          [facility] OT Assessment - 02/14/14 1329    Precautions   Precautions None               OT Treatments/Exercises (OP) - 02/14/14 1329    Shoulder Exercises: Supine   Protraction PROM;Strengthening;10 reps;Both   Protraction Weight (lbs) 2   Horizontal ABduction PROM;Strengthening;10 reps;Both   Horizontal ABduction Weight (lbs) 2   External Rotation PROM;Strengthening;10 reps;Both   External Rotation Weight (lbs) 2   Internal Rotation PROM;Strengthening;10 reps;Both   Internal Rotation Weight (lbs) 2   Flexion PROM;Strengthening;10 reps   Shoulder Flexion Weight (lbs) 2   ABduction PROM;5 reps;Strengthening;10 reps   Shoulder ABduction Weight (lbs) 2   Shoulder Exercises: Standing   Protraction AROM;12 reps   External Rotation AROM;12 reps   Internal Rotation AROM;12 reps   Flexion AROM;12 reps   ABduction AROM;12 reps   Other Standing Exercises horizontal abduction AROM 12 times   Other Standing Exercises Finger ladder; using large circles walking up and down 2X; both arms   Shoulder  Exercises: ROM/Strengthening   UBE (Upper Arm Bike) level 3, 3' forward and 3' back   Wall Pushups 15 reps   Proximal Shoulder Strengthening, Supine 10X with 2#    Proximal Shoulder Strengthening, Seated 12X with no rest breaks   Manual Therapy   Manual Therapy Myofascial release                  OT Short Term Goals - 02/02/14 1345    OT SHORT TERM GOAL #5   Title Pt's will decrease pain to less than 4/10 during daily activites   Status On-going           OT Long Term Goals - 02/02/14 1358    OT LONG TERM GOAL #1   Title Pt will acheive highest level of functioning in all ADL, IADL, work, and leisure activies.   Status On-going   OT LONG TERM GOAL #2   Title Pt will  achieve BUE shoulder AROM WFL for improved ability to play a round of golf   Status On-going   OT LONG TERM GOAL #3   Title Pt will decrease fascial restrictions in BUE to minimal or less for decreased overall pain   Status On-going   OT LONG TERM GOAL #4   Title Pt will improve strength in BUE to 5/5 for improved ability to engage in overhead reaching tasks   Status On-going   OT LONG TERM GOAL #5   Title Pt will have pain in BUE of less than 2/10 when donning a coat   Status On-going               Plan - 02/14/14 1403    Clinical Impression Statement A: Added 2# hand weight to supine strengthening exercises, progressed to AROM standing, and added finger ladder. Pt tolerated well.    Plan P: Add ball on the wall and wall wash.        Problem List Patient Active Problem List   Diagnosis Date Noted  . Rotator cuff syndrome of both shoulders 12/25/2013  . End stage renal disease 07/21/2013  . ESRD on dialysis 06/14/2013  . Generalized weakness 06/14/2013  . Peripheral edema 05/26/2013  . Hyperkalemia 05/26/2013  . Anemia of chronic renal failure 05/26/2013  . Hyperlipidemia 07/14/2012  . Gout 07/14/2012  . Diabetes mellitus with complication 0000000  . Essential hypertension, benign 12/15/2011  . Obesity 12/15/2011  . Prostate cancer 12/15/2011    Ailene Ravel, OTR/L,CBIS  [phone]  02/14/2014, 2:05 PM  [place] [address] [place], [place], [address] Phone: [phone]   Fax:  [phone]

## 2014-02-15 ENCOUNTER — Ambulatory Visit: Payer: BLUE CROSS/BLUE SHIELD

## 2014-02-15 ENCOUNTER — Ambulatory Visit (HOSPITAL_COMMUNITY)
Admission: RE | Admit: 2014-02-15 | Discharge: 2014-02-15 | Disposition: A | Discharge status: Home / Self Care | Payer: BC Managed Care – PPO | Source: Ambulatory Visit | Attending: Family Medicine | Admitting: Family Medicine

## 2014-02-15 ENCOUNTER — Ambulatory Visit
Admission: RE | Admit: 2014-02-15 | Discharge: 2014-02-15 | Disposition: A | Discharge status: Home / Self Care | Payer: BLUE CROSS/BLUE SHIELD | Source: Ambulatory Visit | Attending: Radiation Oncology | Admitting: Radiation Oncology

## 2014-02-15 DIAGNOSIS — M25519 Pain in unspecified shoulder: Secondary | ICD-10-CM

## 2014-02-15 DIAGNOSIS — Z510 Encounter for antineoplastic radiation therapy: Secondary | ICD-10-CM | POA: Diagnosis not present

## 2014-02-15 DIAGNOSIS — M25619 Stiffness of unspecified shoulder, not elsewhere classified: Secondary | ICD-10-CM

## 2014-02-15 DIAGNOSIS — Z5189 Encounter for other specified aftercare: Secondary | ICD-10-CM | POA: Diagnosis not present

## 2014-02-15 DIAGNOSIS — R29898 Other symptoms and signs involving the musculoskeletal system: Secondary | ICD-10-CM

## 2014-02-15 NOTE — Therapy (Signed)
[place], [place], [address] Phone: [phone]   Fax:  [phone]  Occupational Therapy Treatment  Patient Details  Name: Bob Maldonado MRN: [id number] Date of Birth: 1955/06/17  Encounter Date: 02/15/2014      OT End of Session - 02/15/14 1230    Visit Number 20   Number of Visits 24   Date for OT Re-Evaluation 02/21/14   Authorization Type BCBS   OT Start Time 1148   OT Stop Time 1240   OT Time Calculation (min) 52 min   Activity Tolerance Patient tolerated treatment well   Behavior During Therapy [facility] for tasks assessed/performed      Past Medical History  Diagnosis Date  . Diabetes mellitus without complication   . Hypertension   . Hyperlipidemia   . Gout   . Cancer     prostate / Declines treatment  . Chronic kidney disease     ESRD- Declines Dialysis  . Renal insufficiency   . Prostate cancer 03/16/11, 08/29/13    Gleason 7, volume 55 mL  . Dialysis patient   . Arthritis     Past Surgical History  Procedure Laterality Date  . Right knee  1985  . Insertion of dialysis catheter Right 05/30/2013    Procedure: INSERTION OF DIALYSIS CATHETER;  Surgeon: Conrad Levant, MD;  Location: [place];  Service: Vascular;  Laterality: Right;  . Av fistula placement Left 05/30/2013    Procedure: Brachial vein transposition 1st stage;  Surgeon: Conrad [place], MD;  Location: [place];  Service: Vascular;  Laterality: Left;  . Colonoscopy    . Bascilic vein transposition Left 07/25/2013    Procedure: SECOND STAGE BRACHIAL VEIN TRANSPOSITION;  Surgeon: Conrad Rose, MD;  Location: [place];  Service: Vascular;  Laterality: Left;  . Prostate biopsy  02/2011    Gleason 7  . Prostate biopsy  08/29/13    Gleason 7, volume 55 mL    There were no vitals taken for this visit.  Visit Diagnosis:  Pain in joint, shoulder region, unspecified laterality  Decreased range of motion of shoulder, unspecified laterality  Shoulder  weakness      Subjective Assessment - 02/15/14 1220    Symptoms S:  I think that I got a really good work out yesterday.   Currently in Pain? Yes   Pain Score 2    Pain Location Shoulder   Pain Orientation Left   Pain Descriptors / Indicators Aching   Pain Type Chronic pain          [facility] OT Assessment - 02/14/14 1329    Precautions   Precautions None               OT Treatments/Exercises (OP) - 02/15/14 1221    Exercises   Exercises Shoulder   Shoulder Exercises: Supine   Protraction PROM;10 reps;Strengthening;15 reps   Protraction Weight (lbs) 3   Horizontal ABduction PROM;10 reps;Strengthening;15 reps   Horizontal ABduction Weight (lbs) 3   External Rotation PROM;10 reps;Strengthening;15 reps   External Rotation Weight (lbs) 3   Internal Rotation PROM;10 reps;Strengthening;15 reps   Internal Rotation Weight (lbs) 3   Flexion PROM;10 reps;Strengthening;15 reps   Shoulder Flexion Weight (lbs) 3   ABduction PROM;10 reps;Strengthening;15 reps   Shoulder ABduction Weight (lbs) 3   Shoulder Exercises: Standing   Protraction AROM;12 reps   External Rotation AROM;12 reps   Internal Rotation AROM;12 reps   Flexion AROM;12 reps  Other Standing Exercises horizontal abduction AROM 10 times, external rotation with shoulder adducted then abduct arms to 90   Shoulder Exercises: ROM/Strengthening   UBE (Upper Arm Bike) level 4 3' forward and 3' backward    Wall Pushups 15 reps   Proximal Shoulder Strengthening, Supine 10X with 3#   Ball on Wall 1 minute with arm flexed to 90    Manual Therapy   Manual Therapy Myofascial release   Myofascial Release Myofascial release and manual stretching to bilateral UE focusing on upper arm, trapezius and scapularis region                  OT Short Term Goals - 02/02/14 1345    OT SHORT TERM GOAL #5   Title Pt's will decrease pain to less than 4/10 during daily activites   Status On-going           OT Long Term  Goals - 02/02/14 1358    OT LONG TERM GOAL #1   Title Pt will acheive highest level of functioning in all ADL, IADL, work, and leisure activies.   Status On-going   OT LONG TERM GOAL #2   Title Pt will achieve BUE shoulder AROM WFL for improved ability to play a round of golf   Status On-going   OT LONG TERM GOAL #3   Title Pt will decrease fascial restrictions in BUE to minimal or less for decreased overall pain   Status On-going   OT LONG TERM GOAL #4   Title Pt will improve strength in BUE to 5/5 for improved ability to engage in overhead reaching tasks   Status On-going   OT LONG TERM GOAL #5   Title Pt will have pain in BUE of less than 2/10 when donning a coat   Status On-going               Plan - 02/15/14 1223    Clinical Impression Statement A:  Patient increaed to 3# with strengthening exercisees.  added wall wash and ball on wall in flexion and abduciton.  Improved AROM and PROM in left shoulder this date.    Plan P:  Reassess for MD appointment on 1/7        Problem List Patient Active Problem List   Diagnosis Date Noted  . Rotator cuff syndrome of both shoulders 12/25/2013  . End stage renal disease 07/21/2013  . ESRD on dialysis 06/14/2013  . Generalized weakness 06/14/2013  . Peripheral edema 05/26/2013  . Hyperkalemia 05/26/2013  . Anemia of chronic renal failure 05/26/2013  . Hyperlipidemia 07/14/2012  . Gout 07/14/2012  . Diabetes mellitus with complication 0000000  . Essential hypertension, benign 12/15/2011  . Obesity 12/15/2011  . Prostate cancer 12/15/2011    Vangie Bicker, OTR/L [phone]  02/15/2014, 12:39 PM  [place] [address] [place], [place], [address] Phone: [phone]   Fax:  [phone]

## 2014-02-19 ENCOUNTER — Ambulatory Visit (HOSPITAL_COMMUNITY)
Admission: RE | Admit: 2014-02-19 | Discharge: 2014-02-19 | Disposition: A | Discharge status: Home / Self Care | Payer: BLUE CROSS/BLUE SHIELD | Source: Ambulatory Visit | Attending: Family Medicine | Admitting: Family Medicine

## 2014-02-19 ENCOUNTER — Encounter (HOSPITAL_COMMUNITY): Payer: Self-pay

## 2014-02-19 ENCOUNTER — Ambulatory Visit
Admission: RE | Admit: 2014-02-19 | Discharge: 2014-02-19 | Disposition: A | Discharge status: Home / Self Care | Payer: BLUE CROSS/BLUE SHIELD | Source: Ambulatory Visit | Attending: Radiation Oncology | Admitting: Radiation Oncology

## 2014-02-19 ENCOUNTER — Encounter: Payer: Self-pay | Admitting: Radiation Oncology

## 2014-02-19 ENCOUNTER — Ambulatory Visit: Payer: BLUE CROSS/BLUE SHIELD

## 2014-02-19 VITALS — BP 107/57 | HR 57 | Temp 97.7°F | Resp 12 | Wt 221.8 lb

## 2014-02-19 DIAGNOSIS — M6281 Muscle weakness (generalized): Secondary | ICD-10-CM | POA: Insufficient documentation

## 2014-02-19 DIAGNOSIS — M25611 Stiffness of right shoulder, not elsewhere classified: Secondary | ICD-10-CM | POA: Diagnosis not present

## 2014-02-19 DIAGNOSIS — Z5189 Encounter for other specified aftercare: Secondary | ICD-10-CM | POA: Insufficient documentation

## 2014-02-19 DIAGNOSIS — M25512 Pain in left shoulder: Secondary | ICD-10-CM | POA: Insufficient documentation

## 2014-02-19 DIAGNOSIS — Z510 Encounter for antineoplastic radiation therapy: Secondary | ICD-10-CM | POA: Diagnosis not present

## 2014-02-19 DIAGNOSIS — M25511 Pain in right shoulder: Secondary | ICD-10-CM | POA: Diagnosis not present

## 2014-02-19 DIAGNOSIS — M25612 Stiffness of left shoulder, not elsewhere classified: Secondary | ICD-10-CM | POA: Diagnosis not present

## 2014-02-19 DIAGNOSIS — M25519 Pain in unspecified shoulder: Secondary | ICD-10-CM

## 2014-02-19 DIAGNOSIS — C61 Malignant neoplasm of prostate: Secondary | ICD-10-CM

## 2014-02-19 DIAGNOSIS — R29898 Other symptoms and signs involving the musculoskeletal system: Secondary | ICD-10-CM

## 2014-02-19 DIAGNOSIS — M25619 Stiffness of unspecified shoulder, not elsewhere classified: Secondary | ICD-10-CM

## 2014-02-19 NOTE — Therapy (Signed)
[place], [place], [address] Phone: [phone]   Fax:  [phone]  Occupational Therapy Treatment  Patient Details  Name: Bob Maldonado MRN: [id number] Date of Birth: 01-Jul-1955  Encounter Date: 02/19/2014      OT End of Session - 02/19/14 1455    Visit Number 21   Number of Visits 24   Date for OT Re-Evaluation 02/21/14   Authorization Type BCBS   Authorization Time Period n/a   OT Start Time 1303   OT Stop Time 1350   OT Time Calculation (min) 47 min   Activity Tolerance Patient tolerated treatment well   Behavior During Therapy [facility] for tasks assessed/performed      Past Medical History  Diagnosis Date  . Diabetes mellitus without complication   . Hypertension   . Hyperlipidemia   . Gout   . Cancer     prostate / Declines treatment  . Chronic kidney disease     ESRD- Declines Dialysis  . Renal insufficiency   . Prostate cancer 03/16/11, 08/29/13    Gleason 7, volume 55 mL  . Dialysis patient   . Arthritis     Past Surgical History  Procedure Laterality Date  . Right knee  1985  . Insertion of dialysis catheter Right 05/30/2013    Procedure: INSERTION OF DIALYSIS CATHETER;  Surgeon: Conrad Borden, MD;  Location: [place];  Service: Vascular;  Laterality: Right;  . Av fistula placement Left 05/30/2013    Procedure: Brachial vein transposition 1st stage;  Surgeon: Conrad [place], MD;  Location: [place];  Service: Vascular;  Laterality: Left;  . Colonoscopy    . Bascilic vein transposition Left 07/25/2013    Procedure: SECOND STAGE BRACHIAL VEIN TRANSPOSITION;  Surgeon: Conrad Proberta, MD;  Location: [place];  Service: Vascular;  Laterality: Left;  . Prostate biopsy  02/2011    Gleason 7  . Prostate biopsy  08/29/13    Gleason 7, volume 55 mL    There were no vitals taken for this visit.  Visit Diagnosis:  Pain in joint, shoulder region, unspecified laterality  Decreased range of motion of shoulder,  unspecified laterality  Shoulder weakness      Subjective Assessment - 02/19/14 1304    Symptoms S: my right shoulder had gotten significantly better   Limitations Progress as tolerated   Currently in Pain? Yes   Pain Score 5    Pain Location Shoulder   Pain Orientation Left   Pain Type Chronic pain   Pain Score 3   Pain Type Acute pain   Pain Location Shoulder   Pain Orientation Right          [facility] OT Assessment - 02/19/14 1306    Precautions   Precautions None   Prior Function   Level of Independence Independent with basic ADLs;Independent with homemaking with ambulation   ADL   ADL comments Pt reports improvemetns in donnign all shirts and coats, but has increased difficulty with very heavy clothing (winter coat).   Observation/Other Assessments   Focus on Therapeutic Outcomes (FOTO)  FOTO 61/100  Previous 60/100   AROM   Overall AROM Comments Assessed in standing, with ER/IR adducted (Previous measurements 12/9)   Right Shoulder Flexion 132 Degrees  132   Right Shoulder ABduction 136 Degrees  129   Right Shoulder Internal Rotation 80 Degrees  95   Right Shoulder External Rotation 73 Degrees  53   Left Shoulder Flexion  96 Degrees  103   Left Shoulder ABduction 104 Degrees  97   Left Shoulder Internal Rotation 75 Degrees  86   Left Shoulder External Rotation 61 Degrees  45   PROM   Overall PROM Comments Assessed in supine, with ER/IR adducted (initial measurements 12/9   Right Shoulder Flexion 150 Degrees  138   Right Shoulder ABduction 156 Degrees  129   Right Shoulder Internal Rotation 93 Degrees  89   Right Shoulder External Rotation 61 Degrees  59   Left Shoulder Flexion 111 Degrees  106   Left Shoulder ABduction 116 Degrees  110   Left Shoulder Internal Rotation 90 Degrees  95   Left Shoulder External Rotation 51 Degrees  56   Strength   Right Shoulder Flexion 5/5   Right Shoulder ABduction 5/5   Right Shoulder Internal Rotation 5/5    Right Shoulder External Rotation 5/5   Left Shoulder Flexion 4+/5   Left Shoulder ABduction 4+/5   Left Shoulder Internal Rotation --  4+/5 (pervious 4+/5)   Left Shoulder External Rotation 4+/5               OT Treatments/Exercises (OP) - 02/19/14 1305    Manual Therapy   Manual Therapy Myofascial release   Myofascial Release Myofascial release and manual stretching to bilateral UE focusing on upper arm, trapezius and scapularis region.                    OT Short Term Goals - 02/19/14 1453    OT SHORT TERM GOAL #1   Title Pt will be educated on HEP   Status Achieved   OT SHORT TERM GOAL #2   Title Pt will improve BUE shoulder PROM to [facility] in working towards improved abilities donning a shirt   Status Achieved   OT SHORT TERM GOAL #3   Title Pt will decrease fascial restrictions to min-mod in BUE for overall decreased pain   Status Achieved   OT SHORT TERM GOAL #4   Title Pt will increase strength to grossly 4/5 in BUE for improved ability to hold onto bike handlebars   Status Achieved   OT SHORT TERM GOAL #5   Title Pt's will decrease pain to less than 4/10 during daily activites   Status Partially Met           OT Long Term Goals - 02/19/14 1454    OT LONG TERM GOAL #1   Title Pt will acheive highest level of functioning in all ADL, IADL, work, and leisure activies.   Status On-going   OT LONG TERM GOAL #2   Title Pt will achieve BUE shoulder AROM WFL for improved ability to play a round of golf   Status On-going   OT LONG TERM GOAL #3   Title Pt will decrease fascial restrictions in BUE to minimal or less for decreased overall pain   Status On-going   OT LONG TERM GOAL #4   Title Pt will improve strength in BUE to 5/5 for improved ability to engage in overhead reaching tasks   Status On-going   OT LONG TERM GOAL #5   Title Pt will have pain in BUE of less than 2/10 when donning a coat   Status On-going               Plan - 02/19/14  1455    Clinical Impression Statement Re-evaluation completed this session. Pt has progressed well in therapy, but demonstrated  increased difficulty with evaluation measurements this session. He continues to demonstrate improvemetn sin some areas, and will continue to benefit from skilled OT services.  he has met 4/5 STG, partially met 1/5 STG, and is progressing towared his LTG.  Pt continued to have incrased pain in L shoulder and increased discomfort in R shoulder- provided BioFreeze sample for pt to try.     Rehab Potential Good   OT Frequency 3x / week   OT Duration 4 weeks   OT Treatment/Interventions Self-care/ADL training;Cryotherapy;Electrical Stimulation;Moist Heat;Ultrasound;Therapeutic exercise;Passive range of motion;Manual Therapy;Therapeutic exercises;Therapeutic activities;Patient/family education   Plan contniue OT services for an additional 4 weeks.  Resume therapeutic exercises next session.   Consulted and Agree with Plan of Care Patient        Problem List Patient Active Problem List   Diagnosis Date Noted  . Rotator cuff syndrome of both shoulders 12/25/2013  . End stage renal disease 07/21/2013  . ESRD on dialysis 06/14/2013  . Generalized weakness 06/14/2013  . Peripheral edema 05/26/2013  . Hyperkalemia 05/26/2013  . Anemia of chronic renal failure 05/26/2013  . Hyperlipidemia 07/14/2012  . Gout 07/14/2012  . Diabetes mellitus with complication 80/22/3361  . Essential hypertension, benign 12/15/2011  . Obesity 12/15/2011  . Prostate cancer 12/15/2011    Bob Graff Kaysia Willard, MS, OTR/L [place] [phone] 02/19/2014, 3:05 PM  [place] [address] [place] [address] [place], [place], [address] Phone: [phone]   Fax:  [phone]

## 2014-02-19 NOTE — Progress Notes (Signed)
Pt here for patient teaching.  Radiation and You booklet given with areas of pertinence flagged and highlighted.  Reviewed diarrhea, fatigue, skin care/changes, hair loss to treatment area, urinary changes, sexual changes. Pt able to give teach back of patting skin after a bowel movements, use of baby wipes and spray bottle. Suggested having Imodium on hand for diarrhea and that nursing would provide a sitz bath if needed. Pt demonstrated understanding of information given and will contact nursing with any questions or concerns.

## 2014-02-19 NOTE — Progress Notes (Signed)
He is currently in no pain. Pt complains of Loss of Sleep, Fatigue and Generalized Weakness.  Reports urinary hesitancy, urinary retention, sweats and nausea (reports nausea is from lunch), "stinging" sensation upon starting urinary stream.  Pt states they urinate 0 - 1 times per night.  Pt reports Constipation, a bowel movement 2 times a week.  Reports he occasionally takes Miralax.  Reports he can only have 40 oz of fluid of intake a day due to past kidney failure, receives dialysis 3 times a week.

## 2014-02-19 NOTE — Progress Notes (Signed)
Weekly Management Note:  Site: Prostate Current Dose:  720  cGy Projected Dose: 4500  cGy followed by prostate seed implant boost  Narrative: The patient is seen today for routine under treatment assessment. CBCT/MVCT images/port films were reviewed. The chart was reviewed.   Bladder filling is less than ideal.  He is on dialysis and is difficult for him to fill his bladder.  No GI difficulties except for chronic constipation.  He takes MiraLAX only twice a week, and he is on water restriction.  Physical Examination:  Filed Vitals:   02/19/14 1646  BP: 107/57  Pulse: 57  Temp: 97.7 F (36.5 C)  Resp: 12  .  Weight: 221 lb 12.8 oz (100.608 kg).  No change.  Impression: Tolerating radiation therapy well.  Plan: Continue radiation therapy as planned.

## 2014-02-20 ENCOUNTER — Ambulatory Visit (INDEPENDENT_AMBULATORY_CARE_PROVIDER_SITE_OTHER): Payer: BLUE CROSS/BLUE SHIELD | Admitting: Urology

## 2014-02-20 ENCOUNTER — Ambulatory Visit: Payer: BLUE CROSS/BLUE SHIELD

## 2014-02-20 ENCOUNTER — Ambulatory Visit
Admission: RE | Admit: 2014-02-20 | Discharge: 2014-02-20 | Disposition: A | Discharge status: Home / Self Care | Payer: BLUE CROSS/BLUE SHIELD | Source: Ambulatory Visit | Attending: Radiation Oncology | Admitting: Radiation Oncology

## 2014-02-20 DIAGNOSIS — Z510 Encounter for antineoplastic radiation therapy: Secondary | ICD-10-CM | POA: Diagnosis not present

## 2014-02-20 DIAGNOSIS — Z992 Dependence on renal dialysis: Secondary | ICD-10-CM | POA: Insufficient documentation

## 2014-02-20 DIAGNOSIS — C61 Malignant neoplasm of prostate: Secondary | ICD-10-CM

## 2014-02-21 ENCOUNTER — Ambulatory Visit (HOSPITAL_COMMUNITY)
Admission: RE | Admit: 2014-02-21 | Discharge: 2014-02-21 | Disposition: A | Discharge status: Home / Self Care | Payer: BLUE CROSS/BLUE SHIELD | Source: Ambulatory Visit | Attending: Family Medicine | Admitting: Family Medicine

## 2014-02-21 ENCOUNTER — Encounter (HOSPITAL_COMMUNITY): Payer: Self-pay

## 2014-02-21 ENCOUNTER — Ambulatory Visit (HOSPITAL_COMMUNITY): Payer: BLUE CROSS/BLUE SHIELD

## 2014-02-21 ENCOUNTER — Ambulatory Visit
Admission: RE | Admit: 2014-02-21 | Discharge: 2014-02-21 | Disposition: A | Discharge status: Home / Self Care | Payer: BLUE CROSS/BLUE SHIELD | Source: Ambulatory Visit | Attending: Radiation Oncology | Admitting: Radiation Oncology

## 2014-02-21 ENCOUNTER — Ambulatory Visit: Payer: BLUE CROSS/BLUE SHIELD

## 2014-02-21 DIAGNOSIS — M25519 Pain in unspecified shoulder: Secondary | ICD-10-CM

## 2014-02-21 DIAGNOSIS — Z510 Encounter for antineoplastic radiation therapy: Secondary | ICD-10-CM | POA: Diagnosis not present

## 2014-02-21 DIAGNOSIS — M25619 Stiffness of unspecified shoulder, not elsewhere classified: Secondary | ICD-10-CM

## 2014-02-21 DIAGNOSIS — R29898 Other symptoms and signs involving the musculoskeletal system: Secondary | ICD-10-CM

## 2014-02-21 DIAGNOSIS — Z5189 Encounter for other specified aftercare: Secondary | ICD-10-CM | POA: Diagnosis not present

## 2014-02-21 NOTE — Therapy (Signed)
[place], [place], [address] Phone: [phone]   Fax:  [phone]  Occupational Therapy Treatment  Patient Details  Name: Bob Maldonado MRN: [id number] Date of Birth: 03-01-1955 Referring Provider:  Alycia Rossetti, MD  Encounter Date: 02/21/2014      OT End of Session - 02/21/14 1614    Visit Number 22   Number of Visits 33   Date for OT Re-Evaluation 02/21/14   Authorization Type BCBS   Authorization Time Period n/a   OT Start Time 1435   OT Stop Time 1516   OT Time Calculation (min) 41 min   Activity Tolerance Patient tolerated treatment well   Behavior During Therapy [facility] for tasks assessed/performed      Past Medical History  Diagnosis Date  . Diabetes mellitus without complication   . Hypertension   . Hyperlipidemia   . Gout   . Cancer     prostate / Declines treatment  . Chronic kidney disease     ESRD- Declines Dialysis  . Renal insufficiency   . Prostate cancer 03/16/11, 08/29/13    Gleason 7, volume 55 mL  . Dialysis patient   . Arthritis     Past Surgical History  Procedure Laterality Date  . Right knee  1985  . Insertion of dialysis catheter Right 05/30/2013    Procedure: INSERTION OF DIALYSIS CATHETER;  Surgeon: Conrad Neosho, MD;  Location: [place];  Service: Vascular;  Laterality: Right;  . Av fistula placement Left 05/30/2013    Procedure: Brachial vein transposition 1st stage;  Surgeon: Conrad Inchelium, MD;  Location: [place];  Service: Vascular;  Laterality: Left;  . Colonoscopy    . Bascilic vein transposition Left 07/25/2013    Procedure: SECOND STAGE BRACHIAL VEIN TRANSPOSITION;  Surgeon: Conrad Maywood, MD;  Location: [place];  Service: Vascular;  Laterality: Left;  . Prostate biopsy  02/2011    Gleason 7  . Prostate biopsy  08/29/13    Gleason 7, volume 55 mL    There were no vitals taken for this visit.  Visit Diagnosis:  Pain in joint, shoulder region, unspecified  laterality  Decreased range of motion of shoulder, unspecified laterality  Shoulder weakness      Subjective Assessment - 02/21/14 1438    Symptoms "its really just that coat that give me trouble now."   Currently in Pain? No/denies  with no motion          [facility] OT Assessment - 02/21/14 0001    Precautions   Precautions None               OT Treatments/Exercises (OP) - 02/21/14 1439    Shoulder Exercises: Supine   Protraction PROM;5 reps;Strengthening;15 reps   Protraction Weight (lbs) 3   Horizontal ABduction PROM;5 reps;Strengthening;15 reps   Horizontal ABduction Weight (lbs) 3   External Rotation PROM;5 reps;Strengthening;15 reps   External Rotation Weight (lbs) 3   Internal Rotation PROM;5 reps;Strengthening;15 reps   Internal Rotation Weight (lbs) 3   Flexion PROM;5 reps;Strengthening;15 reps   Shoulder Flexion Weight (lbs) 3   ABduction PROM;5 reps;Strengthening;15 reps   Shoulder ABduction Weight (lbs) 3   Shoulder Exercises: ROM/Strengthening   Cybex Press 3 plate;15 reps   Cybex Row 3 plate;15 reps   Ball on Wall 1 minute in flexion, bilaterally   Manual Therapy   Manual Therapy Myofascial release   Myofascial Release Myofascial release and manual stretching to bilateral  UE focusing on upper arm, trapezius and scapularis region.                     OT Short Term Goals - 02/19/14 1453    OT SHORT TERM GOAL #1   Title Pt will be educated on HEP   Status Achieved   OT SHORT TERM GOAL #2   Title Pt will improve BUE shoulder PROM to [facility] in working towards improved abilities donning a shirt   Status Achieved   OT SHORT TERM GOAL #3   Title Pt will decrease fascial restrictions to min-mod in BUE for overall decreased pain   Status Achieved   OT SHORT TERM GOAL #4   Title Pt will increase strength to grossly 4/5 in BUE for improved ability to hold onto bike handlebars   Status Achieved   OT SHORT TERM GOAL #5   Title Pt's will decrease  pain to less than 4/10 during daily activites   Status Partially Met           OT Long Term Goals - 02/19/14 1454    OT LONG TERM GOAL #1   Title Pt will acheive highest level of functioning in all ADL, IADL, work, and leisure activies.   Status On-going   OT LONG TERM GOAL #2   Title Pt will achieve BUE shoulder AROM WFL for improved ability to play a round of golf   Status On-going   OT LONG TERM GOAL #3   Title Pt will decrease fascial restrictions in BUE to minimal or less for decreased overall pain   Status On-going   OT LONG TERM GOAL #4   Title Pt will improve strength in BUE to 5/5 for improved ability to engage in overhead reaching tasks   Status On-going   OT LONG TERM GOAL #5   Title Pt will have pain in BUE of less than 2/10 when donning a coat   Status On-going               Plan - 02/21/14 1615    Clinical Impression Statement Continued supine 3# this session and bilateral ballon wal in flexion, with good tolerance.  Added cybex press and row this session - pt tolerated well and verbalized a good stretch.  pt verbalized feeling well at end of session, with some fatigue.     Plan Continue standing exercises.        Problem List Patient Active Problem List   Diagnosis Date Noted  . Rotator cuff syndrome of both shoulders 12/25/2013  . End stage renal disease 07/21/2013  . ESRD on dialysis 06/14/2013  . Generalized weakness 06/14/2013  . Peripheral edema 05/26/2013  . Hyperkalemia 05/26/2013  . Anemia of chronic renal failure 05/26/2013  . Hyperlipidemia 07/14/2012  . Gout 07/14/2012  . Diabetes mellitus with complication 10/62/6948  . Essential hypertension, benign 12/15/2011  . Obesity 12/15/2011  . Prostate cancer 12/15/2011    Bea Graff Gracen Southwell, MS, OTR/L [place] [phone] 02/21/2014, 4:16 PM  [place] [address] [place], [place], [address] Phone:  [phone]   Fax:  [phone]

## 2014-02-22 ENCOUNTER — Ambulatory Visit
Admission: RE | Admit: 2014-02-22 | Discharge: 2014-02-22 | Disposition: A | Discharge status: Home / Self Care | Payer: BLUE CROSS/BLUE SHIELD | Source: Ambulatory Visit | Attending: Radiation Oncology | Admitting: Radiation Oncology

## 2014-02-22 ENCOUNTER — Encounter: Payer: Self-pay | Admitting: Orthopedic Surgery

## 2014-02-22 ENCOUNTER — Ambulatory Visit: Payer: BLUE CROSS/BLUE SHIELD

## 2014-02-22 ENCOUNTER — Ambulatory Visit (INDEPENDENT_AMBULATORY_CARE_PROVIDER_SITE_OTHER): Payer: BLUE CROSS/BLUE SHIELD | Admitting: Orthopedic Surgery

## 2014-02-22 DIAGNOSIS — M75102 Unspecified rotator cuff tear or rupture of left shoulder, not specified as traumatic: Secondary | ICD-10-CM

## 2014-02-22 DIAGNOSIS — M75101 Unspecified rotator cuff tear or rupture of right shoulder, not specified as traumatic: Secondary | ICD-10-CM

## 2014-02-22 DIAGNOSIS — Z510 Encounter for antineoplastic radiation therapy: Secondary | ICD-10-CM | POA: Diagnosis not present

## 2014-02-22 NOTE — Patient Instructions (Signed)
Continue therapy and topical lido/menthol/diclofenac

## 2014-02-22 NOTE — Progress Notes (Signed)
Patient ID: Bob Maldonado, male   DOB: 09/21/1955, 59 y.o.   MRN: [id number] Follow-up visit bilateral shoulder pain. Patient notes significant improvement right shoulder modest improvement left shoulder  Still undergoing therapy has one month left on his prescription  We gave him lidocaine diclofenac CREAM which gave him temporary relief. He is noted have severe kidney failure on dialysis so we are monitoring him with no NSAIDs  He needs other medication  Review of systems he's been feeling well and has not gained much weight but is up to 217 pounds.  Right shoulder range of motion flexion arc 150 shoulder stable motor exam normal no tenderness no swelling skin intact good pulse  Left shoulder range of motion limited to 110 positive impingement sign some anterior tenderness along the joint line stability and strength tests show mild weakness with empty can test skin pulses intact  Improving bursitis right shoulder improved bursitis left shoulder continued therapy continue diclofenac menthol and lidocaine topical cream along with tramadol  Follow-up 2 months

## 2014-02-23 ENCOUNTER — Ambulatory Visit
Admission: RE | Admit: 2014-02-23 | Discharge: 2014-02-23 | Disposition: A | Discharge status: Home / Self Care | Payer: BLUE CROSS/BLUE SHIELD | Source: Ambulatory Visit | Attending: Radiation Oncology | Admitting: Radiation Oncology

## 2014-02-23 ENCOUNTER — Ambulatory Visit: Payer: BLUE CROSS/BLUE SHIELD

## 2014-02-23 DIAGNOSIS — Z510 Encounter for antineoplastic radiation therapy: Secondary | ICD-10-CM | POA: Diagnosis not present

## 2014-02-26 ENCOUNTER — Ambulatory Visit: Payer: BLUE CROSS/BLUE SHIELD

## 2014-02-26 ENCOUNTER — Ambulatory Visit (HOSPITAL_COMMUNITY)
Admission: RE | Admit: 2014-02-26 | Discharge: 2014-02-26 | Disposition: A | Discharge status: Home / Self Care | Payer: BLUE CROSS/BLUE SHIELD | Source: Ambulatory Visit | Attending: Family Medicine | Admitting: Family Medicine

## 2014-02-26 ENCOUNTER — Other Ambulatory Visit: Payer: Self-pay | Admitting: Family Medicine

## 2014-02-26 ENCOUNTER — Ambulatory Visit
Admission: RE | Admit: 2014-02-26 | Discharge: 2014-02-26 | Disposition: A | Discharge status: Home / Self Care | Payer: BLUE CROSS/BLUE SHIELD | Source: Ambulatory Visit | Attending: Radiation Oncology | Admitting: Radiation Oncology

## 2014-02-26 ENCOUNTER — Encounter: Payer: Self-pay | Admitting: Radiation Oncology

## 2014-02-26 ENCOUNTER — Encounter (HOSPITAL_COMMUNITY): Payer: Self-pay

## 2014-02-26 VITALS — BP 106/55 | HR 60 | Temp 97.7°F | Resp 10 | Wt 225.4 lb

## 2014-02-26 DIAGNOSIS — M25619 Stiffness of unspecified shoulder, not elsewhere classified: Secondary | ICD-10-CM

## 2014-02-26 DIAGNOSIS — M25519 Pain in unspecified shoulder: Secondary | ICD-10-CM

## 2014-02-26 DIAGNOSIS — C61 Malignant neoplasm of prostate: Secondary | ICD-10-CM

## 2014-02-26 DIAGNOSIS — Z5189 Encounter for other specified aftercare: Secondary | ICD-10-CM | POA: Diagnosis not present

## 2014-02-26 DIAGNOSIS — Z510 Encounter for antineoplastic radiation therapy: Secondary | ICD-10-CM | POA: Diagnosis not present

## 2014-02-26 DIAGNOSIS — R29898 Other symptoms and signs involving the musculoskeletal system: Secondary | ICD-10-CM

## 2014-02-26 NOTE — Progress Notes (Signed)
Weekly Management Note:  Site: Prostate/seminal vesicles Current Dose:  1620  cGy Projected Dose: 4500  cGy followed by by seed implant boost  Narrative: The patient is seen today for routine under treatment assessment. CBCT/MVCT images/port films were reviewed. The chart was reviewed.   Bladder filling is less than satisfactory because of oliguria and dialysis earlier today.  Physical Examination:  Filed Vitals:   02/26/14 1534  BP: 106/55  Pulse: 60  Temp: 97.7 F (36.5 C)  Resp: 10  .  Weight: 225 lb 6.4 oz (102.241 kg).  No change.  Impression: Tolerating radiation therapy well.  Plan: Continue radiation therapy as planned.

## 2014-02-26 NOTE — Progress Notes (Signed)
He is currently in no pain. Pt complains of Loss of Sleep.  Reports a productive cough on occasion- phlegm which is "brownish green." Encouraged saline rinses and a cool mist humidifier.   Reports urinary hesitancy and urinary retention Pain with Urination (stinging).  Pt states they urinate 0 - 1 times per night.  Pt reports Constipation, reports a bowel movement 4-5 times-hard stools. Reports taking stool softeners, everyday.  BP 106/55 mmHg  Pulse 60  Temp(Src) 97.7 F (36.5 C) (Oral)  Resp 10  Wt 225 lb 6.4 oz (102.241 kg)  SpO2 100%

## 2014-02-26 NOTE — Therapy (Signed)
[place], [place], [address] Phone: [phone]   Fax:  [phone]  Occupational Therapy Treatment  Patient Details  Name: Bob Maldonado MRN: [id number] Date of Birth: 02/04/1956 Referring Provider:  Alycia Rossetti, MD  Encounter Date: 02/26/2014      OT End of Session - 02/26/14 1607    Visit Number 23   Number of Visits 33   Date for OT Re-Evaluation 03/19/14   Authorization Type BCBS   Authorization Time Period n/a   OT Start Time 1301   OT Stop Time 1346   OT Time Calculation (min) 45 min   Activity Tolerance Patient tolerated treatment well   Behavior During Therapy [facility] for tasks assessed/performed      Past Medical History  Diagnosis Date  . Diabetes mellitus without complication   . Hypertension   . Hyperlipidemia   . Gout   . Cancer     prostate / Declines treatment  . Chronic kidney disease     ESRD- Declines Dialysis  . Renal insufficiency   . Prostate cancer 03/16/11, 08/29/13    Gleason 7, volume 55 mL  . Dialysis patient   . Arthritis     Past Surgical History  Procedure Laterality Date  . Right knee  1985  . Insertion of dialysis catheter Right 05/30/2013    Procedure: INSERTION OF DIALYSIS CATHETER;  Surgeon: Conrad Ambia, MD;  Location: [place];  Service: Vascular;  Laterality: Right;  . Av fistula placement Left 05/30/2013    Procedure: Brachial vein transposition 1st stage;  Surgeon: Conrad Woodston, MD;  Location: [place];  Service: Vascular;  Laterality: Left;  . Colonoscopy    . Bascilic vein transposition Left 07/25/2013    Procedure: SECOND STAGE BRACHIAL VEIN TRANSPOSITION;  Surgeon: Conrad Eureka, MD;  Location: [place];  Service: Vascular;  Laterality: Left;  . Prostate biopsy  02/2011    Gleason 7  . Prostate biopsy  08/29/13    Gleason 7, volume 55 mL    There were no vitals taken for this visit.  Visit Diagnosis:  Pain in joint, shoulder region, unspecified  laterality  Decreased range of motion of shoulder, unspecified laterality  Shoulder weakness      Subjective Assessment - 02/26/14 1301    Symptoms "They're not as catchy as they were. it just gets me when I over-reach."   Limitations Progress as tolerated   Currently in Pain? No/denies  with no motion          [facility] OT Assessment - 02/26/14 0001    Precautions   Precautions None               OT Treatments/Exercises (OP) - 02/26/14 1304    Shoulder Exercises: Supine   Protraction PROM;5 reps;Strengthening;15 reps   Protraction Weight (lbs) 3   Horizontal ABduction PROM;5 reps;Strengthening;15 reps   Horizontal ABduction Weight (lbs) 3   External Rotation PROM;5 reps;Strengthening;15 reps   External Rotation Weight (lbs) 3   Internal Rotation PROM;5 reps;Strengthening;15 reps   Internal Rotation Weight (lbs) 3   Flexion PROM;5 reps;Strengthening;15 reps   Shoulder Flexion Weight (lbs) 3   ABduction PROM;5 reps;Strengthening;15 reps   Shoulder ABduction Weight (lbs) 3   Shoulder Exercises: Prone   Other Prone Exercises Hughston exercises 1-5, 10 repse ach AROM   Shoulder Exercises: ROM/Strengthening   Cybex Press 3 plate;20 reps   Cybex Row 3 plate;20 reps   Manual Therapy  Manual Therapy Myofascial release   Myofascial Release Myofascial release and manual stretching to bilateral UE focusing on upper arm, trapezius and scapularis region.                     OT Short Term Goals - 02/19/14 1453    OT SHORT TERM GOAL #1   Title Pt will be educated on HEP   Status Achieved   OT SHORT TERM GOAL #2   Title Pt will improve BUE shoulder PROM to [facility] in working towards improved abilities donning a shirt   Status Achieved   OT SHORT TERM GOAL #3   Title Pt will decrease fascial restrictions to min-mod in BUE for overall decreased pain   Status Achieved   OT SHORT TERM GOAL #4   Title Pt will increase strength to grossly 4/5 in BUE for improved ability  to hold onto bike handlebars   Status Achieved   OT SHORT TERM GOAL #5   Title Pt's will decrease pain to less than 4/10 during daily activites   Status Partially Met           OT Long Term Goals - 02/19/14 1454    OT LONG TERM GOAL #1   Title Pt will acheive highest level of functioning in all ADL, IADL, work, and leisure activies.   Status On-going   OT LONG TERM GOAL #2   Title Pt will achieve BUE shoulder AROM WFL for improved ability to play a round of golf   Status On-going   OT LONG TERM GOAL #3   Title Pt will decrease fascial restrictions in BUE to minimal or less for decreased overall pain   Status On-going   OT LONG TERM GOAL #4   Title Pt will improve strength in BUE to 5/5 for improved ability to engage in overhead reaching tasks   Status On-going   OT LONG TERM GOAL #5   Title Pt will have pain in BUE of less than 2/10 when donning a coat   Status On-going               Plan - 02/26/14 1608    Clinical Impression Statement Continued 3# in supine this session, with good tolerance.  Added prone Hughston exercisees - pt had increased difficulty, but was able to tolerate 10 reps of each. Decreased motion in LUE.  Pt tolerated well increase in rps with cybex press and row.     Plan Continue standing exercises        Problem List Patient Active Problem List   Diagnosis Date Noted  . Rotator cuff syndrome of both shoulders 12/25/2013  . End stage renal disease 07/21/2013  . ESRD on dialysis 06/14/2013  . Generalized weakness 06/14/2013  . Peripheral edema 05/26/2013  . Hyperkalemia 05/26/2013  . Anemia of chronic renal failure 05/26/2013  . Hyperlipidemia 07/14/2012  . Gout 07/14/2012  . Diabetes mellitus with complication 54/00/8676  . Essential hypertension, benign 12/15/2011  . Obesity 12/15/2011  . Prostate cancer 12/15/2011     Bea Graff Lemoyne Nestor, MS, OTR/L [place] [phone] 02/26/2014, [place], [place], [address] Phone: [phone]   Fax:  [phone]

## 2014-02-27 ENCOUNTER — Ambulatory Visit: Payer: BLUE CROSS/BLUE SHIELD

## 2014-02-27 ENCOUNTER — Ambulatory Visit
Admission: RE | Admit: 2014-02-27 | Discharge: 2014-02-27 | Disposition: A | Discharge status: Home / Self Care | Payer: BLUE CROSS/BLUE SHIELD | Source: Ambulatory Visit | Attending: Radiation Oncology | Admitting: Radiation Oncology

## 2014-02-27 DIAGNOSIS — Z510 Encounter for antineoplastic radiation therapy: Secondary | ICD-10-CM | POA: Diagnosis not present

## 2014-02-27 NOTE — Telephone Encounter (Signed)
Okay to refill? 

## 2014-02-27 NOTE — Telephone Encounter (Signed)
Ok to refill??  Last office visit/ refill 01/22/2014.

## 2014-02-27 NOTE — Telephone Encounter (Signed)
Medication called to pharmacy. 

## 2014-02-28 ENCOUNTER — Ambulatory Visit (HOSPITAL_COMMUNITY)
Admission: RE | Admit: 2014-02-28 | Discharge: 2014-02-28 | Disposition: A | Discharge status: Home / Self Care | Payer: BLUE CROSS/BLUE SHIELD | Source: Ambulatory Visit | Attending: Family Medicine | Admitting: Family Medicine

## 2014-02-28 ENCOUNTER — Ambulatory Visit: Payer: BLUE CROSS/BLUE SHIELD

## 2014-02-28 ENCOUNTER — Encounter (HOSPITAL_COMMUNITY): Payer: Self-pay

## 2014-02-28 ENCOUNTER — Ambulatory Visit
Admission: RE | Admit: 2014-02-28 | Discharge: 2014-02-28 | Disposition: A | Discharge status: Home / Self Care | Payer: BLUE CROSS/BLUE SHIELD | Source: Ambulatory Visit | Attending: Radiation Oncology | Admitting: Radiation Oncology

## 2014-02-28 DIAGNOSIS — Z5189 Encounter for other specified aftercare: Secondary | ICD-10-CM | POA: Diagnosis not present

## 2014-02-28 DIAGNOSIS — M25519 Pain in unspecified shoulder: Secondary | ICD-10-CM

## 2014-02-28 DIAGNOSIS — M25619 Stiffness of unspecified shoulder, not elsewhere classified: Secondary | ICD-10-CM

## 2014-02-28 DIAGNOSIS — R29898 Other symptoms and signs involving the musculoskeletal system: Secondary | ICD-10-CM

## 2014-02-28 DIAGNOSIS — Z510 Encounter for antineoplastic radiation therapy: Secondary | ICD-10-CM | POA: Diagnosis not present

## 2014-02-28 NOTE — Therapy (Signed)
[place], [place], [address] Phone: [phone]   Fax:  [phone]  Occupational Therapy Treatment  Patient Details  Name: Bob Maldonado MRN: [id number] Date of Birth: 06-Mar-1955 Referring Provider:  Alycia Rossetti, MD  Encounter Date: 02/28/2014      OT End of Session - 02/28/14 1422    Visit Number 24   Number of Visits 33   Date for OT Re-Evaluation 03/19/14   Authorization Type BCBS   Authorization Time Period n/a   OT Start Time 1308   OT Stop Time 1350   OT Time Calculation (min) 42 min   Activity Tolerance Patient tolerated treatment well   Behavior During Therapy [facility] for tasks assessed/performed      Past Medical History  Diagnosis Date  . Diabetes mellitus without complication   . Hypertension   . Hyperlipidemia   . Gout   . Cancer     prostate / Declines treatment  . Chronic kidney disease     ESRD- Declines Dialysis  . Renal insufficiency   . Prostate cancer 03/16/11, 08/29/13    Gleason 7, volume 55 mL  . Dialysis patient   . Arthritis     Past Surgical History  Procedure Laterality Date  . Right knee  1985  . Insertion of dialysis catheter Right 05/30/2013    Procedure: INSERTION OF DIALYSIS CATHETER;  Surgeon: Conrad Clifton, MD;  Location: [place];  Service: Vascular;  Laterality: Right;  . Av fistula placement Left 05/30/2013    Procedure: Brachial vein transposition 1st stage;  Surgeon: Conrad Manorville, MD;  Location: [place];  Service: Vascular;  Laterality: Left;  . Colonoscopy    . Bascilic vein transposition Left 07/25/2013    Procedure: SECOND STAGE BRACHIAL VEIN TRANSPOSITION;  Surgeon: Conrad Titus, MD;  Location: [place];  Service: Vascular;  Laterality: Left;  . Prostate biopsy  02/2011    Gleason 7  . Prostate biopsy  08/29/13    Gleason 7, volume 55 mL    There were no vitals taken for this visit.  Visit Diagnosis:  Pain in joint, shoulder region, unspecified  laterality  Decreased range of motion of shoulder, unspecified laterality  Shoulder weakness      Subjective Assessment - 02/28/14 1306    Symptoms "Passively, its great.  But putting on a coat is sometimes a 5 or a 6."   Currently in Pain? No/denies  passively          [facility] OT Assessment - 02/28/14 0001    Precautions   Precautions None               OT Treatments/Exercises (OP) - 02/28/14 1308    Shoulder Exercises: Supine   Protraction PROM;5 reps;Strengthening;20 reps   Protraction Weight (lbs) 3   Horizontal ABduction PROM;5 reps;Strengthening;20 reps   Horizontal ABduction Weight (lbs) 3   External Rotation PROM;5 reps;Strengthening;20 reps   External Rotation Weight (lbs) 3   Internal Rotation PROM;5 reps;Strengthening;20 reps   Internal Rotation Weight (lbs) 3   Flexion PROM;5 reps;Strengthening;20 reps   Shoulder Flexion Weight (lbs) 3   ABduction PROM;5 reps;Strengthening;20 reps   Shoulder ABduction Weight (lbs) 3   Shoulder Exercises: Standing   Protraction AROM;15 reps   External Rotation AROM;15 reps   Internal Rotation AROM;15 reps   Flexion AROM;15 reps   ABduction AROM;15 reps   Other Standing Exercises horizontal abduction AROM 15 times, external rotation AROM 15x  Shoulder Exercises: Pulleys   Flexion --  1:30 in both flexion and abduction   Manual Therapy   Manual Therapy Myofascial release   Myofascial Release Myofascial release and manual stretching to bilateral UE focusing on upper arm, trapezius and scapularis region.                     OT Short Term Goals - 02/19/14 1453    OT SHORT TERM GOAL #1   Title Pt will be educated on HEP   Status Achieved   OT SHORT TERM GOAL #2   Title Pt will improve BUE shoulder PROM to [facility] in working towards improved abilities donning a shirt   Status Achieved   OT SHORT TERM GOAL #3   Title Pt will decrease fascial restrictions to min-mod in BUE for overall decreased pain   Status  Achieved   OT SHORT TERM GOAL #4   Title Pt will increase strength to grossly 4/5 in BUE for improved ability to hold onto bike handlebars   Status Achieved   OT SHORT TERM GOAL #5   Title Pt's will decrease pain to less than 4/10 during daily activites   Status Partially Met           OT Long Term Goals - 02/19/14 1454    OT LONG TERM GOAL #1   Title Pt will acheive highest level of functioning in all ADL, IADL, work, and leisure activies.   Status On-going   OT LONG TERM GOAL #2   Title Pt will achieve BUE shoulder AROM WFL for improved ability to play a round of golf   Status On-going   OT LONG TERM GOAL #3   Title Pt will decrease fascial restrictions in BUE to minimal or less for decreased overall pain   Status On-going   OT LONG TERM GOAL #4   Title Pt will improve strength in BUE to 5/5 for improved ability to engage in overhead reaching tasks   Status On-going   OT LONG TERM GOAL #5   Title Pt will have pain in BUE of less than 2/10 when donning a coat   Status On-going               Plan - 02/28/14 1422    Clinical Impression Statement Increased supine reps to 20 this session with good tolerance.  Pt expressed increased tightness in L shoulder this session - added pulleys at end of session for increased stretch. Continued standing AROM this session at 15 reps each - pt demonstrated good form and good tolerance for reps.     Plan P: Posterior capsule stretch, IR towel stretch          Problem List Patient Active Problem List   Diagnosis Date Noted  . Rotator cuff syndrome of both shoulders 12/25/2013  . End stage renal disease 07/21/2013  . ESRD on dialysis 06/14/2013  . Generalized weakness 06/14/2013  . Peripheral edema 05/26/2013  . Hyperkalemia 05/26/2013  . Anemia of chronic renal failure 05/26/2013  . Hyperlipidemia 07/14/2012  . Gout 07/14/2012  . Diabetes mellitus with complication 50/93/2671  . Essential hypertension, benign 12/15/2011  .  Obesity 12/15/2011  . Prostate cancer 12/15/2011    Bea Graff Namon Villarin, MS, OTR/L [place] [phone] 02/28/2014, 2:23 PM   [place] [address] [place], [place], [address] Phone: [phone]   Fax:  [phone]

## 2014-03-01 ENCOUNTER — Ambulatory Visit: Payer: BLUE CROSS/BLUE SHIELD

## 2014-03-01 ENCOUNTER — Ambulatory Visit
Admission: RE | Admit: 2014-03-01 | Discharge: 2014-03-01 | Disposition: A | Discharge status: Home / Self Care | Payer: BLUE CROSS/BLUE SHIELD | Source: Ambulatory Visit | Attending: Radiation Oncology | Admitting: Radiation Oncology

## 2014-03-01 DIAGNOSIS — Z510 Encounter for antineoplastic radiation therapy: Secondary | ICD-10-CM | POA: Diagnosis not present

## 2014-03-02 ENCOUNTER — Ambulatory Visit (HOSPITAL_COMMUNITY)
Admission: RE | Admit: 2014-03-02 | Discharge: 2014-03-02 | Disposition: A | Discharge status: Home / Self Care | Payer: BLUE CROSS/BLUE SHIELD | Source: Ambulatory Visit | Attending: Orthopedic Surgery | Admitting: Orthopedic Surgery

## 2014-03-02 ENCOUNTER — Ambulatory Visit
Admission: RE | Admit: 2014-03-02 | Discharge: 2014-03-02 | Disposition: A | Discharge status: Home / Self Care | Payer: BLUE CROSS/BLUE SHIELD | Source: Ambulatory Visit | Attending: Radiation Oncology | Admitting: Radiation Oncology

## 2014-03-02 ENCOUNTER — Ambulatory Visit: Payer: BLUE CROSS/BLUE SHIELD

## 2014-03-02 ENCOUNTER — Encounter (HOSPITAL_COMMUNITY): Payer: Self-pay

## 2014-03-02 DIAGNOSIS — Z5189 Encounter for other specified aftercare: Secondary | ICD-10-CM | POA: Diagnosis not present

## 2014-03-02 DIAGNOSIS — R29898 Other symptoms and signs involving the musculoskeletal system: Secondary | ICD-10-CM

## 2014-03-02 DIAGNOSIS — M25619 Stiffness of unspecified shoulder, not elsewhere classified: Secondary | ICD-10-CM

## 2014-03-02 DIAGNOSIS — Z510 Encounter for antineoplastic radiation therapy: Secondary | ICD-10-CM | POA: Diagnosis not present

## 2014-03-02 DIAGNOSIS — M25519 Pain in unspecified shoulder: Secondary | ICD-10-CM

## 2014-03-02 NOTE — Therapy (Signed)
[place], [place], [address] Phone: [phone]   Fax:  [phone]  Occupational Therapy Treatment  Patient Details  Name: Bob Maldonado MRN: [id number] Date of Birth: 1955/06/25 Referring Provider:  Carole Civil, MD  Encounter Date: 03/02/2014      OT End of Session - 03/02/14 1420    Visit Number 25   Number of Visits 33   Date for OT Re-Evaluation 03/19/14   Authorization Type BCBS   Authorization Time Period n/a   OT Start Time 1351   OT Stop Time 1418   OT Time Calculation (min) 27 min   Activity Tolerance Patient tolerated treatment well   Behavior During Therapy [facility] for tasks assessed/performed      Past Medical History  Diagnosis Date  . Diabetes mellitus without complication   . Hypertension   . Hyperlipidemia   . Gout   . Cancer     prostate / Declines treatment  . Chronic kidney disease     ESRD- Declines Dialysis  . Renal insufficiency   . Prostate cancer 03/16/11, 08/29/13    Gleason 7, volume 55 mL  . Dialysis patient   . Arthritis     Past Surgical History  Procedure Laterality Date  . Right knee  1985  . Insertion of dialysis catheter Right 05/30/2013    Procedure: INSERTION OF DIALYSIS CATHETER;  Surgeon: Conrad Fannett, MD;  Location: [facility] [place];  Service: Vascular;  Laterality: Right;  . Av fistula placement Left 05/30/2013    Procedure: Brachial vein transposition 1st stage;  Surgeon: Conrad Westby, MD;  Location: [place];  Service: Vascular;  Laterality: Left;  . Colonoscopy    . Bascilic vein transposition Left 07/25/2013    Procedure: SECOND STAGE BRACHIAL VEIN TRANSPOSITION;  Surgeon: Conrad DeRidder, MD;  Location: [place];  Service: Vascular;  Laterality: Left;  . Prostate biopsy  02/2011    Gleason 7  . Prostate biopsy  08/29/13    Gleason 7, volume 55 mL    There were no vitals taken for this visit.  Visit Diagnosis:  Pain in joint, shoulder region, unspecified  laterality  Decreased range of motion of shoulder, unspecified laterality  Shoulder weakness      Subjective Assessment - 03/02/14 1352    Symptoms "I could tell it was going to rain today even before i saw the forecast."   Limitations Progress as tolerated   Currently in Pain? Yes   Pain Score 6    Pain Location Shoulder   Pain Orientation Right;Left   Pain Descriptors / Indicators Aching   Pain Type Chronic pain          [facility] OT Assessment - 03/02/14 0001    Precautions   Precautions None               OT Treatments/Exercises (OP) - 03/02/14 1353    Shoulder Exercises: Supine   Protraction PROM;5 reps;Both   Horizontal ABduction PROM;Both;5 reps   External Rotation PROM;Both;5 reps   Internal Rotation PROM;Both;5 reps   Flexion PROM;Both;5 reps   ABduction PROM;Both;5 reps   Manual Therapy   Manual Therapy Myofascial release   Myofascial Release Myofascial release and manual stretching to bilateral UE focusing on upper arm, trapezius and scapularis region.                     OT Short Term Goals - 02/19/14 1453    OT  SHORT TERM GOAL #1   Title Pt will be educated on HEP   Status Achieved   OT SHORT TERM GOAL #2   Title Pt will improve BUE shoulder PROM to [facility] in working towards improved abilities donning a shirt   Status Achieved   OT SHORT TERM GOAL #3   Title Pt will decrease fascial restrictions to min-mod in BUE for overall decreased pain   Status Achieved   OT SHORT TERM GOAL #4   Title Pt will increase strength to grossly 4/5 in BUE for improved ability to hold onto bike handlebars   Status Achieved   OT SHORT TERM GOAL #5   Title Pt's will decrease pain to less than 4/10 during daily activites   Status Partially Met           OT Long Term Goals - 02/19/14 1454    OT LONG TERM GOAL #1   Title Pt will acheive highest level of functioning in all ADL, IADL, work, and leisure activies.   Status On-going   OT LONG TERM GOAL #2    Title Pt will achieve BUE shoulder AROM WFL for improved ability to play a round of golf   Status On-going   OT LONG TERM GOAL #3   Title Pt will decrease fascial restrictions in BUE to minimal or less for decreased overall pain   Status On-going   OT LONG TERM GOAL #4   Title Pt will improve strength in BUE to 5/5 for improved ability to engage in overhead reaching tasks   Status On-going   OT LONG TERM GOAL #5   Title Pt will have pain in BUE of less than 2/10 when donning a coat   Status On-going               Plan - 03/02/14 1420    Clinical Impression Statement pt arrived for session and indicated need to leave early due to having dialysis appointment in [place].  pt with increased pain this session, so focused session on manual therapy and stretching.  pt indicated decresed pain at end of session.     Plan P: Posterior capsule stretch, IR towel stretch         Problem List Patient Active Problem List   Diagnosis Date Noted  . Rotator cuff syndrome of both shoulders 12/25/2013  . End stage renal disease 07/21/2013  . ESRD on dialysis 06/14/2013  . Generalized weakness 06/14/2013  . Peripheral edema 05/26/2013  . Hyperkalemia 05/26/2013  . Anemia of chronic renal failure 05/26/2013  . Hyperlipidemia 07/14/2012  . Gout 07/14/2012  . Diabetes mellitus with complication 45/80/9983  . Essential hypertension, benign 12/15/2011  . Obesity 12/15/2011  . Prostate cancer 12/15/2011    Bea Graff Brittinee Risk, MS, OTR/L [place] [phone] 03/02/2014, 2:21 PM  [place] [address] [place], [place], [address] Phone: [phone]   Fax:  [phone]

## 2014-03-05 ENCOUNTER — Ambulatory Visit: Payer: BLUE CROSS/BLUE SHIELD

## 2014-03-05 ENCOUNTER — Ambulatory Visit
Admission: RE | Admit: 2014-03-05 | Discharge: 2014-03-05 | Disposition: A | Discharge status: Home / Self Care | Payer: BLUE CROSS/BLUE SHIELD | Source: Ambulatory Visit | Attending: Radiation Oncology | Admitting: Radiation Oncology

## 2014-03-05 ENCOUNTER — Encounter: Payer: Self-pay | Admitting: Radiation Oncology

## 2014-03-05 VITALS — BP 124/66 | HR 59 | Temp 97.9°F | Resp 12 | Wt 225.2 lb

## 2014-03-05 DIAGNOSIS — C61 Malignant neoplasm of prostate: Secondary | ICD-10-CM

## 2014-03-05 DIAGNOSIS — Z510 Encounter for antineoplastic radiation therapy: Secondary | ICD-10-CM | POA: Diagnosis not present

## 2014-03-05 NOTE — Progress Notes (Signed)
He is currently in no pain.  Reports urinary frequency and sweats and pain with urination (stinging).  Pt states they urinate 0 - 1 times per night.  Pt reports constipation, reports not having a bowel movement in a week.  Reports taking Senna-S "when he thinks about it" BP 124/66 mmHg  Pulse 59  Temp(Src) 97.9 F (36.6 C) (Oral)  Resp 12  Wt 225 lb 3.2 oz (102.15 kg)  SpO2 100%

## 2014-03-05 NOTE — Addendum Note (Signed)
Encounter addended by: Rexene Edison, MD on: 03/05/2014  3:40 PM<BR>     Documentation filed: Notes Section

## 2014-03-05 NOTE — Progress Notes (Addendum)
Weekly Management Note:  Site: Prostate Current Dose:  2520  cGy Projected Dose: 4500  cGy followed by seed implant boost  Narrative: The patient is seen today for routine under treatment assessment. CBCT/MVCT images/port films were reviewed. The chart was reviewed.   Bladder filling was good today.  No new GU or GI difficulties.  He does have occasional urinary urgency.  He still has difficulty with constipation because of fluid restriction.  Of note is that he did "scratch" his left eye while waking up after her nap.  He believes that he also scratch his right eye and both eyes are "red".  No significant eye discomfort.  Physical Examination:  Filed Vitals:   03/05/14 1517  BP: 124/66  Pulse: 59  Temp: 97.9 F (36.6 C)  Resp: 12  .  Weight: 225 lb 3.2 oz (102.15 kg).  No change.  On inspection of his eyes he does have bilateral conjunctivitis, left greater than right.  He will see his nephrologist this Wednesday for possible eyedrops, or he will seek Dr. Zadie Rhine for further evaluation.  Impression: Tolerating radiation therapy well.  Plan: Continue radiation therapy as planned.

## 2014-03-06 ENCOUNTER — Ambulatory Visit: Payer: BLUE CROSS/BLUE SHIELD

## 2014-03-06 ENCOUNTER — Ambulatory Visit
Admission: RE | Admit: 2014-03-06 | Discharge: 2014-03-06 | Disposition: A | Discharge status: Home / Self Care | Payer: BLUE CROSS/BLUE SHIELD | Source: Ambulatory Visit | Attending: Radiation Oncology | Admitting: Radiation Oncology

## 2014-03-06 DIAGNOSIS — Z510 Encounter for antineoplastic radiation therapy: Secondary | ICD-10-CM | POA: Diagnosis not present

## 2014-03-07 ENCOUNTER — Ambulatory Visit: Payer: BLUE CROSS/BLUE SHIELD

## 2014-03-07 ENCOUNTER — Ambulatory Visit (HOSPITAL_COMMUNITY)
Admission: RE | Admit: 2014-03-07 | Discharge: 2014-03-07 | Disposition: A | Discharge status: Home / Self Care | Payer: BLUE CROSS/BLUE SHIELD | Source: Ambulatory Visit | Attending: Orthopedic Surgery | Admitting: Orthopedic Surgery

## 2014-03-07 ENCOUNTER — Ambulatory Visit
Admission: RE | Admit: 2014-03-07 | Discharge: 2014-03-07 | Disposition: A | Discharge status: Home / Self Care | Payer: BLUE CROSS/BLUE SHIELD | Source: Ambulatory Visit | Attending: Radiation Oncology | Admitting: Radiation Oncology

## 2014-03-07 ENCOUNTER — Encounter (HOSPITAL_COMMUNITY): Payer: Self-pay

## 2014-03-07 DIAGNOSIS — R29898 Other symptoms and signs involving the musculoskeletal system: Secondary | ICD-10-CM

## 2014-03-07 DIAGNOSIS — M25619 Stiffness of unspecified shoulder, not elsewhere classified: Secondary | ICD-10-CM

## 2014-03-07 DIAGNOSIS — Z510 Encounter for antineoplastic radiation therapy: Secondary | ICD-10-CM | POA: Diagnosis not present

## 2014-03-07 DIAGNOSIS — Z5189 Encounter for other specified aftercare: Secondary | ICD-10-CM | POA: Diagnosis not present

## 2014-03-07 DIAGNOSIS — M25519 Pain in unspecified shoulder: Secondary | ICD-10-CM

## 2014-03-07 NOTE — Therapy (Signed)
[place], [place], [address] Phone: [phone]   Fax:  [phone]  Occupational Therapy Treatment  Patient Details  Name: Bob Maldonado MRN: [id number] Date of Birth: 01-16-1956 Referring Provider:  Carole Civil, MD  Encounter Date: 03/07/2014      OT End of Session - 03/07/14 1432    Visit Number 26   Number of Visits 33   Date for OT Re-Evaluation 03/19/14   Authorization Type BCBS   Authorization Time Period n/a   OT Start Time 1311   OT Stop Time 1410   OT Time Calculation (min) 59 min   Activity Tolerance Patient tolerated treatment well   Behavior During Therapy [facility] for tasks assessed/performed      Past Medical History  Diagnosis Date  . Diabetes mellitus without complication   . Hypertension   . Hyperlipidemia   . Gout   . Cancer     prostate / Declines treatment  . Chronic kidney disease     ESRD- Declines Dialysis  . Renal insufficiency   . Prostate cancer 03/16/11, 08/29/13    Gleason 7, volume 55 mL  . Dialysis patient   . Arthritis     Past Surgical History  Procedure Laterality Date  . Right knee  1985  . Insertion of dialysis catheter Right 05/30/2013    Procedure: INSERTION OF DIALYSIS CATHETER;  Surgeon: Conrad Reynolds, MD;  Location: [place];  Service: Vascular;  Laterality: Right;  . Av fistula placement Left 05/30/2013    Procedure: Brachial vein transposition 1st stage;  Surgeon: Conrad Stanley, MD;  Location: [place];  Service: Vascular;  Laterality: Left;  . Colonoscopy    . Bascilic vein transposition Left 07/25/2013    Procedure: SECOND STAGE BRACHIAL VEIN TRANSPOSITION;  Surgeon: Conrad Donnellson, MD;  Location: [place];  Service: Vascular;  Laterality: Left;  . Prostate biopsy  02/2011    Gleason 7  . Prostate biopsy  08/29/13    Gleason 7, volume 55 mL    There were no vitals taken for this visit.  Visit Diagnosis:  Pain in joint, shoulder region, unspecified  laterality  Decreased range of motion of shoulder, unspecified laterality  Shoulder weakness      Subjective Assessment - 03/07/14 1426    Symptoms S: My left shoulder is sore today.    Currently in Pain? Yes   Pain Score 4    Pain Location Shoulder   Pain Orientation Left   Pain Descriptors / Indicators Aching   Pain Type Chronic pain          [facility] OT Assessment - 03/07/14 1427    Assessment   Diagnosis Bilateral Rotator Cuff syndrome   Precautions   Precautions None               OT Treatments/Exercises (OP) - 03/07/14 1428    Shoulder Exercises: Supine   Protraction Strengthening;20 reps;Both   Protraction Weight (lbs) 3   Horizontal ABduction Strengthening;20 reps;Both   Horizontal ABduction Weight (lbs) 3   External Rotation Strengthening;20 reps;Both   External Rotation Weight (lbs) 3   Internal Rotation Strengthening;20 reps;Both   Internal Rotation Weight (lbs) 3   Flexion Strengthening;20 reps;Both   Shoulder Flexion Weight (lbs) 3   ABduction Strengthening;20 reps;Both   Shoulder ABduction Weight (lbs) 3   Shoulder Exercises: Prone   Other Prone Exercises [place] exercises 1-5, 10 reps each AROM. Unable to complete flexion LUE  Shoulder Exercises: Standing   Protraction AROM;15 reps   External Rotation AROM;15 reps   Internal Rotation AROM;15 reps   Flexion AROM;15 reps   ABduction AROM;15 reps   Shoulder Exercises: ROM/Strengthening   Cybex Press 3 plate;20 reps   Cybex Row 3 plate;20 reps   Shoulder Exercises: Stretch   Other Shoulder Stretches Posterior capsule stretch 5x10"   Other Shoulder Stretches IR stretch using towel 5x10"   Manual Therapy   Manual Therapy Myofascial release   Myofascial Release Myofascial release to left anterior deltoid and upper trapezius to decrease fascial restrictions and increase joint mobility in a pain free zone.                    OT Short Term Goals - 02/19/14 1453    OT SHORT TERM GOAL  #1   Title Pt will be educated on HEP   Status Achieved   OT SHORT TERM GOAL #2   Title Pt will improve BUE shoulder PROM to [facility] in working towards improved abilities donning a shirt   Status Achieved   OT SHORT TERM GOAL #3   Title Pt will decrease fascial restrictions to min-mod in BUE for overall decreased pain   Status Achieved   OT SHORT TERM GOAL #4   Title Pt will increase strength to grossly 4/5 in BUE for improved ability to hold onto bike handlebars   Status Achieved   OT SHORT TERM GOAL #5   Title Pt's will decrease pain to less than 4/10 during daily activites   Status Partially Met           OT Long Term Goals - 02/19/14 1454    OT LONG TERM GOAL #1   Title Pt will acheive highest level of functioning in all ADL, IADL, work, and leisure activies.   Status On-going   OT LONG TERM GOAL #2   Title Pt will achieve BUE shoulder AROM WFL for improved ability to play a round of golf   Status On-going   OT LONG TERM GOAL #3   Title Pt will decrease fascial restrictions in BUE to minimal or less for decreased overall pain   Status On-going   OT LONG TERM GOAL #4   Title Pt will improve strength in BUE to 5/5 for improved ability to engage in overhead reaching tasks   Status On-going   OT LONG TERM GOAL #5   Title Pt will have pain in BUE of less than 2/10 when donning a coat   Status On-going               Plan - 03/07/14 1433    Clinical Impression Statement A: Patient arrived early for session. Exercises were completed for OT tech with supervision from OT. Added IR stretch and posterior capsule stretch. patient tolerated well.    Plan P: Add ball on the wall        Problem List Patient Active Problem List   Diagnosis Date Noted  . Rotator cuff syndrome of both shoulders 12/25/2013  . End stage renal disease 07/21/2013  . ESRD on dialysis 06/14/2013  . Generalized weakness 06/14/2013  . Peripheral edema 05/26/2013  . Hyperkalemia 05/26/2013  .  Anemia of chronic renal failure 05/26/2013  . Hyperlipidemia 07/14/2012  . Gout 07/14/2012  . Diabetes mellitus with complication 28/78/6767  . Essential hypertension, benign 12/15/2011  . Obesity 12/15/2011  . Prostate cancer 12/15/2011   Exercises completed by OT tech Ihor Austin with supervision  of OTR/L.  Ailene Ravel, OTR/L,CBIS  [phone]  03/07/2014, 2:35 PM  [place] [address] [place], [place], [address] Phone: [phone]   Fax:  [phone]

## 2014-03-08 ENCOUNTER — Ambulatory Visit
Admission: RE | Admit: 2014-03-08 | Discharge: 2014-03-08 | Disposition: A | Discharge status: Home / Self Care | Payer: BLUE CROSS/BLUE SHIELD | Source: Ambulatory Visit | Attending: Radiation Oncology | Admitting: Radiation Oncology

## 2014-03-08 ENCOUNTER — Ambulatory Visit: Payer: BLUE CROSS/BLUE SHIELD

## 2014-03-08 DIAGNOSIS — Z510 Encounter for antineoplastic radiation therapy: Secondary | ICD-10-CM | POA: Diagnosis not present

## 2014-03-09 ENCOUNTER — Ambulatory Visit: Payer: BLUE CROSS/BLUE SHIELD

## 2014-03-09 ENCOUNTER — Ambulatory Visit (HOSPITAL_COMMUNITY): Payer: BLUE CROSS/BLUE SHIELD

## 2014-03-12 ENCOUNTER — Ambulatory Visit
Admission: RE | Admit: 2014-03-12 | Discharge: 2014-03-12 | Disposition: A | Discharge status: Home / Self Care | Payer: BLUE CROSS/BLUE SHIELD | Source: Ambulatory Visit | Attending: Radiation Oncology | Admitting: Radiation Oncology

## 2014-03-12 ENCOUNTER — Ambulatory Visit: Payer: BLUE CROSS/BLUE SHIELD

## 2014-03-12 ENCOUNTER — Encounter: Payer: Self-pay | Admitting: Radiation Oncology

## 2014-03-12 VITALS — BP 145/58 | HR 56 | Temp 98.2°F | Ht 75.0 in | Wt 225.2 lb

## 2014-03-12 DIAGNOSIS — C61 Malignant neoplasm of prostate: Secondary | ICD-10-CM

## 2014-03-12 DIAGNOSIS — Z510 Encounter for antineoplastic radiation therapy: Secondary | ICD-10-CM | POA: Diagnosis not present

## 2014-03-12 NOTE — Progress Notes (Signed)
Bob Maldonado reports that he is noting urinary frequency during the day with some "stinging", and feels as if he is not emptying completely.  Intermittent nocturia.  Denies any dark or malodorous urine.

## 2014-03-12 NOTE — Progress Notes (Signed)
Weekly Management Note:  Site: Prostate Current Dose:  3240  cGy Projected Dose: 4500  cGy followed by seed implant boost  Narrative: The patient is seen today for routine under treatment assessment. CBCT/MVCT images/port films were reviewed. The chart was reviewed.   Bladder filling is less than ideal but satisfactory considering his oliguria.  He does have some increasing urinary frequency with "stinging".  Physical Examination:  Filed Vitals:   03/12/14 1450  BP: 145/58  Pulse: 56  Temp: 98.2 F (36.8 C)  .  Weight: 225 lb 3.2 oz (102.15 kg).  No change.  Impression: Tolerating radiation therapy well.  Plan: Continue radiation therapy as planned.

## 2014-03-13 ENCOUNTER — Ambulatory Visit
Admission: RE | Admit: 2014-03-13 | Discharge: 2014-03-13 | Disposition: A | Discharge status: Home / Self Care | Payer: BLUE CROSS/BLUE SHIELD | Source: Ambulatory Visit | Attending: Radiation Oncology | Admitting: Radiation Oncology

## 2014-03-13 ENCOUNTER — Ambulatory Visit: Payer: BLUE CROSS/BLUE SHIELD

## 2014-03-13 DIAGNOSIS — Z510 Encounter for antineoplastic radiation therapy: Secondary | ICD-10-CM | POA: Diagnosis not present

## 2014-03-14 ENCOUNTER — Encounter (HOSPITAL_COMMUNITY): Payer: Self-pay

## 2014-03-14 ENCOUNTER — Ambulatory Visit: Payer: BLUE CROSS/BLUE SHIELD

## 2014-03-14 ENCOUNTER — Ambulatory Visit (HOSPITAL_COMMUNITY)
Admission: RE | Admit: 2014-03-14 | Discharge: 2014-03-14 | Disposition: A | Discharge status: Home / Self Care | Payer: BLUE CROSS/BLUE SHIELD | Source: Ambulatory Visit | Attending: Orthopedic Surgery | Admitting: Orthopedic Surgery

## 2014-03-14 ENCOUNTER — Ambulatory Visit
Admission: RE | Admit: 2014-03-14 | Discharge: 2014-03-14 | Disposition: A | Discharge status: Home / Self Care | Payer: BLUE CROSS/BLUE SHIELD | Source: Ambulatory Visit | Attending: Radiation Oncology | Admitting: Radiation Oncology

## 2014-03-14 DIAGNOSIS — Z510 Encounter for antineoplastic radiation therapy: Secondary | ICD-10-CM | POA: Diagnosis not present

## 2014-03-14 DIAGNOSIS — R29898 Other symptoms and signs involving the musculoskeletal system: Secondary | ICD-10-CM

## 2014-03-14 DIAGNOSIS — M25619 Stiffness of unspecified shoulder, not elsewhere classified: Secondary | ICD-10-CM

## 2014-03-14 DIAGNOSIS — M25519 Pain in unspecified shoulder: Secondary | ICD-10-CM

## 2014-03-14 DIAGNOSIS — Z5189 Encounter for other specified aftercare: Secondary | ICD-10-CM | POA: Diagnosis not present

## 2014-03-14 NOTE — Therapy (Signed)
[place], [place], [address] Phone: [phone]   Fax:  [phone]  Occupational Therapy Treatment  Patient Details  Name: Bob Maldonado MRN: [id number] Date of Birth: May 03, 1955 Referring Provider:  Carole Civil, MD  Encounter Date: 03/14/2014      OT End of Session - 03/14/14 1419    Visit Number 27   Number of Visits 33   Date for OT Re-Evaluation 03/19/14   Authorization Type BCBS   Authorization Time Period n/a   OT Start Time 1400   OT Stop Time 1415   OT Time Calculation (min) 15 min   Activity Tolerance Patient tolerated treatment well   Behavior During Therapy [facility] for tasks assessed/performed      Past Medical History  Diagnosis Date  . Diabetes mellitus without complication   . Hypertension   . Hyperlipidemia   . Gout   . Cancer     prostate / Declines treatment  . Chronic kidney disease     ESRD- Declines Dialysis  . Renal insufficiency   . Prostate cancer 03/16/11, 08/29/13    Gleason 7, volume 55 mL  . Dialysis patient   . Arthritis     Past Surgical History  Procedure Laterality Date  . Right knee  1985  . Insertion of dialysis catheter Right 05/30/2013    Procedure: INSERTION OF DIALYSIS CATHETER;  Surgeon: Conrad Tununak, MD;  Location: [place];  Service: Vascular;  Laterality: Right;  . Av fistula placement Left 05/30/2013    Procedure: Brachial vein transposition 1st stage;  Surgeon: Conrad [place]insville, MD;  Location: [place];  Service: Vascular;  Laterality: Left;  . Colonoscopy    . Bascilic vein transposition Left 07/25/2013    Procedure: SECOND STAGE BRACHIAL VEIN TRANSPOSITION;  Surgeon: Conrad Putnam, MD;  Location: [place];  Service: Vascular;  Laterality: Left;  . Prostate biopsy  02/2011    Gleason 7  . Prostate biopsy  08/29/13    Gleason 7, volume 55 mL    There were no vitals taken for this visit.  Visit Diagnosis:  Pain in joint, shoulder region, unspecified  laterality  Decreased range of motion of shoulder, unspecified laterality  Shoulder weakness      Subjective Assessment - 03/14/14 1401    Symptoms S: It just feels like a catch in my left shoulder.    Currently in Pain? No/denies          [facility] OT Assessment - 03/14/14 1402    Assessment   Diagnosis Bilateral Rotator Cuff syndrome   Precautions   Precautions None               OT Treatments/Exercises (OP) - 03/14/14 1402    Shoulder Exercises: Supine   Protraction Strengthening;20 reps;Both   Protraction Weight (lbs) 3   Horizontal ABduction Strengthening;20 reps;Both   Horizontal ABduction Weight (lbs) 3   External Rotation Strengthening;20 reps;Both   External Rotation Weight (lbs) 3   Internal Rotation Strengthening;20 reps;Both   Internal Rotation Weight (lbs) 3   Flexion Strengthening;20 reps;Both   Shoulder Flexion Weight (lbs) 3   ABduction Strengthening;20 reps;Both   Shoulder ABduction Weight (lbs) 3   Shoulder Exercises: ROM/Strengthening   Cybex Press 3 plate;20 reps   Cybex Row 3 plate;20 reps   Proximal Shoulder Strengthening, Supine 20X with 3#                  OT Short Term  Goals - 03/14/14 1425    OT SHORT TERM GOAL #1   Title Pt will be educated on HEP   OT [place] #2   Title Pt will improve BUE shoulder PROM to [facility] in working towards improved abilities donning a shirt   OT SHORT TERM GOAL #3   Title Pt will decrease fascial restrictions to min-mod in BUE for overall decreased pain   OT SHORT TERM GOAL #4   Title Pt will increase strength to grossly 4/5 in BUE for improved ability to hold onto bike handlebars   OT SHORT TERM GOAL #5   Title Pt's will decrease pain to less than 4/10 during daily activites   Status Partially Met           OT Long Term Goals - 02/19/14 1454    OT LONG TERM GOAL #1   Title Pt will acheive highest level of functioning in all ADL, IADL, work, and leisure activies.   Status On-going    OT LONG TERM GOAL #2   Title Pt will achieve BUE shoulder AROM WFL for improved ability to play a round of golf   Status On-going   OT LONG TERM GOAL #3   Title Pt will decrease fascial restrictions in BUE to minimal or less for decreased overall pain   Status On-going   OT LONG TERM GOAL #4   Title Pt will improve strength in BUE to 5/5 for improved ability to engage in overhead reaching tasks   Status On-going   OT LONG TERM GOAL #5   Title Pt will have pain in BUE of less than 2/10 when donning a coat   Status On-going               Plan - 03/14/14 1420    Clinical Impression Statement A: Pt overslept from a nap and arrived to therapy late. Patient also had to leave early for radiation appt. Unable to add ball on the wall due to time constraint.    Plan P: Add ball on the wall.         Problem List Patient Active Problem List   Diagnosis Date Noted  . Rotator cuff syndrome of both shoulders 12/25/2013  . End stage renal disease 07/21/2013  . ESRD on dialysis 06/14/2013  . Generalized weakness 06/14/2013  . Peripheral edema 05/26/2013  . Hyperkalemia 05/26/2013  . Anemia of chronic renal failure 05/26/2013  . Hyperlipidemia 07/14/2012  . Gout 07/14/2012  . Diabetes mellitus with complication 97/98/9211  . Essential hypertension, benign 12/15/2011  . Obesity 12/15/2011  . Prostate cancer 12/15/2011    Ailene Ravel, OTR/L,CBIS  [phone]  03/14/2014, 2:25 PM  [place] [address] [place], [place], [address] Phone: [phone]   Fax:  [phone]

## 2014-03-15 ENCOUNTER — Ambulatory Visit
Admission: RE | Admit: 2014-03-15 | Discharge: 2014-03-15 | Disposition: A | Discharge status: Home / Self Care | Payer: BLUE CROSS/BLUE SHIELD | Source: Ambulatory Visit | Attending: Radiation Oncology | Admitting: Radiation Oncology

## 2014-03-15 ENCOUNTER — Encounter (HOSPITAL_COMMUNITY): Payer: Self-pay | Admitting: Emergency Medicine

## 2014-03-15 ENCOUNTER — Emergency Department (HOSPITAL_COMMUNITY)
Admission: EM | Admit: 2014-03-15 | Discharge: 2014-03-15 | Discharge status: Against Medical Advice | Payer: BLUE CROSS/BLUE SHIELD | Attending: Emergency Medicine | Admitting: Emergency Medicine

## 2014-03-15 DIAGNOSIS — N186 End stage renal disease: Secondary | ICD-10-CM | POA: Diagnosis not present

## 2014-03-15 DIAGNOSIS — Z510 Encounter for antineoplastic radiation therapy: Secondary | ICD-10-CM | POA: Diagnosis not present

## 2014-03-15 DIAGNOSIS — I120 Hypertensive chronic kidney disease with stage 5 chronic kidney disease or end stage renal disease: Secondary | ICD-10-CM | POA: Diagnosis not present

## 2014-03-15 DIAGNOSIS — E119 Type 2 diabetes mellitus without complications: Secondary | ICD-10-CM | POA: Insufficient documentation

## 2014-03-15 DIAGNOSIS — K5900 Constipation, unspecified: Secondary | ICD-10-CM | POA: Diagnosis not present

## 2014-03-15 DIAGNOSIS — Z992 Dependence on renal dialysis: Secondary | ICD-10-CM | POA: Diagnosis not present

## 2014-03-15 DIAGNOSIS — C61 Malignant neoplasm of prostate: Secondary | ICD-10-CM

## 2014-03-15 NOTE — ED Notes (Signed)
Having constipation for last 4 days.  Last BM 4 days ago, last BM hard.  Have taken OTC meds with mild relief.

## 2014-03-15 NOTE — ED Notes (Signed)
Pt to registration window, states he has to leave to make a 1500 radiation appointment in [place], [place]. Pt has left at this time.

## 2014-03-15 NOTE — Progress Notes (Addendum)
Bob Maldonado in nursing to day for assessment related to constipation.  He reports that he has "major" difficulty evacuating stool and reports minimal relief with Senokot.  He is on dialysis and has a limitation in his fluid intake.  He c/o of rectal soreness and hemmorrhoids presently.  Seeking relief.  Seen by Dr. Valere Dross.

## 2014-03-15 NOTE — Progress Notes (Signed)
Weekly Management Note:  Site: Prostate Current Dose:  3780  cGy Projected Dose: 4500  cGy  Narrative: The patient is seen today for routine under treatment assessment. CBCT/MVCT images/port films were reviewed. The chart was reviewed.   He seen today complaining of a faculty evacuating his stools.  He is been on Senokot S for the past 3-4 weeks.  His fluid intake is restricted.  He is having hemorrhoidal discomfort as well.  He has tried enemas for evacuation of his stools.  Physical Examination: There were no vitals filed for this visit..  Weight:  .  On rectal examination today the rectal vault is pain in with soft stool.  Impression: I do not feel that the patient is impacted, , and I wonder if he would benefit from a laxative such as mineral oil.  I asked him to check with his nephrologist.  Plan: Continue radiation therapy as planned.

## 2014-03-16 ENCOUNTER — Ambulatory Visit
Admission: RE | Admit: 2014-03-16 | Discharge: 2014-03-16 | Disposition: A | Discharge status: Home / Self Care | Payer: BLUE CROSS/BLUE SHIELD | Source: Ambulatory Visit | Attending: Radiation Oncology | Admitting: Radiation Oncology

## 2014-03-16 ENCOUNTER — Ambulatory Visit (HOSPITAL_COMMUNITY)
Admission: RE | Admit: 2014-03-16 | Discharge: 2014-03-16 | Disposition: A | Discharge status: Home / Self Care | Payer: BLUE CROSS/BLUE SHIELD | Source: Ambulatory Visit | Attending: Orthopedic Surgery | Admitting: Orthopedic Surgery

## 2014-03-16 DIAGNOSIS — M25619 Stiffness of unspecified shoulder, not elsewhere classified: Secondary | ICD-10-CM

## 2014-03-16 DIAGNOSIS — R29898 Other symptoms and signs involving the musculoskeletal system: Secondary | ICD-10-CM

## 2014-03-16 DIAGNOSIS — M25519 Pain in unspecified shoulder: Secondary | ICD-10-CM

## 2014-03-16 DIAGNOSIS — Z5189 Encounter for other specified aftercare: Secondary | ICD-10-CM | POA: Diagnosis not present

## 2014-03-16 DIAGNOSIS — Z510 Encounter for antineoplastic radiation therapy: Secondary | ICD-10-CM | POA: Diagnosis not present

## 2014-03-16 NOTE — Patient Instructions (Signed)
ROM: Abduction (Standing)   Bring arms straight out from sides and raise as high as possible without pain. Repeat _12-15___ times per set. Do __1__ sets per session. Do __2__ sessions per day.    Extension (Active) ROM: Extension (Standing)   Bring arms straight back as far as possible without pain. Repeat _12-15___ times per set. Do __1__ sets per session. Do __2__ sessions per day.    ROM: External / Internal Rotation - in Abduction (Standing)   With upper arms parallel to floor and elbows bent at right angles, gently rotate arms up then down as far as possible without pain. Repeat _12-15___ times per set. Do __1__ sets per session. Do __2__ sessions per day.     Flexors Stretch (Active)   Stand, arms straight at sides. Bring arms straight forward and upward as high as possible without pain.  Repeat _12-15__ times per session. Do __2_ sessions per day.    Scapular Retraction (Standing)   With arms at sides, pinch shoulder blades together. Repeat _12-15___ times per set. Do __1__ sets per session. Do __2__ sessions per day.

## 2014-03-16 NOTE — Therapy (Signed)
[facility] [address] [place], [place], [address] Phone: [phone]   Fax:  [phone]  Occupational Therapy Reassessment and Treatment  Patient Details  Name: Bob Maldonado MRN: [id number] Date of Birth: 1955/04/19 Referring Provider:  Carole Civil, MD  Encounter Date: 03/16/2014      OT End of Session - 03/16/14 1506    Visit Number 28   Number of Visits 40   Date for OT Re-Evaluation 04/16/14   Authorization Type BCBS   Authorization Time Period n/a   OT Start Time 1300   OT Stop Time 1350   OT Time Calculation (min) 50 min   Activity Tolerance Patient tolerated treatment well   Behavior During Therapy [facility] for tasks assessed/performed      Past Medical History  Diagnosis Date  . Diabetes mellitus without complication   . Hypertension   . Hyperlipidemia   . Gout   . Cancer     prostate / Declines treatment  . Chronic kidney disease     ESRD- Declines Dialysis  . Renal insufficiency   . Prostate cancer 03/16/11, 08/29/13    Gleason 7, volume 55 mL  . Dialysis patient   . Arthritis     Past Surgical History  Procedure Laterality Date  . Right knee  1985  . Insertion of dialysis catheter Right 05/30/2013    Procedure: INSERTION OF DIALYSIS CATHETER;  Surgeon: Conrad White Salmon, MD;  Location: [place];  Service: Vascular;  Laterality: Right;  . Av fistula placement Left 05/30/2013    Procedure: Brachial vein transposition 1st stage;  Surgeon: Conrad Carters[place], MD;  Location: [place];  Service: Vascular;  Laterality: Left;  . Colonoscopy    . Bascilic vein transposition Left 07/25/2013    Procedure: SECOND STAGE BRACHIAL VEIN TRANSPOSITION;  Surgeon: Conrad Westport, MD;  Location: [place];  Service: Vascular;  Laterality: Left;  . Prostate biopsy  02/2011    Gleason 7  . Prostate biopsy  08/29/13    Gleason 7, volume 55 mL  . Ct radiation therapy guide      There were no vitals taken for this visit.  Visit Diagnosis:   Pain in joint, shoulder region, unspecified laterality  Decreased range of motion of shoulder, unspecified laterality  Shoulder weakness      Subjective Assessment - 03/16/14 1420    Symptoms S: I do most of everything with my right arm ok. My left arm I can't do as much.    Currently in Pain? No/denies          [facility] OT Assessment - 03/16/14 1324    Assessment   Diagnosis Bilateral Rotator Cuff syndrome   Precautions   Precautions None   PROM   Overall PROM Comments Assessed in supine, with ER/IR adducted (initial measurements 12/9   Right Shoulder Flexion 160 Degrees  last progress note: 150   Right Shoulder ABduction 160 Degrees  last progress note: 156   Right Shoulder Internal Rotation 90 Degrees  same at last progress note   Right Shoulder External Rotation 70 Degrees  last progress note: 61   Left Shoulder Flexion 120 Degrees  last progress note: 111   Left Shoulder ABduction 105 Degrees  last progress note: 105   Left Shoulder Internal Rotation 90 Degrees  last progress note: 90   Left Shoulder External Rotation 60 Degrees  last progress note: 51   Strength   Overall Strength Comments assessed seated. IR/ER adducted  Right Shoulder Flexion 5/5   Right Shoulder ABduction 5/5   Right Shoulder Internal Rotation 5/5   Right Shoulder External Rotation 5/5   Left Shoulder Flexion 4+/5  same at last progress note.   Left Shoulder ABduction 5/5  last progress note: 4+/5   Left Shoulder Internal Rotation --  4+/5 (last progress note: 4/5)   Left Shoulder External Rotation 5/5  last progress note: 4+/5                       OT Education - 03/16/14 1505    Education provided Yes   Education Details AROM standing and strengthening supine exercises   Person(s) Educated Patient   Methods Explanation;Demonstration;Handout   Comprehension Verbalized understanding          OT Short Term Goals - 03/16/14 1337    OT SHORT TERM GOAL #1   Title  Pt will be educated on HEP   OT [place] #2   Title Pt will improve BUE shoulder PROM to [facility] in working towards improved abilities donning a shirt   OT SHORT TERM GOAL #3   Title Pt will decrease fascial restrictions to min-mod in BUE for overall decreased pain   OT SHORT TERM GOAL #4   Title Pt will increase strength to grossly 4/5 in BUE for improved ability to hold onto bike handlebars   OT SHORT TERM GOAL #5   Title Pt's will decrease pain to less than 4/10 during daily activites   Status Achieved           OT Long Term Goals - 03/16/14 1337    OT LONG TERM GOAL #1   Title Pt will acheive highest level of functioning in all ADL, IADL, work, and leisure activies.   Status On-going   OT LONG TERM GOAL #2   Title Pt will achieve BUE shoulder AROM WFL for improved ability to play a round of golf   Status Achieved   OT LONG TERM GOAL #3   Title Pt will decrease fascial restrictions in BUE to minimal or less for decreased overall pain   Status Achieved   OT LONG TERM GOAL #4   Title Pt will improve strength in BUE to 5/5 for improved ability to engage in overhead reaching tasks   Status Partially Met   OT LONG TERM GOAL #5   Title Pt will have pain in BUE of less than 2/10 when donning a coat   Status On-going               Plan - 03/16/14 1507    Clinical Impression Statement A: Reassessment completed this date. Patient met all short term goals and 3/5 long term goals. Patient is functioning very well with RUE. Main deficits are in LUE. Patient continues to experience a pain level of 3 to 4/10 in LUE during daily activities such as donning his coat. Patient wishes to continue therapy for 4 more weeks to focus on LUE to increase ROM and strength. HEP was upadated this session.    OT Frequency 3x / week   OT Duration 4 weeks   Plan P: Attempt theraband and add to HEP if appropriate. Add ball on the wall.   Consulted and Agree with Plan of Care Patient         Problem List Patient Active Problem List   Diagnosis Date Noted  . Rotator cuff syndrome of both shoulders 12/25/2013  . End stage renal  disease 07/21/2013  . ESRD on dialysis 06/14/2013  . Generalized weakness 06/14/2013  . Peripheral edema 05/26/2013  . Hyperkalemia 05/26/2013  . Anemia of chronic renal failure 05/26/2013  . Hyperlipidemia 07/14/2012  . Gout 07/14/2012  . Diabetes mellitus with complication 19/50/9326  . Essential hypertension, benign 12/15/2011  . Obesity 12/15/2011  . Prostate cancer 12/15/2011    Ailene Ravel, OTR/L,CBIS  [phone]  03/16/2014, 3:12 PM  [facility] [address] [place], [place], [address] Phone: [phone]   Fax:  [phone]

## 2014-03-19 ENCOUNTER — Ambulatory Visit
Admission: RE | Admit: 2014-03-19 | Discharge: 2014-03-19 | Disposition: A | Discharge status: Home / Self Care | Payer: BLUE CROSS/BLUE SHIELD | Source: Ambulatory Visit | Attending: Radiation Oncology | Admitting: Radiation Oncology

## 2014-03-19 VITALS — BP 126/56 | HR 56 | Temp 98.4°F | Wt 224.3 lb

## 2014-03-19 DIAGNOSIS — C61 Malignant neoplasm of prostate: Secondary | ICD-10-CM

## 2014-03-19 DIAGNOSIS — Z510 Encounter for antineoplastic radiation therapy: Secondary | ICD-10-CM | POA: Diagnosis not present

## 2014-03-19 NOTE — Progress Notes (Signed)
Weekly assessment of radiation to prostate.Completed 23 of 25 treatments.Denies pain.Has slow stream and bouts of constipation.Started on miralax a few days ago and did have bowel movement today.Scheduled for prostate implant on 04/05/14.

## 2014-03-19 NOTE — Progress Notes (Signed)
Weekly Management Note:  Site: Prostate Current Dose:  4140  cGy Projected Dose: 4500  cGy  Narrative: The patient is seen today for routine under treatment assessment. CBCT/MVCT images/port films were reviewed. The chart was reviewed.   Bladder filling is good today.  No new GU or GI difficulties.  His constipation is improved on MiraLAX.  Bowel movement earlier today.  He is scheduled for seed implant on February 18.  Physical Examination:  Filed Vitals:   03/19/14 1614  BP: 126/56  Pulse: 56  Temp: 98.4 F (36.9 C)  .  Weight: 224 lb 4.8 oz (101.742 kg).  No change.  Impression: Tolerating radiation therapy well.  He will finish this Wednesday.  Plan: Continue radiation therapy as planned.

## 2014-03-20 ENCOUNTER — Ambulatory Visit
Admission: RE | Admit: 2014-03-20 | Discharge: 2014-03-20 | Disposition: A | Discharge status: Home / Self Care | Payer: BLUE CROSS/BLUE SHIELD | Source: Ambulatory Visit | Attending: Radiation Oncology | Admitting: Radiation Oncology

## 2014-03-20 ENCOUNTER — Ambulatory Visit: Payer: BLUE CROSS/BLUE SHIELD

## 2014-03-20 DIAGNOSIS — Z510 Encounter for antineoplastic radiation therapy: Secondary | ICD-10-CM | POA: Diagnosis not present

## 2014-03-21 ENCOUNTER — Ambulatory Visit (HOSPITAL_COMMUNITY)
Admission: RE | Admit: 2014-03-21 | Discharge: 2014-03-21 | Disposition: A | Discharge status: Home / Self Care | Payer: BLUE CROSS/BLUE SHIELD | Source: Ambulatory Visit | Attending: Orthopedic Surgery | Admitting: Orthopedic Surgery

## 2014-03-21 ENCOUNTER — Ambulatory Visit
Admission: RE | Admit: 2014-03-21 | Discharge: 2014-03-21 | Disposition: A | Discharge status: Home / Self Care | Payer: BLUE CROSS/BLUE SHIELD | Source: Ambulatory Visit | Attending: Radiation Oncology | Admitting: Radiation Oncology

## 2014-03-21 ENCOUNTER — Ambulatory Visit: Payer: BLUE CROSS/BLUE SHIELD

## 2014-03-21 ENCOUNTER — Encounter (HOSPITAL_COMMUNITY): Payer: Self-pay

## 2014-03-21 DIAGNOSIS — M25612 Stiffness of left shoulder, not elsewhere classified: Secondary | ICD-10-CM | POA: Diagnosis not present

## 2014-03-21 DIAGNOSIS — M25619 Stiffness of unspecified shoulder, not elsewhere classified: Secondary | ICD-10-CM

## 2014-03-21 DIAGNOSIS — M25611 Stiffness of right shoulder, not elsewhere classified: Secondary | ICD-10-CM | POA: Diagnosis not present

## 2014-03-21 DIAGNOSIS — R29898 Other symptoms and signs involving the musculoskeletal system: Secondary | ICD-10-CM

## 2014-03-21 DIAGNOSIS — Z510 Encounter for antineoplastic radiation therapy: Secondary | ICD-10-CM | POA: Diagnosis not present

## 2014-03-21 DIAGNOSIS — M25512 Pain in left shoulder: Secondary | ICD-10-CM | POA: Insufficient documentation

## 2014-03-21 DIAGNOSIS — M6281 Muscle weakness (generalized): Secondary | ICD-10-CM | POA: Diagnosis not present

## 2014-03-21 DIAGNOSIS — M25511 Pain in right shoulder: Secondary | ICD-10-CM | POA: Insufficient documentation

## 2014-03-21 DIAGNOSIS — Z5189 Encounter for other specified aftercare: Secondary | ICD-10-CM | POA: Diagnosis not present

## 2014-03-21 DIAGNOSIS — M25519 Pain in unspecified shoulder: Secondary | ICD-10-CM

## 2014-03-21 NOTE — Therapy (Signed)
[place], [place], [address] Phone: [phone]   Fax:  [phone]  Occupational Therapy Treatment  Patient Details  Name: Bob Maldonado MRN: [id number] Date of Birth: [birth year] Referring Provider:  Carole Civil, MD  Encounter Date: 03/21/2014      OT End of Session - 03/21/14 1234    Visit Number 29   Number of Visits 40   Date for OT Re-Evaluation 04/16/14   Authorization Type BCBS   Authorization Time Period n/a   OT Start Time 1147   OT Stop Time 1230   OT Time Calculation (min) 43 min   Activity Tolerance Patient tolerated treatment well   Behavior During Therapy [facility] for tasks assessed/performed      Past Medical History  Diagnosis Date  . Diabetes mellitus without complication   . Hypertension   . Hyperlipidemia   . Gout   . Cancer     prostate / Declines treatment  . Chronic kidney disease     ESRD- Declines Dialysis  . Renal insufficiency   . Prostate cancer 03/16/11, 08/29/13    Gleason 7, volume 55 mL  . Dialysis patient   . Arthritis     Past Surgical History  Procedure Laterality Date  . Right knee  1985  . Insertion of dialysis catheter Right 05/30/2013    Procedure: INSERTION OF DIALYSIS CATHETER;  Surgeon: Conrad [place], MD;  Location: [place];  Service: Vascular;  Laterality: Right;  . Av fistula placement Left 05/30/2013    Procedure: Brachial vein transposition 1st stage;  Surgeon: Conrad Diagonal, MD;  Location: [place];  Service: Vascular;  Laterality: Left;  . Colonoscopy    . Bascilic vein transposition Left 07/25/2013    Procedure: SECOND STAGE BRACHIAL VEIN TRANSPOSITION;  Surgeon: Conrad [place], MD;  Location: [place];  Service: Vascular;  Laterality: Left;  . Prostate biopsy  02/2011    Gleason 7  . Prostate biopsy  08/29/13    Gleason 7, volume 55 mL  . Ct radiation therapy guide      There were no vitals taken for this visit.  Visit Diagnosis:  Pain in joint,  shoulder region, unspecified laterality  Decreased range of motion of shoulder, unspecified laterality  Shoulder weakness      Subjective Assessment - 03/21/14 1212    Symptoms S: The weather is bad today so I can tell. No passive pain.    Currently in Pain? No/denies          [facility] OT Assessment - 03/21/14 1213    Assessment   Diagnosis Bilateral Rotator Cuff syndrome   Precautions   Precautions None               OT Treatments/Exercises (OP) - 03/21/14 1214    Exercises   Exercises Shoulder   Shoulder Exercises: Supine   Protraction Strengthening;20 reps;Both   Protraction Weight (lbs) 3   Horizontal ABduction Strengthening;20 reps;Both   Horizontal ABduction Weight (lbs) 3   External Rotation Strengthening;20 reps;Both   External Rotation Weight (lbs) 3   Internal Rotation Strengthening;20 reps;Both   Internal Rotation Weight (lbs) 3   Flexion Strengthening;20 reps;Both   Shoulder Flexion Weight (lbs) 3   ABduction Strengthening;20 reps;Both   Shoulder ABduction Weight (lbs) 3   Shoulder Exercises: Standing   Protraction Strengthening;12 reps   Protraction Weight (lbs) 1   Horizontal ABduction Strengthening;12 reps   Horizontal ABduction Weight (lbs) 1  External Rotation Strengthening;12 reps   External Rotation Weight (lbs) 1   Internal Rotation Strengthening;12 reps   Internal Rotation Weight (lbs) 1   Flexion Strengthening;12 reps   Shoulder Flexion Weight (lbs) 1   ABduction Strengthening;12 reps   Shoulder ABduction Weight (lbs) 1   Shoulder Exercises: ROM/Strengthening   Cybex Press 20 reps  4 plate   Cybex Row 20 reps  4 plate   Proximal Shoulder Strengthening, Supine 20X with 3#   Manual Therapy   Manual Therapy Myofascial release   Myofascial Release muscle energy technique to left anterior deltoid to relax tone and muscle spasm and improve range of motion.                  OT Short Term Goals - 03/21/14 1236    OT SHORT  TERM GOAL #1   Title Pt will be educated on HEP   OT [place] #2   Title Pt will improve BUE shoulder PROM to [facility] in working towards improved abilities donning a shirt   OT SHORT TERM GOAL #3   Title Pt will decrease fascial restrictions to min-mod in BUE for overall decreased pain   OT SHORT TERM GOAL #4   Title Pt will increase strength to grossly 4/5 in BUE for improved ability to hold onto bike handlebars   OT SHORT TERM GOAL #5   Title Pt's will decrease pain to less than 4/10 during daily activites           OT Long Term Goals - 03/21/14 1236    OT LONG TERM GOAL #1   Title Pt will acheive highest level of functioning in all ADL, IADL, work, and leisure activies.   Status On-going   OT LONG TERM GOAL #2   Title Pt will achieve BUE shoulder AROM WFL for improved ability to play a round of golf   OT LONG TERM GOAL #3   Title Pt will decrease fascial restrictions in BUE to minimal or less for decreased overall pain   OT LONG TERM GOAL #4   Title Pt will improve strength in BUE to 5/5 for improved ability to engage in overhead reaching tasks   Status Partially Met   OT LONG TERM GOAL #5   Title Pt will have pain in BUE of less than 2/10 when donning a coat   Status On-going               Plan - 03/21/14 1235    Clinical Impression Statement A: Added 1# to standing exercises this date. Patient tolerated well. Had some difficulty achieving full range during shoulder flexion and abduction. Trigger point pain palpated along lateral portion of left arm.    Plan P: Attempt theraband and add to HEP if appropriate. Add ball on the wall.         Problem List Patient Active Problem List   Diagnosis Date Noted  . Rotator cuff syndrome of both shoulders 12/25/2013  . End stage renal disease 07/21/2013  . ESRD on dialysis 06/14/2013  . Generalized weakness 06/14/2013  . Peripheral edema 05/26/2013  . Hyperkalemia 05/26/2013  . Anemia of chronic renal failure  05/26/2013  . Hyperlipidemia 07/14/2012  . Gout 07/14/2012  . Diabetes mellitus with complication 67/01/4579  . Essential hypertension, benign 12/15/2011  . Obesity 12/15/2011  . Prostate cancer 12/15/2011    Ailene Ravel, OTR/L,CBIS  [phone]  03/21/2014, 12:37 PM  [facility]  [place], [place], [address] Phone: [phone]   Fax:  [phone]

## 2014-03-22 ENCOUNTER — Ambulatory Visit: Payer: BLUE CROSS/BLUE SHIELD

## 2014-03-22 ENCOUNTER — Encounter: Payer: Self-pay | Admitting: Radiation Oncology

## 2014-03-22 NOTE — Progress Notes (Signed)
[facility]  Treatment Summary Note  Name:Bob Maldonado  Date: 03/22/2014 [id number] DOB:02-06-1956   Status:outpatient    CC: Bob Blackbird, MD  Dr. Franchot Gallo  REFERRING PHYSICIAN:  Dr. 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 tolerated his treatment well and there were days when he had reasonable bladder filling despite his oliguria.  He has some difficulty with constipation which improved with MiraLAX.                        PLAN: He is scheduled for his prostate seed implant boost with Dr. Diona Fanti on 04/05/2014.

## 2014-03-23 ENCOUNTER — Ambulatory Visit (HOSPITAL_COMMUNITY)
Admission: RE | Admit: 2014-03-23 | Discharge: 2014-03-23 | Disposition: A | Discharge status: Home / Self Care | Payer: BLUE CROSS/BLUE SHIELD | Source: Ambulatory Visit | Attending: Orthopedic Surgery | Admitting: Orthopedic Surgery

## 2014-03-23 ENCOUNTER — Encounter (HOSPITAL_COMMUNITY): Payer: Self-pay

## 2014-03-23 DIAGNOSIS — Z5189 Encounter for other specified aftercare: Secondary | ICD-10-CM | POA: Diagnosis not present

## 2014-03-23 DIAGNOSIS — M25519 Pain in unspecified shoulder: Secondary | ICD-10-CM

## 2014-03-23 DIAGNOSIS — R29898 Other symptoms and signs involving the musculoskeletal system: Secondary | ICD-10-CM

## 2014-03-23 DIAGNOSIS — M25619 Stiffness of unspecified shoulder, not elsewhere classified: Secondary | ICD-10-CM

## 2014-03-23 NOTE — Therapy (Signed)
[facility], [place], [address] Phone: [phone]   Fax:  [phone]  Occupational Therapy Treatment  Patient Details  Name: Bob Maldonado MRN: [id number] Date of Birth: 07-03-1955 Referring Provider:  Carole Civil, MD  Encounter Date: 03/23/2014      OT End of Session - 03/23/14 1233    Visit Number 30   Number of Visits 40   Date for OT Re-Evaluation 04/16/14   Authorization Type BCBS   Authorization Time Period n/a   OT Start Time 1149   OT Stop Time 1230   OT Time Calculation (min) 41 min   Activity Tolerance Patient tolerated treatment well   Behavior During Therapy [facility] for tasks assessed/performed      Past Medical History  Diagnosis Date  . Diabetes mellitus without complication   . Hypertension   . Hyperlipidemia   . Gout   . Cancer     prostate / Declines treatment  . Chronic kidney disease     ESRD- Declines Dialysis  . Renal insufficiency   . Prostate cancer 03/16/11, 08/29/13    Gleason 7, volume 55 mL  . Dialysis patient   . Arthritis     Past Surgical History  Procedure Laterality Date  . Right knee  1985  . Insertion of dialysis catheter Right 05/30/2013    Procedure: INSERTION OF DIALYSIS CATHETER;  Surgeon: Conrad [place]ans, MD;  Location: [place];  Service: Vascular;  Laterality: Right;  . Av fistula placement Left 05/30/2013    Procedure: Brachial vein transposition 1st stage;  Surgeon: Conrad Herbst, MD;  Location: [place];  Service: Vascular;  Laterality: Left;  . Colonoscopy    . Bascilic vein transposition Left 07/25/2013    Procedure: SECOND STAGE BRACHIAL VEIN TRANSPOSITION;  Surgeon: Conrad [place], MD;  Location: [place];  Service: Vascular;  Laterality: Left;  . Prostate biopsy  02/2011    Gleason 7  . Prostate biopsy  08/29/13    Gleason 7, volume 55 mL  . Ct radiation therapy guide      There were no vitals taken for this visit.  Visit Diagnosis:  Pain in joint,  shoulder region, unspecified laterality  Decreased range of motion of shoulder, unspecified laterality  Shoulder weakness      Subjective Assessment - 03/23/14 1208    Symptoms S: My arm hurts going that way (ER). It's probably because I can't move my arm for 4 hours in dialysis.   Currently in Pain? No/denies          [facility] OT Assessment - 03/23/14 1210    Assessment   Diagnosis Bilateral Rotator Cuff syndrome   Precautions   Precautions None               OT Treatments/Exercises (OP) - 03/23/14 1210    Shoulder Exercises: Supine   Protraction Strengthening;20 reps;Both   Protraction Weight (lbs) 3   Horizontal ABduction Strengthening;20 reps;Both   Horizontal ABduction Weight (lbs) 3   External Rotation Strengthening;20 reps;Both   External Rotation Weight (lbs) 3   Internal Rotation Strengthening;20 reps;Both   Internal Rotation Weight (lbs) 3   Flexion Strengthening;20 reps;Both   Shoulder Flexion Weight (lbs) 3   ABduction Strengthening;20 reps;Both   Shoulder ABduction Weight (lbs) 3   Shoulder Exercises: Standing   Protraction Strengthening;12 reps   Protraction Weight (lbs) 1   Horizontal ABduction Strengthening;12 reps   Horizontal ABduction Weight (lbs) 1  External Rotation Strengthening;12 reps   External Rotation Weight (lbs) 1   Internal Rotation Strengthening;12 reps   Internal Rotation Weight (lbs) 1   Flexion Strengthening;12 reps   Shoulder Flexion Weight (lbs) 1   ABduction Strengthening;12 reps   Shoulder ABduction Weight (lbs) 1   Extension Theraband;15 reps   Theraband Level (Shoulder Extension) Level 3 (Green)   Row [employer] reps   Theraband Level (Shoulder Row) Level 3 (Green)   Retraction Theraband;15 reps   Theraband Level (Shoulder Retraction) Level 3 (Green)   Shoulder Exercises: ROM/Strengthening   Cybex Press 20 reps  4 plate   Cybex Row 20 reps  4 plate   Proximal Shoulder Strengthening, Supine 20X with 3#    Manual Therapy   Manual Therapy Myofascial release   Myofascial Release muscle energy technique to left anterior deltoid to relax tone and muscle spasm and improve range of motion                OT Education - 03/23/14 1232    Education provided Yes   Education Details green theraband exercises   Person(s) Educated Patient   Methods Explanation;Demonstration;Handout   Comprehension Verbalized understanding          OT Short Term Goals - 03/21/14 1236    OT SHORT TERM GOAL #1   Title Pt will be educated on HEP   OT [place] #2   Title Pt will improve BUE shoulder PROM to [facility] in working towards improved abilities donning a shirt   OT SHORT TERM GOAL #3   Title Pt will decrease fascial restrictions to min-mod in BUE for overall decreased pain   OT SHORT TERM GOAL #4   Title Pt will increase strength to grossly 4/5 in BUE for improved ability to hold onto bike handlebars   OT SHORT TERM GOAL #5   Title Pt's will decrease pain to less than 4/10 during daily activites           OT Long Term Goals - 03/21/14 1236    OT LONG TERM GOAL #1   Title Pt will acheive highest level of functioning in all ADL, IADL, work, and leisure activies.   Status On-going   OT LONG TERM GOAL #2   Title Pt will achieve BUE shoulder AROM WFL for improved ability to play a round of golf   OT LONG TERM GOAL #3   Title Pt will decrease fascial restrictions in BUE to minimal or less for decreased overall pain   OT LONG TERM GOAL #4   Title Pt will improve strength in BUE to 5/5 for improved ability to engage in overhead reaching tasks   Status Partially Met   OT LONG TERM GOAL #5   Title Pt will have pain in BUE of less than 2/10 when donning a coat   Status On-going               Plan - 03/23/14 1233    Clinical Impression Statement A: Added green theraband to HEP. Patient was also educated to use a water bottle to complete strengthening exercises at home and to increase the  reps.    Plan P: Add prone exercises with 3# handweight.         Problem List Patient Active Problem List   Diagnosis Date Noted  . Rotator cuff syndrome of both shoulders 12/25/2013  . End stage renal disease 07/21/2013  . ESRD on dialysis 06/14/2013  . Generalized weakness 06/14/2013  .  Peripheral edema 05/26/2013  . Hyperkalemia 05/26/2013  . Anemia of chronic renal failure 05/26/2013  . Hyperlipidemia 07/14/2012  . Gout 07/14/2012  . Diabetes mellitus with complication 29/47/6546  . Essential hypertension, benign 12/15/2011  . Obesity 12/15/2011  . Prostate cancer 12/15/2011    Ailene Ravel, OTR/L,CBIS  [phone]  03/23/2014, 12:41 PM  [facility] [address] [place], [place], [address] Phone: [phone]   Fax:  [phone]

## 2014-03-23 NOTE — Patient Instructions (Signed)
(  Home) Extension: Isometric / Bilateral Arm Retraction - Sitting   Facing anchor, hold hands and elbow at shoulder height, with elbow bent.  Pull arms back to squeeze shoulder blades together. Repeat 10-15 times.    (Home) Retraction: Row - Bilateral (Anchor)   Facing anchor, arms reaching forward, pull hands toward stomach, keeping elbows bent and at your sides and pinching shoulder blades together. Repeat 10-15 times.    (Clinic) Extension / Flexion (Assist)   Face anchor, pull arms back, keeping elbow straight, and squeze shoulder blades together. Repeat 10-15 times.

## 2014-03-26 ENCOUNTER — Ambulatory Visit (HOSPITAL_COMMUNITY)
Admission: RE | Admit: 2014-03-26 | Discharge: 2014-03-26 | Disposition: A | Discharge status: Home / Self Care | Payer: BLUE CROSS/BLUE SHIELD | Source: Ambulatory Visit | Attending: Orthopedic Surgery | Admitting: Orthopedic Surgery

## 2014-03-26 DIAGNOSIS — R29898 Other symptoms and signs involving the musculoskeletal system: Secondary | ICD-10-CM

## 2014-03-26 DIAGNOSIS — Z5189 Encounter for other specified aftercare: Secondary | ICD-10-CM | POA: Diagnosis not present

## 2014-03-26 DIAGNOSIS — M25619 Stiffness of unspecified shoulder, not elsewhere classified: Secondary | ICD-10-CM

## 2014-03-26 DIAGNOSIS — M25519 Pain in unspecified shoulder: Secondary | ICD-10-CM

## 2014-03-26 NOTE — Therapy (Signed)
[place], [place], [address] Phone: [phone]   Fax:  [phone]  Occupational Therapy Treatment  Patient Details  Name: Bob Maldonado MRN: [id number] Date of Birth: Jul 03, 1955 Referring Provider:  Carole Civil, MD  Encounter Date: 03/26/2014      OT End of Session - 03/26/14 1314    Visit Number 31   Number of Visits 40   Date for OT Re-Evaluation 04/16/14   Authorization Type BCBS   OT Start Time 1151   OT Stop Time 1236   OT Time Calculation (min) 45 min   Activity Tolerance Patient tolerated treatment well   Behavior During Therapy [facility] for tasks assessed/performed      Past Medical History  Diagnosis Date  . Diabetes mellitus without complication   . Hypertension   . Hyperlipidemia   . Gout   . Cancer     prostate / Declines treatment  . Chronic kidney disease     ESRD- Declines Dialysis  . Renal insufficiency   . Prostate cancer 03/16/11, 08/29/13    Gleason 7, volume 55 mL  . Dialysis patient   . Arthritis     Past Surgical History  Procedure Laterality Date  . Right knee  1985  . Insertion of dialysis catheter Right 05/30/2013    Procedure: INSERTION OF DIALYSIS CATHETER;  Surgeon: Conrad Spearman, MD;  Location: [place];  Service: Vascular;  Laterality: Right;  . Av fistula placement Left 05/30/2013    Procedure: Brachial vein transposition 1st stage;  Surgeon: Conrad [place], MD;  Location: [place];  Service: Vascular;  Laterality: Left;  . Colonoscopy    . Bascilic vein transposition Left 07/25/2013    Procedure: SECOND STAGE BRACHIAL VEIN TRANSPOSITION;  Surgeon: Conrad [place], MD;  Location: [place];  Service: Vascular;  Laterality: Left;  . Prostate biopsy  02/2011    Gleason 7  . Prostate biopsy  08/29/13    Gleason 7, volume 55 mL  . Ct radiation therapy guide      There were no vitals taken for this visit.  Visit Diagnosis:  Pain in joint, shoulder region, unspecified  laterality  Decreased range of motion of shoulder, unspecified laterality  Shoulder weakness      Subjective Assessment - 03/26/14 1154    Symptoms S:  My blood pressure tanked at dialysis, I am ok now.     Limitations Progress as tolerated   Currently in Pain? No/denies          [facility] OT Assessment - 03/26/14 0001    Assessment   Diagnosis Bilateral Rotator Cuff syndrome   Precautions   Precautions None               OT Treatments/Exercises (OP) - 03/26/14 0001    Exercises   Exercises Shoulder   Shoulder Exercises: Supine   Protraction Strengthening;20 reps;Both   Protraction Weight (lbs) 3   Horizontal ABduction Strengthening;20 reps;Both   Horizontal ABduction Weight (lbs) 3   External Rotation Strengthening;20 reps;Both   External Rotation Weight (lbs) 3   Internal Rotation Strengthening;20 reps;Both   Internal Rotation Weight (lbs) 3   Flexion Strengthening;20 reps;Both   Shoulder Flexion Weight (lbs) 3   ABduction Strengthening;20 reps;Both   Shoulder ABduction Weight (lbs) 3   Shoulder Exercises: Prone   Retraction Limitations;Strengthening  all prone completed standing with flexed trunk   Retraction Weight (lbs) 2   Extension Strengthening;10 reps   Extension  Weight (lbs) 2   External Rotation Strengthening;10 reps   External Rotation Weight (lbs) 2   Internal Rotation Strengthening;10 reps   Internal Rotation Weight (lbs) 2   Horizontal ABduction 1 Strengthening;10 reps   Horizontal ABduction 1 Weight (lbs) 2   Horizontal ABduction 2 Strengthening;10 reps   Horizontal ABduction 2 Weight (lbs) 2   Shoulder Exercises: ROM/Strengthening   Cybex Press 20 reps  4 plates   Cybex Row 20 reps  4 plates   "W" Arms modified position 3 repetitions, max difficulty   X to V Arms PNF D2 extension to D2 flexion with left arm only, patient had max difficulty completing and only had 50% range   Proximal Shoulder Strengthening, Supine 20X with 3#   Manual  Therapy   Manual Therapy Myofascial release   Myofascial Release Myofascial release and manual stretching to bilateral UE focusing on upper arm, trapezius and scapularis region.                   OT Short Term Goals - 03/21/14 1236    OT SHORT TERM GOAL #1   Title Pt will be educated on HEP   OT [place] #2   Title Pt will improve BUE shoulder PROM to [facility] in working towards improved abilities donning a shirt   OT SHORT TERM GOAL #3   Title Pt will decrease fascial restrictions to min-mod in BUE for overall decreased pain   OT SHORT TERM GOAL #4   Title Pt will increase strength to grossly 4/5 in BUE for improved ability to hold onto bike handlebars   OT SHORT TERM GOAL #5   Title Pt's will decrease pain to less than 4/10 during daily activites           OT Long Term Goals - 03/21/14 1236    OT LONG TERM GOAL #1   Title Pt will acheive highest level of functioning in all ADL, IADL, work, and leisure activies.   Status On-going   OT LONG TERM GOAL #2   Title Pt will achieve BUE shoulder AROM WFL for improved ability to play a round of golf   OT LONG TERM GOAL #3   Title Pt will decrease fascial restrictions in BUE to minimal or less for decreased overall pain   OT LONG TERM GOAL #4   Title Pt will improve strength in BUE to 5/5 for improved ability to engage in overhead reaching tasks   Status Partially Met   OT LONG TERM GOAL #5   Title Pt will have pain in BUE of less than 2/10 when donning a coat   Status On-going               Plan - 03/26/14 1314    Clinical Impression Statement A:  Began prone strengthening this date with 2#, stood and flexed at waist vs laying in prone per patient request. Attempted PNF D2 extension to D2 flexion standing with max difficulty.   Plan P:  Attempt PNF pattern D2 ext to D2 flexion in supine with therapist facilitating movement - in prep for golfing.         Problem List Patient Active Problem List   Diagnosis  Date Noted  . Rotator cuff syndrome of both shoulders 12/25/2013  . End stage renal disease 07/21/2013  . ESRD on dialysis 06/14/2013  . Generalized weakness 06/14/2013  . Peripheral edema 05/26/2013  . Hyperkalemia 05/26/2013  . Anemia of chronic renal failure 05/26/2013  .  Hyperlipidemia 07/14/2012  . Gout 07/14/2012  . Diabetes mellitus with complication 42/68/3419  . Essential hypertension, benign 12/15/2011  . Obesity 12/15/2011  . Prostate cancer 12/15/2011    Vangie Bicker, OTR/L [phone]  03/26/2014, 1:19 PM  [place] [address] [place], [place], [address] Phone: [phone]   Fax:  [phone]

## 2014-03-28 ENCOUNTER — Encounter (HOSPITAL_COMMUNITY): Payer: Self-pay

## 2014-03-28 ENCOUNTER — Telehealth: Payer: Self-pay | Admitting: *Deleted

## 2014-03-28 ENCOUNTER — Ambulatory Visit (HOSPITAL_COMMUNITY)
Admission: RE | Admit: 2014-03-28 | Discharge: 2014-03-28 | Disposition: A | Discharge status: Home / Self Care | Payer: BLUE CROSS/BLUE SHIELD | Source: Ambulatory Visit | Attending: Orthopedic Surgery | Admitting: Orthopedic Surgery

## 2014-03-28 DIAGNOSIS — M25519 Pain in unspecified shoulder: Secondary | ICD-10-CM

## 2014-03-28 DIAGNOSIS — R29898 Other symptoms and signs involving the musculoskeletal system: Secondary | ICD-10-CM

## 2014-03-28 DIAGNOSIS — M25619 Stiffness of unspecified shoulder, not elsewhere classified: Secondary | ICD-10-CM

## 2014-03-28 DIAGNOSIS — Z5189 Encounter for other specified aftercare: Secondary | ICD-10-CM | POA: Diagnosis not present

## 2014-03-28 NOTE — Telephone Encounter (Signed)
CALLED PATIENT TO REMIND OF LABS FOR 03-29-14 FOR THE PROCEDURE ON 04-05-14, LVM FOR A RETURN CALL

## 2014-03-28 NOTE — Therapy (Signed)
[place], [place], [address] Phone: [phone]   Fax:  [phone]  Occupational Therapy Treatment  Patient Details  Name: Bob Maldonado MRN: [id number] Date of Birth: [birth year] Referring Provider:  Carole Civil, MD  Encounter Date: 03/28/2014      OT End of Session - 03/28/14 1228    Visit Number 32   Number of Visits 40   Date for OT Re-Evaluation 04/16/14   Authorization Type BCBS   OT Start Time 1147   OT Stop Time 1233   OT Time Calculation (min) 46 min   Activity Tolerance Patient tolerated treatment well   Behavior During Therapy [facility] for tasks assessed/performed      Past Medical History  Diagnosis Date  . Diabetes mellitus without complication   . Hypertension   . Hyperlipidemia   . Gout   . Cancer     prostate / Declines treatment  . Chronic kidney disease     ESRD- Declines Dialysis  . Renal insufficiency   . Prostate cancer 03/16/11, 08/29/13    Gleason 7, volume 55 mL  . Dialysis patient   . Arthritis     Past Surgical History  Procedure Laterality Date  . Right knee  1985  . Insertion of dialysis catheter Right 05/30/2013    Procedure: INSERTION OF DIALYSIS CATHETER;  Surgeon: Conrad [place]en[place], MD;  Location: [place];  Service: Vascular;  Laterality: Right;  . Av fistula placement Left 05/30/2013    Procedure: Brachial vein transposition 1st stage;  Surgeon: Conrad [place], MD;  Location: [place];  Service: Vascular;  Laterality: Left;  . Colonoscopy    . Bascilic vein transposition Left 07/25/2013    Procedure: SECOND STAGE BRACHIAL VEIN TRANSPOSITION;  Surgeon: Conrad [place], MD;  Location: [place];  Service: Vascular;  Laterality: Left;  . Prostate biopsy  02/2011    Gleason 7  . Prostate biopsy  08/29/13    Gleason 7, volume 55 mL  . Ct radiation therapy guide      There were no vitals taken for this visit.  Visit Diagnosis:  Pain in joint, shoulder region, unspecified  laterality  Decreased range of motion of shoulder, unspecified laterality  Shoulder weakness      Subjective Assessment - 03/28/14 1210    Symptoms S: I feel like I get pretty good range at home.    Currently in Pain? Yes   Pain Score 5    Pain Location Shoulder   Pain Orientation Left   Pain Descriptors / Indicators Aching   Pain Type Chronic pain          [facility] OT Assessment - 03/28/14 1211    Assessment   Diagnosis Bilateral Rotator Cuff syndrome   Precautions   Precautions None               OT Treatments/Exercises (OP) - 03/28/14 1211    Shoulder Exercises: Supine   Protraction Strengthening;20 reps;Both   Protraction Weight (lbs) 3   Horizontal ABduction Strengthening;20 reps;Both   Horizontal ABduction Weight (lbs) 3   External Rotation Strengthening;20 reps;Both   External Rotation Weight (lbs) 3   Internal Rotation Strengthening;20 reps;Both   Internal Rotation Weight (lbs) 3   Flexion Strengthening;20 reps;Both   Shoulder Flexion Weight (lbs) 3   ABduction Strengthening;20 reps;Both   Shoulder ABduction Weight (lbs) 3   Shoulder Exercises: Prone   Retraction Limitations;Strengthening   Retraction Weight (lbs) 2  Retraction Limitations All prone exercises completed using counter for a flexed trunk   Extension Strengthening;10 reps   Extension Weight (lbs) 2   External Rotation Strengthening;10 reps   External Rotation Weight (lbs) 2   Internal Rotation Strengthening;10 reps   Internal Rotation Weight (lbs) 2   Horizontal ABduction 1 Strengthening;10 reps   Horizontal ABduction 1 Weight (lbs) 2   Horizontal ABduction 2 Strengthening;10 reps   Horizontal ABduction 2 Weight (lbs) 2   Shoulder Exercises: Standing   Extension Theraband;15 reps   Theraband Level (Shoulder Extension) Level 3 (Green)   Row [employer] reps   Theraband Level (Shoulder Row) Level 3 (Green)   Retraction Theraband;15 reps   Theraband Level (Shoulder Retraction)  Level 3 (Green)   Shoulder Exercises: ROM/Strengthening   Cybex Press 20 reps  4 plate   Cybex Row 20 reps  4 plate   X to V Arms PNF D2 extension to D2 flexion with left arm only, patient had max difficulty completing and only had 50% range. 10X   Proximal Shoulder Strengthening, Supine 20X with 3#   Manual Therapy   Manual Therapy Myofascial release   Myofascial Release muscle energy technique to left anterior deltoid to relax tone and muscle spasm and improve range of motion                  OT Short Term Goals - 03/21/14 1236    OT SHORT TERM GOAL #1   Title Pt will be educated on HEP   OT SHORT TERM GOAL #2   Title Pt will improve BUE shoulder PROM to [facility] in working towards improved abilities donning a shirt   OT SHORT TERM GOAL #3   Title Pt will decrease fascial restrictions to min-mod in BUE for overall decreased pain   OT SHORT TERM GOAL #4   Title Pt will increase strength to grossly 4/5 in BUE for improved ability to hold onto bike handlebars   OT SHORT TERM GOAL #5   Title Pt's will decrease pain to less than 4/10 during daily activites           OT Long Term Goals - 03/21/14 1236    OT LONG TERM GOAL #1   Title Pt will acheive highest level of functioning in all ADL, IADL, work, and leisure activies.   Status On-going   OT LONG TERM GOAL #2   Title Pt will achieve BUE shoulder AROM WFL for improved ability to play a round of golf   OT LONG TERM GOAL #3   Title Pt will decrease fascial restrictions in BUE to minimal or less for decreased overall pain   OT LONG TERM GOAL #4   Title Pt will improve strength in BUE to 5/5 for improved ability to engage in overhead reaching tasks   Status Partially Met   OT LONG TERM GOAL #5   Title Pt will have pain in BUE of less than 2/10 when donning a coat   Status On-going               Plan - 03/28/14 1243    Clinical Impression Statement A: Pt continues to have difficulty completing D2 flexion and  extension. Did not attempt to complete in supine as therapist misread plan. Will attempt next session.    Plan P: Attempt PNF pattern D2 ext to D2 flexion in supine with therapist facilitating movement - in prep for golfing.        Problem List Patient Active  Problem List   Diagnosis Date Noted  . Rotator cuff syndrome of both shoulders 12/25/2013  . End stage renal disease 07/21/2013  . ESRD on dialysis 06/14/2013  . Generalized weakness 06/14/2013  . Peripheral edema 05/26/2013  . Hyperkalemia 05/26/2013  . Anemia of chronic renal failure 05/26/2013  . Hyperlipidemia 07/14/2012  . Gout 07/14/2012  . Diabetes mellitus with complication 77/41/2878  . Essential hypertension, benign 12/15/2011  . Obesity 12/15/2011  . Prostate cancer 12/15/2011    Ailene Ravel, OTR/L,CBIS  [phone]  03/28/2014, 12:48 PM  [facility] [address] [place], [place], [address] Phone: [phone]   Fax:  [phone]

## 2014-03-29 ENCOUNTER — Encounter (HOSPITAL_BASED_OUTPATIENT_CLINIC_OR_DEPARTMENT_OTHER): Payer: Self-pay | Admitting: *Deleted

## 2014-03-29 DIAGNOSIS — N186 End stage renal disease: Secondary | ICD-10-CM | POA: Diagnosis not present

## 2014-03-29 DIAGNOSIS — E785 Hyperlipidemia, unspecified: Secondary | ICD-10-CM | POA: Diagnosis not present

## 2014-03-29 DIAGNOSIS — C61 Malignant neoplasm of prostate: Secondary | ICD-10-CM | POA: Diagnosis present

## 2014-03-29 DIAGNOSIS — I120 Hypertensive chronic kidney disease with stage 5 chronic kidney disease or end stage renal disease: Secondary | ICD-10-CM | POA: Diagnosis not present

## 2014-03-29 DIAGNOSIS — Z87891 Personal history of nicotine dependence: Secondary | ICD-10-CM | POA: Diagnosis not present

## 2014-03-29 DIAGNOSIS — Z992 Dependence on renal dialysis: Secondary | ICD-10-CM | POA: Diagnosis not present

## 2014-03-29 DIAGNOSIS — K219 Gastro-esophageal reflux disease without esophagitis: Secondary | ICD-10-CM | POA: Diagnosis not present

## 2014-03-29 DIAGNOSIS — E119 Type 2 diabetes mellitus without complications: Secondary | ICD-10-CM | POA: Diagnosis not present

## 2014-03-29 DIAGNOSIS — M109 Gout, unspecified: Secondary | ICD-10-CM | POA: Diagnosis not present

## 2014-03-29 DIAGNOSIS — Z9889 Other specified postprocedural states: Secondary | ICD-10-CM | POA: Diagnosis not present

## 2014-03-29 DIAGNOSIS — Z79899 Other long term (current) drug therapy: Secondary | ICD-10-CM | POA: Diagnosis not present

## 2014-03-29 LAB — COMPREHENSIVE METABOLIC PANEL
ALK PHOS: 73 U/L (ref 39–117)
ALT: 23 U/L (ref 0–53)
AST: 18 U/L (ref 0–37)
Albumin: 4.2 g/dL (ref 3.5–5.2)
Anion gap: 13 (ref 5–15)
BILIRUBIN TOTAL: 0.4 mg/dL (ref 0.3–1.2)
BUN: 59 mg/dL — AB (ref 6–23)
CO2: 32 mmol/L (ref 19–32)
CREATININE: 9.45 mg/dL — AB (ref 0.50–1.35)
Calcium: 9.8 mg/dL (ref 8.4–10.5)
Chloride: 96 mmol/L (ref 96–112)
GFR calc non Af Amer: 5 mL/min — ABNORMAL LOW (ref >90)
GFR, EST AFRICAN AMERICAN: 6 mL/min — AB (ref >90)
GLUCOSE: 116 mg/dL — AB (ref 70–99)
Potassium: 5.2 mmol/L — ABNORMAL HIGH (ref 3.5–5.1)
Sodium: 141 mmol/L (ref 135–145)
Total Protein: 7.8 g/dL (ref 6.0–8.3)

## 2014-03-29 LAB — CBC
HEMATOCRIT: 37.2 % — AB (ref 39.0–52.0)
Hemoglobin: 11.7 g/dL — ABNORMAL LOW (ref 13.0–17.0)
MCH: 29.7 pg (ref 26.0–34.0)
MCHC: 31.5 g/dL (ref 30.0–36.0)
MCV: 94.4 fL (ref 78.0–100.0)
Platelets: 209 10*3/uL (ref 150–400)
RBC: 3.94 MIL/uL — AB (ref 4.22–5.81)
RDW: 13.9 % (ref 11.5–15.5)
WBC: 5.7 10*3/uL (ref 4.0–10.5)

## 2014-03-29 LAB — APTT: aPTT: 35 seconds (ref 24–37)

## 2014-03-29 LAB — PROTIME-INR
INR: 0.98 (ref 0.00–1.49)
Prothrombin Time: 13.1 seconds (ref 11.6–15.2)

## 2014-03-30 ENCOUNTER — Ambulatory Visit (HOSPITAL_COMMUNITY)
Admission: RE | Admit: 2014-03-30 | Discharge: 2014-03-30 | Disposition: A | Discharge status: Home / Self Care | Payer: BLUE CROSS/BLUE SHIELD | Source: Ambulatory Visit | Attending: Orthopedic Surgery | Admitting: Orthopedic Surgery

## 2014-03-30 ENCOUNTER — Encounter (HOSPITAL_BASED_OUTPATIENT_CLINIC_OR_DEPARTMENT_OTHER): Payer: Self-pay | Admitting: *Deleted

## 2014-03-30 DIAGNOSIS — M25619 Stiffness of unspecified shoulder, not elsewhere classified: Secondary | ICD-10-CM

## 2014-03-30 DIAGNOSIS — Z5189 Encounter for other specified aftercare: Secondary | ICD-10-CM | POA: Diagnosis not present

## 2014-03-30 DIAGNOSIS — M25519 Pain in unspecified shoulder: Secondary | ICD-10-CM

## 2014-03-30 NOTE — Therapy (Signed)
[place], [place], [address] Phone: [phone]   Fax:  [phone]  Occupational Therapy Treatment  Patient Details  Name: Bob Maldonado MRN: [id number] Date of Birth: Dec 27, 1955 Referring Provider:  Carole Civil, MD  Encounter Date: 03/30/2014      OT End of Session - 03/30/14 1405    Visit Number 33   Number of Visits 40   Date for OT Re-Evaluation 04/16/14   Authorization Type BCBS   OT Start Time 0277   OT Stop Time 1400   OT Time Calculation (min) 53 min   Activity Tolerance Patient tolerated treatment well   Behavior During Therapy [facility] for tasks assessed/performed      Past Medical History  Diagnosis Date  . Hypertension   . Hyperlipidemia   . Gout     per pt stable as of 03-30-2014  . Arthritis   . Prostate cancer first dx in 03/16/11, 08/29/13    Gleason 7, volume 55 mL (urologist-  dr Diona Fanti)  treatment External Beam Radiation therapy  . Type 2 diabetes mellitus   . GERD (gastroesophageal reflux disease)   . Hemorrhoid   . ESRD on hemodialysis     Nephrologist-- Dr Eddie Dibbles--  [facility] in [place]-- M/W/F  . Wears glasses   . At risk for sleep apnea     STOP-BANG= 5      SENT PCP 03-30-2014    Past Surgical History  Procedure Laterality Date  . Insertion of dialysis catheter Right 05/30/2013    Procedure: INSERTION OF DIALYSIS CATHETER;  Surgeon: Conrad [place], MD;  Location: [place];  Service: Vascular;  Laterality: Right;  . Av fistula placement Left 05/30/2013    Procedure: Brachial vein transposition 1st stage;  Surgeon: Conrad [place], MD;  Location: [place];  Service: Vascular;  Laterality: Left;  . Colonoscopy  06-01-2005  . Bascilic vein transposition Left 07/25/2013    Procedure: SECOND STAGE BRACHIAL VEIN TRANSPOSITION;  Surgeon: Conrad [place], MD;  Location: [place];  Service: Vascular;  Laterality: Left;  . Prostate biopsy  last one 08/29/13    Gleason 7, volume  55 mL  . Knee arthroscopy Right 1985    There were no vitals taken for this visit.  Visit Diagnosis:  Decreased range of motion of shoulder, unspecified laterality  Pain in joint, shoulder region, unspecified laterality        [facility] OT Assessment - 03/30/14 0001    Assessment   Diagnosis Bilateral Rotator Cuff syndrome   Precautions   Precautions None               OT Treatments/Exercises (OP) - 03/30/14 0001    Shoulder Exercises: Supine   Protraction Strengthening;10 reps   Protraction Weight (lbs) 4   Horizontal ABduction Strengthening;10 reps   Horizontal ABduction Weight (lbs) 4   External Rotation Strengthening;10 reps   External Rotation Weight (lbs) 4   Internal Rotation Strengthening;10 reps   Internal Rotation Weight (lbs) 4   Flexion Strengthening;10 reps   Shoulder Flexion Weight (lbs) 4   ABduction Strengthening;10 reps   Shoulder ABduction Weight (lbs) 4   Shoulder Exercises: Prone   Retraction Limitations  (all prone completed standing with flexed trunk)   Retraction Weight (lbs) 2   Retraction Limitations All prone exercises completed using counter for a flexed trunk   Extension Strengthening;10 reps   Extension Weight (lbs) 2   External Rotation Strengthening;10 reps  External Rotation Weight (lbs) 2   Internal Rotation Strengthening;10 reps   Internal Rotation Weight (lbs) 2   Horizontal ABduction 1 Strengthening;10 reps   Horizontal ABduction 1 Weight (lbs) 2   Horizontal ABduction 2 Strengthening;10 reps   Horizontal ABduction 2 Weight (lbs) 2   Shoulder Exercises: ROM/Strengthening   Cybex Press 15 reps  4 1/2 plates   Cybex Row 15 reps  4 1/2 plates   "W" Arms modified position 10 repetitions, max difficulty   X to V Arms PNF D2 extension to D2 flexion with left arm only, patient had mod difficulty completing and only had 50% range. 15X   Proximal Shoulder Strengthening, Supine 20X with 4#   Manual Therapy   Manual Therapy  Myofascial release   Myofascial Release MFR and manual stretching to left shoulder, upper arm, scapular, trapezius region to decrease pain and improve mobiity.  also completed manual cervical traction and MFR to sternocleidomastoid muscle.                   OT Short Term Goals - 03/21/14 1236    OT SHORT TERM GOAL #1   Title Pt will be educated on HEP   OT [place] #2   Title Pt will improve BUE shoulder PROM to [facility] in working towards improved abilities donning a shirt   OT SHORT TERM GOAL #3   Title Pt will decrease fascial restrictions to min-mod in BUE for overall decreased pain   OT SHORT TERM GOAL #4   Title Pt will increase strength to grossly 4/5 in BUE for improved ability to hold onto bike handlebars   OT SHORT TERM GOAL #5   Title Pt's will decrease pain to less than 4/10 during daily activites           OT Long Term Goals - 03/21/14 1236    OT LONG TERM GOAL #1   Title Pt will acheive highest level of functioning in all ADL, IADL, work, and leisure activies.   Status On-going   OT LONG TERM GOAL #2   Title Pt will achieve BUE shoulder AROM WFL for improved ability to play a round of golf   OT LONG TERM GOAL #3   Title Pt will decrease fascial restrictions in BUE to minimal or less for decreased overall pain   OT LONG TERM GOAL #4   Title Pt will improve strength in BUE to 5/5 for improved ability to engage in overhead reaching tasks   Status Partially Met   OT LONG TERM GOAL #5   Title Pt will have pain in BUE of less than 2/10 when donning a coat   Status On-going               Plan - 03/30/14 1405    Clinical Impression Statement A:  Completed PNF patterns in standing with improved form and less difficulty this date.  Increased to 4# with supine strengthening and improved to 4 1/2 plates with cybex.   Plan P:  overhead lace for improved functional reaching overhead.        Problem List Patient Active Problem List   Diagnosis Date  Noted  . Rotator cuff syndrome of both shoulders 12/25/2013  . End stage renal disease 07/21/2013  . ESRD on dialysis 06/14/2013  . Generalized weakness 06/14/2013  . Peripheral edema 05/26/2013  . Hyperkalemia 05/26/2013  . Anemia of chronic renal failure 05/26/2013  . Hyperlipidemia 07/14/2012  . Gout 07/14/2012  . Diabetes  mellitus with complication 34/19/6222  . Essential hypertension, benign 12/15/2011  . Obesity 12/15/2011  . Prostate cancer 12/15/2011    Vangie Bicker, OTR/L [phone]  03/30/2014, 2:10 PM  [place], [place], [address] Phone: [phone]   Fax:  [phone]

## 2014-03-30 NOTE — Progress Notes (Signed)
NPO AFTER MN WITH EXCEPTION CLEAR LIQUIDS UNTIL 0700 (NO CREAM/ MILK PRODUCTS).  ARRIVE AT 1130.  ASK MDA IF ISTAT NEEDED SINCE PT ON HEMODIALYSIS. OTHERWISE HAS CURRENT LAB RESULTS, CXR AND EKG IN CHART AND EPIC. WILL TAKE PRILOSEC AM DOS W/ SIPS OF WATER AND DO FLEET ENEMA.

## 2014-03-30 NOTE — Progress Notes (Signed)
   03/30/14 1042  OBSTRUCTIVE SLEEP APNEA  Have you ever been diagnosed with sleep apnea through a sleep study? No  Do you snore loudly (loud enough to be heard through closed doors)?  1  Do you often feel tired, fatigued, or sleepy during the daytime? 0  Has anyone observed you stop breathing during your sleep? 1  Do you have, or are you being treated for high blood pressure? 1  BMI more than 35 kg/m2? 0  Age over 60 years old? 1  Gender: 1  Obstructive Sleep Apnea Score 5  Score 4 or greater  Results sent to PCP

## 2014-04-03 ENCOUNTER — Ambulatory Visit (HOSPITAL_COMMUNITY)
Admission: RE | Admit: 2014-04-03 | Discharge: 2014-04-03 | Disposition: A | Discharge status: Home / Self Care | Payer: BLUE CROSS/BLUE SHIELD | Source: Ambulatory Visit | Attending: Orthopedic Surgery | Admitting: Orthopedic Surgery

## 2014-04-03 ENCOUNTER — Encounter (HOSPITAL_COMMUNITY): Payer: Self-pay

## 2014-04-03 DIAGNOSIS — R29898 Other symptoms and signs involving the musculoskeletal system: Secondary | ICD-10-CM

## 2014-04-03 DIAGNOSIS — Z5189 Encounter for other specified aftercare: Secondary | ICD-10-CM | POA: Diagnosis not present

## 2014-04-03 DIAGNOSIS — M25619 Stiffness of unspecified shoulder, not elsewhere classified: Secondary | ICD-10-CM

## 2014-04-03 DIAGNOSIS — M25519 Pain in unspecified shoulder: Secondary | ICD-10-CM

## 2014-04-03 NOTE — Therapy (Signed)
[place], [place], [address] Phone: [phone]   Fax:  [phone]  Occupational Therapy Treatment  Patient Details  Name: Bob Maldonado MRN: [id number] Date of Birth: 02-Oct-1955 Referring Provider:  Carole Civil, MD  Encounter Date: 04/03/2014      OT End of Session - 04/03/14 1348    Visit Number 34   Number of Visits 40   Date for OT Re-Evaluation 04/16/14   Authorization Type BCBS   Authorization Time Period n/a   OT Start Time 1302   OT Stop Time 1345   OT Time Calculation (min) 43 min   Activity Tolerance Patient tolerated treatment well   Behavior During Therapy [facility] for tasks assessed/performed      Past Medical History  Diagnosis Date  . Hypertension   . Hyperlipidemia   . Gout     per pt stable as of 03-30-2014  . Arthritis   . Prostate cancer first dx in 03/16/11, 08/29/13    Gleason 7, volume 55 mL (urologist-  dr Diona Fanti)  treatment External Beam Radiation therapy  . Type 2 diabetes mellitus   . GERD (gastroesophageal reflux disease)   . Hemorrhoid   . ESRD on hemodialysis     Nephrologist-- Dr Eddie Dibbles--  [facility] in [place]-- M/W/F  . Wears glasses   . At risk for sleep apnea     STOP-BANG= 5      SENT PCP 03-30-2014    Past Surgical History  Procedure Laterality Date  . Insertion of dialysis catheter Right 05/30/2013    Procedure: INSERTION OF DIALYSIS CATHETER;  Surgeon: Conrad Kenedy, MD;  Location: [place];  Service: Vascular;  Laterality: Right;  . Av fistula placement Left 05/30/2013    Procedure: Brachial vein transposition 1st stage;  Surgeon: Conrad Avilla, MD;  Location: [place];  Service: Vascular;  Laterality: Left;  . Colonoscopy  06-01-2005  . Bascilic vein transposition Left 07/25/2013    Procedure: SECOND STAGE BRACHIAL VEIN TRANSPOSITION;  Surgeon: Conrad Lukachukai, MD;  Location: [place];  Service: Vascular;  Laterality: Left;  . Prostate biopsy  last  one 08/29/13    Gleason 7, volume 55 mL  . Knee arthroscopy Right 1985    There were no vitals taken for this visit.  Visit Diagnosis:  Decreased range of motion of shoulder, unspecified laterality  Shoulder weakness  Pain in joint, shoulder region, unspecified laterality      Subjective Assessment - 04/03/14 1304    Symptoms S: When I put some heat on it I can lift it a little higher.    Currently in Pain? Yes   Pain Score 5    Pain Location Shoulder   Pain Orientation Left                 OT Treatments/Exercises (OP) - 04/03/14 1306    Exercises   Exercises Shoulder   Shoulder Exercises: Supine   Protraction Strengthening;10 reps   Protraction Weight (lbs) 4   Horizontal ABduction Strengthening;10 reps   Horizontal ABduction Weight (lbs) 4   External Rotation Strengthening;10 reps   External Rotation Weight (lbs) 4   Internal Rotation Strengthening;10 reps   Internal Rotation Weight (lbs) 4   Flexion Strengthening;10 reps   Shoulder Flexion Weight (lbs) 4   ABduction Strengthening;10 reps   Shoulder ABduction Weight (lbs) 4   Shoulder Exercises: Prone   Retraction Limitations   Retraction Weight (lbs) 2  Retraction Limitations All prone exercises completed using counter for a flexed trunk   Flexion Strengthening;15 reps   Flexion Weight (lbs) 2   Extension Strengthening;15 reps   Extension Weight (lbs) 2   External Rotation Strengthening;15 reps   External Rotation Weight (lbs) 2   Internal Rotation Strengthening;15 reps   Internal Rotation Weight (lbs) 2   Horizontal ABduction 1 Strengthening;15 reps   Horizontal ABduction 1 Weight (lbs) 2   Horizontal ABduction 2 Strengthening;15 reps   Horizontal ABduction 2 Weight (lbs) 2   Shoulder Exercises: ROM/Strengthening   Over Head Lace 2"   Proximal Shoulder Strengthening, Supine 20X with 4#   Manual Therapy   Manual Therapy Myofascial release   Myofascial Release MFR and manual stretching to left  shoulder, upper arm, scapular, trapezius region to decrease pain and improve mobiity.                   OT Short Term Goals - 03/21/14 1236    OT SHORT TERM GOAL #1   Title Pt will be educated on HEP   OT [place] #2   Title Pt will improve BUE shoulder PROM to [facility] in working towards improved abilities donning a shirt   OT SHORT TERM GOAL #3   Title Pt will decrease fascial restrictions to min-mod in BUE for overall decreased pain   OT SHORT TERM GOAL #4   Title Pt will increase strength to grossly 4/5 in BUE for improved ability to hold onto bike handlebars   OT SHORT TERM GOAL #5   Title Pt's will decrease pain to less than 4/10 during daily activites           OT Long Term Goals - 03/21/14 1236    OT LONG TERM GOAL #1   Title Pt will acheive highest level of functioning in all ADL, IADL, work, and leisure activies.   Status On-going   OT LONG TERM GOAL #2   Title Pt will achieve BUE shoulder AROM WFL for improved ability to play a round of golf   OT LONG TERM GOAL #3   Title Pt will decrease fascial restrictions in BUE to minimal or less for decreased overall pain   OT LONG TERM GOAL #4   Title Pt will improve strength in BUE to 5/5 for improved ability to engage in overhead reaching tasks   Status Partially Met   OT LONG TERM GOAL #5   Title Pt will have pain in BUE of less than 2/10 when donning a coat   Status On-going               Plan - 04/03/14 1349    Clinical Impression Statement A: Added overhead lacing, 2 minutes. Increased standing prone exercises to 15 repetitions.    Plan P: Resume standing exercises, attempt to increase weights or repetitions. Add ball on wall.         Problem List Patient Active Problem List   Diagnosis Date Noted  . Rotator cuff syndrome of both shoulders 12/25/2013  . End stage renal disease 07/21/2013  . ESRD on dialysis 06/14/2013  . Generalized weakness 06/14/2013  . Peripheral edema 05/26/2013  .  Hyperkalemia 05/26/2013  . Anemia of chronic renal failure 05/26/2013  . Hyperlipidemia 07/14/2012  . Gout 07/14/2012  . Diabetes mellitus with complication 26/71/2458  . Essential hypertension, benign 12/15/2011  . Obesity 12/15/2011  . Prostate cancer 12/15/2011    Ailene Ravel, OTR/L,CBIS  [phone]  04/03/2014, 1:53  PM  [place], [place], [address] Phone: [phone]   Fax:  [phone]

## 2014-04-04 ENCOUNTER — Telehealth: Payer: Self-pay | Admitting: *Deleted

## 2014-04-04 ENCOUNTER — Ambulatory Visit (HOSPITAL_COMMUNITY): Payer: BLUE CROSS/BLUE SHIELD | Admitting: Specialist

## 2014-04-04 NOTE — Telephone Encounter (Signed)
CALLED PATIENT TO REMIND OF PROCEDURE FOR 04-05-14, SPOKE WITH PATIENT AND HE IS AWARE OF THIS PROCEDURE.

## 2014-04-05 ENCOUNTER — Ambulatory Visit (HOSPITAL_BASED_OUTPATIENT_CLINIC_OR_DEPARTMENT_OTHER)
Admission: RE | Admit: 2014-04-05 | Discharge: 2014-04-05 | Disposition: A | Discharge status: Home / Self Care | Payer: BLUE CROSS/BLUE SHIELD | Source: Ambulatory Visit | Attending: Urology | Admitting: Urology

## 2014-04-05 ENCOUNTER — Ambulatory Visit (HOSPITAL_COMMUNITY): Payer: BLUE CROSS/BLUE SHIELD

## 2014-04-05 ENCOUNTER — Encounter (HOSPITAL_BASED_OUTPATIENT_CLINIC_OR_DEPARTMENT_OTHER)
Admission: RE | Disposition: A | Discharge status: Home / Self Care | Payer: Self-pay | Source: Ambulatory Visit | Attending: Urology

## 2014-04-05 ENCOUNTER — Ambulatory Visit (HOSPITAL_BASED_OUTPATIENT_CLINIC_OR_DEPARTMENT_OTHER): Payer: BLUE CROSS/BLUE SHIELD | Admitting: Anesthesiology

## 2014-04-05 ENCOUNTER — Encounter: Payer: Self-pay | Admitting: Radiation Oncology

## 2014-04-05 ENCOUNTER — Encounter (HOSPITAL_BASED_OUTPATIENT_CLINIC_OR_DEPARTMENT_OTHER): Payer: Self-pay | Admitting: *Deleted

## 2014-04-05 DIAGNOSIS — Z79899 Other long term (current) drug therapy: Secondary | ICD-10-CM | POA: Insufficient documentation

## 2014-04-05 DIAGNOSIS — Z9889 Other specified postprocedural states: Secondary | ICD-10-CM | POA: Insufficient documentation

## 2014-04-05 DIAGNOSIS — N186 End stage renal disease: Secondary | ICD-10-CM | POA: Insufficient documentation

## 2014-04-05 DIAGNOSIS — K219 Gastro-esophageal reflux disease without esophagitis: Secondary | ICD-10-CM | POA: Insufficient documentation

## 2014-04-05 DIAGNOSIS — I120 Hypertensive chronic kidney disease with stage 5 chronic kidney disease or end stage renal disease: Secondary | ICD-10-CM | POA: Insufficient documentation

## 2014-04-05 DIAGNOSIS — Z992 Dependence on renal dialysis: Secondary | ICD-10-CM | POA: Insufficient documentation

## 2014-04-05 DIAGNOSIS — Z87891 Personal history of nicotine dependence: Secondary | ICD-10-CM | POA: Insufficient documentation

## 2014-04-05 DIAGNOSIS — E119 Type 2 diabetes mellitus without complications: Secondary | ICD-10-CM | POA: Insufficient documentation

## 2014-04-05 DIAGNOSIS — E785 Hyperlipidemia, unspecified: Secondary | ICD-10-CM | POA: Insufficient documentation

## 2014-04-05 DIAGNOSIS — C61 Malignant neoplasm of prostate: Secondary | ICD-10-CM

## 2014-04-05 DIAGNOSIS — M109 Gout, unspecified: Secondary | ICD-10-CM | POA: Insufficient documentation

## 2014-04-05 HISTORY — DX: Dependence on renal dialysis: Z99.2

## 2014-04-05 HISTORY — DX: Type 2 diabetes mellitus without complications: E11.9

## 2014-04-05 HISTORY — DX: Presence of spectacles and contact lenses: Z97.3

## 2014-04-05 HISTORY — DX: Unspecified hemorrhoids: K64.9

## 2014-04-05 HISTORY — DX: Gastro-esophageal reflux disease without esophagitis: K21.9

## 2014-04-05 HISTORY — DX: Dependence on renal dialysis: N18.6

## 2014-04-05 HISTORY — PX: RADIOACTIVE SEED IMPLANT: SHX5150

## 2014-04-05 HISTORY — DX: Other specified personal risk factors, not elsewhere classified: Z91.89

## 2014-04-05 LAB — POCT I-STAT, CHEM 8
BUN: 48 mg/dL — ABNORMAL HIGH (ref 6–23)
CALCIUM ION: 1.15 mmol/L (ref 1.13–1.30)
Chloride: 98 mmol/L (ref 96–112)
Creatinine, Ser: 8.3 mg/dL — ABNORMAL HIGH (ref 0.50–1.35)
Glucose, Bld: 128 mg/dL — ABNORMAL HIGH (ref 70–99)
HCT: 39 % (ref 39.0–52.0)
Hemoglobin: 13.3 g/dL (ref 13.0–17.0)
Potassium: 4.7 mmol/L (ref 3.5–5.1)
Sodium: 140 mmol/L (ref 135–145)
TCO2: 28 mmol/L (ref 0–100)

## 2014-04-05 LAB — GLUCOSE, CAPILLARY: Glucose-Capillary: 118 mg/dL — ABNORMAL HIGH (ref 70–99)

## 2014-04-05 SURGERY — INSERTION, RADIATION SOURCE, PROSTATE
Anesthesia: General | Site: Prostate

## 2014-04-05 MED ORDER — LIDOCAINE HCL (CARDIAC) 20 MG/ML IV SOLN
INTRAVENOUS | Status: DC | PRN
  Administered 2014-04-05: 100 mg via INTRAVENOUS

## 2014-04-05 MED ORDER — NEOSTIGMINE METHYLSULFATE 10 MG/10ML IV SOLN
INTRAVENOUS | Status: DC | PRN
  Administered 2014-04-05: 4 mg via INTRAVENOUS
  Administered 2014-04-05: 1 mg via INTRAVENOUS

## 2014-04-05 MED ORDER — FENTANYL CITRATE 0.05 MG/ML IJ SOLN
INTRAMUSCULAR | Status: AC
  Filled 2014-04-05: qty 4

## 2014-04-05 MED ORDER — MIDAZOLAM HCL 2 MG/2ML IJ SOLN
INTRAMUSCULAR | Status: AC
  Filled 2014-04-05: qty 2

## 2014-04-05 MED ORDER — PHENYLEPHRINE HCL 10 MG/ML IJ SOLN
20.0000 mg | INTRAVENOUS | Status: DC | PRN
  Administered 2014-04-05: 20 ug/min via INTRAVENOUS

## 2014-04-05 MED ORDER — GLYCOPYRROLATE 0.2 MG/ML IJ SOLN
INTRAMUSCULAR | Status: DC | PRN
  Administered 2014-04-05: 0.6 mg via INTRAVENOUS
  Administered 2014-04-05: 0.2 mg via INTRAVENOUS

## 2014-04-05 MED ORDER — HYDROMORPHONE HCL 1 MG/ML IJ SOLN
0.2500 mg | INTRAMUSCULAR | Status: DC | PRN
  Filled 2014-04-05: qty 1

## 2014-04-05 MED ORDER — PHENYLEPHRINE HCL 10 MG/ML IJ SOLN: 20.0000 mg | INTRAVENOUS | Status: DC | PRN

## 2014-04-05 MED ORDER — SODIUM CHLORIDE 0.9 % IV SOLN
INTRAVENOUS | Status: DC
  Administered 2014-04-05: 12:00:00 via INTRAVENOUS
  Filled 2014-04-05: qty 1000

## 2014-04-05 MED ORDER — PROPOFOL 10 MG/ML IV BOLUS
INTRAVENOUS | Status: DC | PRN
  Administered 2014-04-05: 20 mg via INTRAVENOUS
  Administered 2014-04-05: 30 mg via INTRAVENOUS
  Administered 2014-04-05: 200 mg via INTRAVENOUS

## 2014-04-05 MED ORDER — ONDANSETRON HCL 4 MG/2ML IJ SOLN
INTRAMUSCULAR | Status: DC | PRN
  Administered 2014-04-05: 4 mg via INTRAVENOUS

## 2014-04-05 MED ORDER — ACETAMINOPHEN 10 MG/ML IV SOLN
INTRAVENOUS | Status: DC | PRN
  Administered 2014-04-05: 1000 mg via INTRAVENOUS

## 2014-04-05 MED ORDER — IOHEXOL 350 MG/ML SOLN
INTRAVENOUS | Status: DC | PRN
  Administered 2014-04-05: 7 mL

## 2014-04-05 MED ORDER — STERILE WATER FOR IRRIGATION IR SOLN
Status: DC | PRN
  Administered 2014-04-05: 3000 mL

## 2014-04-05 MED ORDER — FLEET ENEMA 7-19 GM/118ML RE ENEM
1.0000 | ENEMA | Freq: Once | RECTAL | Status: DC
  Filled 2014-04-05: qty 1

## 2014-04-05 MED ORDER — LIDOCAINE HCL 4 % MT SOLN
OROMUCOSAL | Status: DC | PRN
  Administered 2014-04-05: 3 mL via TOPICAL

## 2014-04-05 MED ORDER — CIPROFLOXACIN IN D5W 400 MG/200ML IV SOLN
INTRAVENOUS | Status: AC
  Filled 2014-04-05: qty 200

## 2014-04-05 MED ORDER — MIDAZOLAM HCL 5 MG/5ML IJ SOLN
INTRAMUSCULAR | Status: DC | PRN
  Administered 2014-04-05: 2 mg via INTRAVENOUS

## 2014-04-05 MED ORDER — DEXAMETHASONE SODIUM PHOSPHATE 4 MG/ML IJ SOLN
INTRAMUSCULAR | Status: DC | PRN
  Administered 2014-04-05: 10 mg via INTRAVENOUS

## 2014-04-05 MED ORDER — CIPROFLOXACIN IN D5W 400 MG/200ML IV SOLN
400.0000 mg | INTRAVENOUS | Status: AC
  Administered 2014-04-05: 400 mg via INTRAVENOUS
  Filled 2014-04-05: qty 200

## 2014-04-05 MED ORDER — MEPERIDINE HCL 25 MG/ML IJ SOLN
6.2500 mg | INTRAMUSCULAR | Status: DC | PRN
  Filled 2014-04-05: qty 1

## 2014-04-05 MED ORDER — LACTATED RINGERS IV SOLN
INTRAVENOUS | Status: DC
  Filled 2014-04-05: qty 1000

## 2014-04-05 MED ORDER — FENTANYL CITRATE 0.05 MG/ML IJ SOLN
INTRAMUSCULAR | Status: DC | PRN
  Administered 2014-04-05: 50 ug via INTRAVENOUS
  Administered 2014-04-05: 25 ug via INTRAVENOUS
  Administered 2014-04-05: 50 ug via INTRAVENOUS
  Administered 2014-04-05 (×3): 25 ug via INTRAVENOUS

## 2014-04-05 MED ORDER — PROMETHAZINE HCL 25 MG/ML IJ SOLN
6.2500 mg | INTRAMUSCULAR | Status: DC | PRN
  Filled 2014-04-05: qty 1

## 2014-04-05 MED ORDER — CISATRACURIUM BESYLATE (PF) 10 MG/5ML IV SOLN
INTRAVENOUS | Status: DC | PRN
  Administered 2014-04-05 (×3): 2 mg via INTRAVENOUS
  Administered 2014-04-05: 10 mg via INTRAVENOUS

## 2014-04-05 MED ORDER — HYDROCODONE-ACETAMINOPHEN 5-325 MG PO TABS: 1.0000 | ORAL_TABLET | ORAL | Status: DC | PRN

## 2014-04-05 SURGICAL SUPPLY — 26 items
BAG URINE DRAINAGE (UROLOGICAL SUPPLIES) ×2 IMPLANT
BLADE CLIPPER SURG (BLADE) ×2 IMPLANT
CATH FOLEY 2WAY SLVR  5CC 16FR (CATHETERS) ×1
CATH FOLEY 2WAY SLVR 5CC 16FR (CATHETERS) ×1 IMPLANT
CATH ROBINSON RED A/P 20FR (CATHETERS) ×2 IMPLANT
CLOTH BEACON ORANGE TIMEOUT ST (SAFETY) ×2 IMPLANT
COVER MAYO STAND STRL (DRAPES) ×2 IMPLANT
COVER TABLE BACK 60X90 (DRAPES) ×2 IMPLANT
DRSG TEGADERM 4X4.75 (GAUZE/BANDAGES/DRESSINGS) ×2 IMPLANT
DRSG TEGADERM 8X12 (GAUZE/BANDAGES/DRESSINGS) ×2 IMPLANT
GLOVE BIO SURGEON STRL SZ7.5 (GLOVE) ×10 IMPLANT
GLOVE BIO SURGEON STRL SZ8 (GLOVE) ×4 IMPLANT
GLOVE BIOGEL M 6.5 STRL (GLOVE) ×2 IMPLANT
GLOVE BIOGEL PI IND STRL 6.5 (GLOVE) ×2 IMPLANT
GLOVE BIOGEL PI IND STRL 7.5 (GLOVE) ×1 IMPLANT
GLOVE BIOGEL PI INDICATOR 6.5 (GLOVE) ×2
GLOVE BIOGEL PI INDICATOR 7.5 (GLOVE) ×1
GOWN STRL REUS W/TWL LRG LVL3 (GOWN DISPOSABLE) ×2 IMPLANT
GOWN STRL REUS W/TWL XL LVL3 (GOWN DISPOSABLE) ×2 IMPLANT
NUCLETRON SELECTSEED ×122 IMPLANT
PACK CYSTO (CUSTOM PROCEDURE TRAY) ×2 IMPLANT
SPONGE GAUZE 4X4 12PLY STER LF (GAUZE/BANDAGES/DRESSINGS) ×2 IMPLANT
SYRINGE 10CC LL (SYRINGE) ×2 IMPLANT
UNDERPAD 30X30 INCONTINENT (UNDERPADS AND DIAPERS) ×4 IMPLANT
WATER STERILE IRR 3000ML UROMA (IV SOLUTION) ×2 IMPLANT
WATER STERILE IRR 500ML POUR (IV SOLUTION) ×2 IMPLANT

## 2014-04-05 NOTE — Transfer of Care (Addendum)
Last Vitals:  Filed Vitals:   04/05/14 1211  BP: 161/67  Pulse: 65  Temp: 37.1 C  Resp: 16    Immediate Anesthesia Transfer of Care Note  Patient: Bob Maldonado  Procedure(s) Performed: Procedure(s) (LRB): RADIOACTIVE SEED IMPLANT (N/A)  Patient Location: PACU  Anesthesia Type: General  Level of Consciousness: awake, alert  and oriented  Airway & Oxygen Therapy: Patient Spontanous Breathing and Patient connected to face mask oxygen  Post-op Assessment: Report given to PACU RN and Post -op Vital signs reviewed and stable  Post vital signs: Reviewed and stable  Complications: No apparent anesthesia complications

## 2014-04-05 NOTE — Progress Notes (Signed)
Patient did enema at home at 0930.Marland Kitchen Patient stated he good results.

## 2014-04-05 NOTE — Discharge Instructions (Signed)
Radioactive Seed Implant Home Care Instructions   Activity:    Rest for the remainder of the day.  Do not drive or operate equipment today.  You may resume normal  activities in a few days as instructed by your physician, without risk of harmful radiation exposure to those around you, provided you follow the time and distance precautions on the Radiation Oncology Instruction Sheet.   Meals: Drink plenty of lipuids and eat light foods, such as gelatin or soup this evening .  You may return to normal meal plan tomorrow.  Return To Work: You may return to work as instructed by Naval [employer].  Special Instruction:   If any seeds are found, use tweezers to pick up seeds and place in a glass container of any kind and bring to your physician's office.  Call your physician if any of these symptoms occur:   Persistent or heavy bleeding  Urine stream diminishes or stops completely after catheter is removed  Fever equal to or greater than 101 degrees F  Cloudy urine with a strong foul odor  Severe pain  You may feel some burning pain and/or hesitancy when you urinate after the catheter is removed.  These symptoms may increase over the next few weeks, but should diminish within forur to six weeks.  Applying moist heat to the lower abdomen or a hot tub bath may help relieve the pain.  If the discomfort becomes severe, please call your physician for additional medications.  Follow-up (Date of Return Visit to Physician):3/10 @ 1 pm to see Jimmey Ralph, NP  Patient:_______________________________   @DATE @  Nurse:________________________________ @DATE @  Post Anesthesia Home Care Instructions  Activity: Get plenty of rest for the remainder of the day. A responsible adult should stay with you for 24 hours following the procedure.  For the next 24 hours, DO NOT: -Drive a car -Paediatric nurse -Drink alcoholic beverages -Take any medication unless instructed by your physician -Make any legal  decisions or sign important papers.  Meals: Start with liquid foods such as gelatin or soup. Progress to regular foods as tolerated. Avoid greasy, spicy, heavy foods. If nausea and/or vomiting occur, drink only clear liquids until the nausea and/or vomiting subsides. Call your physician if vomiting continues.  Special Instructions/Symptoms: Your throat may feel dry or sore from the anesthesia or the breathing tube placed in your throat during surgery. If this causes discomfort, gargle with warm salt water. The discomfort should disappear within 24 hours.   Remove dressing from perineum tonight or in the am.  Leave area open to air.  Call the office for any questions and or concerns

## 2014-04-05 NOTE — Op Note (Signed)
Preoperative diagnosis: Clinical stage TI C adenocarcinoma the prostate   Postoperative diagnosis: Same   Procedure: I-125 prostate seed implantation with Nucletron robotic implanter   Surgeon: Lillette Boxer. Joyel Chenette M.D.  Radiation Oncologist:Murray  Anesthesia: Gen.   Indications: Patient  was diagnosed with clinical stage TI C prostate cancer. We had extensive discussion with him about treatment options versus. He elected to proceed with combination radiotherapy and ADT. He underwent consultation my office as well as with Dr. Valere Dross. He appeared to understand the advantages disadvantages potential risks of this treatment option. Full informed consent has been obtained.   Technique and findings: Patient was brought the operating room where he had successful induction of general anesthesia. He was placed in dorso-lithotomy position and prepped and draped in usual manner. Appropriate surgical timeout was performed. Radiation oncology department placed a transrectal ultrasound probe anchoring stand. Foley catheter with contrast in the balloon was inserted without difficulty. Anchoring needles were placed within the prostate. Rectal tube was placed. Real-time contouring of the urethra prostate and rectum were performed and the dosing parameters were established. Targeted dose was 110 gray.  I was then called  to the operating suite suite for placement of the needles. A second timeout was performed. All needle passage was done with real-time transrectal ultrasound guidance with the sagittal plane. A total of 28 needles were placed. The implantation itself was done with the robotic implanter. 61 active seeds were implanted. The Foley catheter was removed and flexible cystoscopy failed to show any seeds outside the prostate.  The patient was brought to recovery room in stable condition, having tolerated the procedure well.Marland Kitchen

## 2014-04-05 NOTE — Anesthesia Preprocedure Evaluation (Addendum)
Anesthesia Evaluation  Patient identified by MRN, date of birth, ID band Patient awake    Reviewed: Allergy & Precautions, NPO status , Patient's Chart, lab work & pertinent test results  Airway Mallampati: II  TM Distance: >3 FB Neck ROM: Full    Dental no notable dental hx. (+) Edentulous Upper, Edentulous Lower, Lower Dentures, Upper Dentures   Pulmonary neg pulmonary ROS, former smoker,  breath sounds clear to auscultation  Pulmonary exam normal       Cardiovascular hypertension, Pt. on medications Rhythm:Regular Rate:Normal     Neuro/Psych negative neurological ROS  negative psych ROS   GI/Hepatic negative GI ROS, Neg liver ROS,   Endo/Other  diabetes, Well Controlled, Type 2, Oral Hypoglycemic Agents, Insulin Dependent  Renal/GU ESRF and DialysisRenal disease  negative genitourinary   Musculoskeletal negative musculoskeletal ROS (+)   Abdominal   Peds negative pediatric ROS (+)  Hematology negative hematology ROS (+)   Anesthesia Other Findings   Reproductive/Obstetrics negative OB ROS                            Anesthesia Physical Anesthesia Plan  ASA: IV  Anesthesia Plan: General   Post-op Pain Management:    Induction: Intravenous  Airway Management Planned: Oral ETT  Additional Equipment:   Intra-op Plan:   Post-operative Plan: Extubation in OR  Informed Consent: I have reviewed the patients History and Physical, chart, labs and discussed the procedure including the risks, benefits and alternatives for the proposed anesthesia with the patient or authorized representative who has indicated his/her understanding and acceptance.   Dental advisory given  Plan Discussed with: CRNA  Anesthesia Plan Comments:         Anesthesia Quick Evaluation

## 2014-04-05 NOTE — Progress Notes (Signed)
[facility] Brachytherapy Operative Procedure Note  Name: Bob Maldonado MRN: [id number]  Date:   02/06/2014           DOB: 04-Aug-1955  Status:outpatient    IB:[id number], Lonell Grandchild, MD  Dr. Franchot Gallo  DIAGNOSIS: A 59 year old gentlemen with stage T2b adenocarcinoma of the prostate with a Gleason of 7 and a PSA of 7.43.  PROCEDURE: Insertion of radioactive I-125 seeds into the prostate gland.  RADIATION DOSE: 110 Gy, boost therapy.  TECHNIQUE: Bob Maldonado was brought to the operating room with Dr. Diona Fanti. He was placed in the dorsolithotomy position. He was catheterized and a rectal tube was inserted. The perineum was shaved, prepped and draped. The ultrasound probe was then introduced into the rectum to see the prostate gland.  TREATMENT DEVICE: A needle grid was attached to the ultrasound probe stand and anchor needles were placed.  COMPLEX ISODOSE CALCULATION: The prostate was imaged in 3D using a sagittal sweep of the prostate probe. These images were transferred to the planning computer. There, the prostate, urethra and rectum were defined on each axial reconstructed image. Then, the software created an optimized plan and a few seed positions were adjusted. Then the accepted plan was uploaded to the seed Selectron afterloading unit.  SPECIAL TREATMENT PROCEDURE/SUPERVISION AND HANDLING: The Nucletron FIRST system was used to place the needles under sagittal guidance. A total of 28 needles were used to deposit 61 seeds in the prostate gland. The individual seed activity was 0.362 mCi for a total implant activity of 22.08 mCi.  COMPLEX SIMULATION: At the end of the procedure, an anterior radiograph of the pelvis was obtained to document seed positioning and count. Cystoscopy was performed to check the urethra and bladder.  MICRODOSIMETRY: At the end of the procedure, the patient was emitting 0.08 mrem/hr at 1 meter. Accordingly, he was  considered safe for hospital discharge.  PLAN: The patient will return to the radiation oncology clinic for post implant CT dosimetry in three weeks.

## 2014-04-05 NOTE — Anesthesia Procedure Notes (Signed)
Procedure Name: Intubation Date/Time: 04/05/2014 1:10 PM Performed by: Mechele Claude Pre-anesthesia Checklist: Patient identified, Emergency Drugs available, Suction available and Patient being monitored Patient Re-evaluated:Patient Re-evaluated prior to inductionOxygen Delivery Method: Circle System Utilized Preoxygenation: Pre-oxygenation with 100% oxygen Intubation Type: IV induction Ventilation: Mask ventilation without difficulty Laryngoscope Size: Mac and 4 Grade View: Grade II Tube type: Oral Tube size: 8.0 mm Number of attempts: 1 Airway Equipment and Method: Stylet and LTA kit utilized Placement Confirmation: ETT inserted through vocal cords under direct vision,  positive ETCO2 and breath sounds checked- equal and bilateral Secured at: 21 cm Tube secured with: Tape Dental Injury: Teeth and Oropharynx as per pre-operative assessment

## 2014-04-05 NOTE — Progress Notes (Signed)
[place] [facility] End of Treatment Note  Name:Bob Maldonado  Date: 04/05/2014 [id number] DOB:Feb 05, 1956   Status:outpatient    CC: Vic Blackbird, MD  Dr. Franchot Gallo  REFERRING PHYSICIAN: Dr. Franchot Gallo    DIAGNOSIS: Stage T2b Intermediate to high risk adenocarcinoma prostate  INDICATION FOR TREATMENT: Curative   TREATMENT DATES: External beam 02/13/2014 through 03/21/2014.  Seed implant 04/05/2014                          SITE/DOSE:   4500 cGy 25 sessions with external beam, followed by iodine 125 seed implant boost  to deliver 11,000 cGy.  Individual seed activity 0.362 mCi per seed for a total implant activity of 22.08 mCi.  He was implanted with 61 seeds utilizing 28 active needles.                       BEAMS/ENERGY:   4 field technique with 15 MV photons for his external beam.  I-125 seeds for his seed implant boost.                NARRATIVE:  The patient appears to have undergone a successful seed implant boost with Dr. Diona Fanti.                          PLAN: Follow-up visit in 3 weeks at which time we will obtain a CT scan for his post implant dosimetry.

## 2014-04-05 NOTE — Anesthesia Postprocedure Evaluation (Signed)
  Anesthesia Post-op Note  Patient: Bob Maldonado  Procedure(s) Performed: Procedure(s) with comments: RADIOACTIVE SEED IMPLANT (N/A) - 71  SEEDS IMPLANTED  NO SEEDS FOUND IN BLADDER  Patient Location: PACU  Anesthesia Type:General  Level of Consciousness: awake and alert   Airway and Oxygen Therapy: Patient Spontanous Breathing  Post-op Pain: none  Post-op Assessment: Post-op Vital signs reviewed  Post-op Vital Signs: Reviewed  Last Vitals:  Filed Vitals:   04/05/14 1745  BP: 164/77  Pulse: 64  Temp: 36.6 C  Resp: 18    Complications: No apparent anesthesia complications

## 2014-04-05 NOTE — H&P (Signed)
Urology History and Physical Exam  CC: Prostate cancer  HPI: 59 year old male presents for brachytherapy to complete radiotherapy. Bob Maldonado is     Bob Maldonado was initially diagnosed with prostate cancer in 2013.  Unfortunately, because of some medical issues, Bob Maldonado did not undergo treatment, and was not followed appropriately between then and now.  Bob Maldonado presented with a PSA just under 18.  Bob Maldonado underwent ultrasound and biopsy of his prostate earlier this month.  9/12 cores taken revealed adenocarcinoma, most Gleason 3+4.  Seminal vesicles were not biopsied.  Bone scan and CT scan were basically unremarkable.   Prostatic volume was 55 mL.  Bob Maldonado does have a slow stream.  Bob Maldonado is on dialysis but does still make urine.   Bob Maldonado was started on androgen deprivation therapy in October, 2015.  Bob Maldonado has completed EBRT.   PMH: Past Medical History  Diagnosis Date  . Hypertension   . Hyperlipidemia   . Gout     per pt stable as of 03-30-2014  . Arthritis   . Prostate cancer first dx in 03/16/11, 08/29/13    Gleason 7, volume 55 mL (urologist-  dr Diona Fanti)  treatment External Beam Radiation therapy  . Type 2 diabetes mellitus   . GERD (gastroesophageal reflux disease)   . Hemorrhoid   . ESRD on hemodialysis     Nephrologist-- Dr Eddie Dibbles--  [facility] in [place]-- M/W/F  . Wears glasses   . At risk for sleep apnea     STOP-BANG= 5      SENT PCP 03-30-2014    PSH: Past Surgical History  Procedure Laterality Date  . Insertion of dialysis catheter Right 05/30/2013    Procedure: INSERTION OF DIALYSIS CATHETER;  Surgeon: Conrad [place], MD;  Location: [place];  Service: Vascular;  Laterality: Right;  . Av fistula placement Left 05/30/2013    Procedure: Brachial vein transposition 1st stage;  Surgeon: Conrad Adjuntas, MD;  Location: [place];  Service: Vascular;  Laterality: Left;  . Colonoscopy  06-01-2005  . Bascilic vein transposition Left 07/25/2013    Procedure: SECOND STAGE BRACHIAL VEIN TRANSPOSITION;  Surgeon:  Conrad Bismarck, MD;  Location: [place];  Service: Vascular;  Laterality: Left;  . Prostate biopsy  last one 08/29/13    Gleason 7, volume 55 mL  . Knee arthroscopy Right 1985    Allergies: No Known Allergies  Medications: No prescriptions prior to admission     Social History: History   Social History  . Marital Status: Married    Spouse Name: N/A  . Number of Children: N/A  . Years of Education: N/A   Occupational History  . Not on file.   Social History Main Topics  . Smoking status: Former Smoker -- 1.00 packs/day for 20 years    Types: Cigarettes    Quit date: 09/27/1990  . Smokeless tobacco: Never Used  . Alcohol Use: No     Comment: rare  . Drug Use: No  . Sexual Activity: Not on file   Other Topics Concern  . Not on file   Social History Narrative    Family History: Family History  Problem Relation Age of Onset  . Diabetes Mother   . Heart disease Mother   . Heart attack Mother   . Cancer Father   . Heart disease Brother     Review of Systems: Genitourinary: weak urinary stream, urinary stream starts and stops, erectile dysfunction, penile pain and initiating urination requires straining.  Gastrointestinal: nausea,  vomiting and constipation.  Constitutional: feeling tired (fatigue) and recent weight loss.  Integumentary: pruritus.  Eyes: blurred vision.  ENT: sinus problems.  Cardiovascular: leg swelling.  Respiratory: shortness of breath.  Endocrine: polydipsia.  Musculoskeletal: joint pain.  Neurological: dizziness                 Physical Exam: @VITALS2 @ Constitutional: Well nourished and well developed . No acute distress.  ENT:. The ears and nose are normal in appearance.  Neck: The appearance of the neck is normal and no neck mass is present.  Pulmonary: No respiratory distress and normal respiratory rhythm and effort.  Cardiovascular: Heart rate and rhythm are normal . No peripheral edema.  Abdomen: The abdomen is flat. The abdomen is  soft and nontender. No masses are palpated. No CVA tenderness. No hernias are palpable. No hepatosplenomegaly noted.  Rectal: Rectal exam demonstrates residual hemorrhoidal skin tags noted, but normal sphincter tone, the anus is normal on inspection., no tenderness and no masses. Prostate size is estimated to be 40 g. Normal rectal tone, no rectal masses, prostate is smooth, symmetric and non-tender. The prostate has no nodularity and is not tender. The left seminal vesicle is nonpalpable. The right seminal vesicle is nonpalpable. The perineum is normal on inspection.   Studies:  No results for input(s): HGB, WBC, PLT in the last 72 hours.  No results for input(s): NA, K, CL, CO2, BUN, CREATININE, CALCIUM, GFRNONAA, GFRAA in the last 72 hours.  Invalid input(s): MAGNESIUM   No results for input(s): INR, APTT in the last 72 hours.  Invalid input(s): PT   Invalid input(s): ABG    Assessment:   Adenocarcinoma of the prostate.  Clinical stage TIc, PSA 17.89, prostatic volume 55 mL, most cores Gleason 3+4 = 7.  Bob Maldonado has initiated ADT and has completed 25 treatments of EBRT suture. Bob Maldonado presents for I125 brachii therapy .    Plan:  I-125 brachytherapy

## 2014-04-06 ENCOUNTER — Telehealth (HOSPITAL_COMMUNITY): Payer: Self-pay | Admitting: Specialist

## 2014-04-06 ENCOUNTER — Encounter (HOSPITAL_BASED_OUTPATIENT_CLINIC_OR_DEPARTMENT_OTHER): Payer: Self-pay | Admitting: Urology

## 2014-04-06 ENCOUNTER — Ambulatory Visit (HOSPITAL_COMMUNITY): Payer: BLUE CROSS/BLUE SHIELD | Admitting: Specialist

## 2014-04-06 NOTE — Telephone Encounter (Signed)
He is radio-active for 2 weeks and can not come in until March 2.

## 2014-04-09 ENCOUNTER — Ambulatory Visit (HOSPITAL_COMMUNITY): Payer: BLUE CROSS/BLUE SHIELD | Admitting: Specialist

## 2014-04-11 ENCOUNTER — Encounter (HOSPITAL_COMMUNITY): Payer: BLUE CROSS/BLUE SHIELD

## 2014-04-13 ENCOUNTER — Encounter (HOSPITAL_COMMUNITY): Payer: BLUE CROSS/BLUE SHIELD

## 2014-04-14 ENCOUNTER — Other Ambulatory Visit: Payer: Self-pay | Admitting: Family Medicine

## 2014-04-16 ENCOUNTER — Encounter (HOSPITAL_COMMUNITY): Payer: BLUE CROSS/BLUE SHIELD | Admitting: Specialist

## 2014-04-16 NOTE — Telephone Encounter (Signed)
Refill appropriate and filled per protocol. 

## 2014-04-18 ENCOUNTER — Ambulatory Visit (HOSPITAL_COMMUNITY): Payer: BLUE CROSS/BLUE SHIELD | Admitting: Occupational Therapy

## 2014-04-20 ENCOUNTER — Telehealth (HOSPITAL_COMMUNITY): Payer: Self-pay | Admitting: Specialist

## 2014-04-20 ENCOUNTER — Ambulatory Visit (HOSPITAL_COMMUNITY): Payer: BLUE CROSS/BLUE SHIELD | Admitting: Specialist

## 2014-04-23 ENCOUNTER — Other Ambulatory Visit: Payer: Self-pay | Admitting: Family Medicine

## 2014-04-23 ENCOUNTER — Encounter: Payer: Self-pay | Admitting: Radiation Oncology

## 2014-04-23 ENCOUNTER — Telehealth: Payer: Self-pay | Admitting: *Deleted

## 2014-04-23 NOTE — Telephone Encounter (Signed)
CALLED PATIENT TO REMIND OF APPTS. FOR 04-24-14, SPOKE WITH PATIENT AND HE IS AWARE OF THESE APPTS.

## 2014-04-24 ENCOUNTER — Ambulatory Visit: Payer: BLUE CROSS/BLUE SHIELD | Admitting: Orthopedic Surgery

## 2014-04-24 ENCOUNTER — Ambulatory Visit
Admit: 2014-04-24 | Discharge: 2014-04-24 | Disposition: A | Discharge status: Home / Self Care | Payer: BLUE CROSS/BLUE SHIELD | Attending: Radiation Oncology | Admitting: Radiation Oncology

## 2014-04-24 ENCOUNTER — Other Ambulatory Visit: Payer: Self-pay | Admitting: Radiation Oncology

## 2014-04-24 ENCOUNTER — Ambulatory Visit (INDEPENDENT_AMBULATORY_CARE_PROVIDER_SITE_OTHER): Payer: BLUE CROSS/BLUE SHIELD | Admitting: Urology

## 2014-04-24 ENCOUNTER — Encounter: Payer: Self-pay | Admitting: Radiation Oncology

## 2014-04-24 ENCOUNTER — Ambulatory Visit
Admission: RE | Admit: 2014-04-24 | Discharge: 2014-04-24 | Disposition: A | Discharge status: Home / Self Care | Payer: BLUE CROSS/BLUE SHIELD | Source: Ambulatory Visit | Attending: Radiation Oncology | Admitting: Radiation Oncology

## 2014-04-24 VITALS — BP 122/66 | HR 56 | Temp 98.1°F | Ht 76.0 in | Wt 226.0 lb

## 2014-04-24 DIAGNOSIS — C61 Malignant neoplasm of prostate: Secondary | ICD-10-CM

## 2014-04-24 DIAGNOSIS — Z510 Encounter for antineoplastic radiation therapy: Secondary | ICD-10-CM | POA: Diagnosis not present

## 2014-04-24 HISTORY — DX: Personal history of irradiation: Z92.3

## 2014-04-24 MED ORDER — HYDROCODONE-ACETAMINOPHEN 5-325 MG PO TABS: 1.0000 | ORAL_TABLET | Freq: Four times a day (QID) | ORAL | Status: DC | PRN

## 2014-04-24 NOTE — Telephone Encounter (Signed)
Medication filled x1 with no refills.   Requires office visit before any further refills can be given.   Letter sent.  

## 2014-04-24 NOTE — Progress Notes (Signed)
CC: Dr. 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  He saw Dr. Diona Fanti this morning and had another Depo-Lupron injection.  Since his seed implant boost he has been having more hesitancy, and dribbling.  He also has occasional perineal discomfort for which he has taken on the average of one hydrocodone tablet a day.  He was written a prescription for tamsulosin.  He forgot to ask Dr. Diona Fanti for renewal of his hydrocodone/APAP.  No new GI difficulties, he does not have a daily bowel movement.  His CT scan today shows what appears to be an excellent seed distribution.  Physical examination: Alert and oriented. Filed Vitals:   04/24/14 1130  BP: 122/66  Pulse: 56  Temp: 98.1 F (36.7 C)   Rectal examination not performed today.  Impression: Satisfactory progress with some degree of obstructive symptomatology which will hopefully improve with tamsulosin.  I renewed his hydrocodone/APAP (5/325) prescription.  His CT scan shows what appears to be an excellent seed distribution, and we will move ahead with his post implant dosimetry and forward the results of Dr. Diona Fanti.  Dr. Diona Fanti will continue with his androgen deprivation therapy for up to 2 years.  Plan: As above.  Follow-up through Dr. Diona Fanti.  I ask that Dr. Diona Fanti keep me posted on his progress.

## 2014-04-24 NOTE — Progress Notes (Signed)
Mr Girgenti reports hesistancy in urine stream, with incomplete emptying and "mild sensation" when voiding.  Deniess nocturia.  Denies any rectal irritation.

## 2014-04-24 NOTE — Progress Notes (Signed)
Complex simulation note: The patient was taken to the CT simulator and placed supine.  His pelvis was scanned.  The CT data set was sent to the planning system (MIM) where contoured his prostate and rectum.  He is now ready for post implant dosimetry to assess the quality of his implant.

## 2014-04-25 ENCOUNTER — Encounter: Payer: Self-pay | Admitting: Radiation Oncology

## 2014-04-25 DIAGNOSIS — Z510 Encounter for antineoplastic radiation therapy: Secondary | ICD-10-CM | POA: Diagnosis not present

## 2014-04-25 NOTE — Progress Notes (Signed)
CC: Dr. Franchot Gallo  Post-implant CT dosimetry note: The patient completed his post implant CT dosimetry to assess the quality of his prostate seed implant boost.  Dose volume histograms were obtained for the prostate and rectum.  His intraoperative prostate volume by ultrasound was 37.8 mL and his postoperative prostate volume by CT was 37.9 mL, an excellent correlation.  His prostate D90 is 108.7% and his V100 is 94.8%,, both excellent. Only 0.05 mL of rectum received the prescribed boost dose of 11,000 cGy.  In summary, Bob Maldonado  has excellent post implant dosimetry with a low risk for late rectal toxicity.

## 2014-04-27 ENCOUNTER — Telehealth: Payer: Self-pay | Admitting: Family Medicine

## 2014-04-27 NOTE — Telephone Encounter (Signed)
Patients wife sheila calling to speak to you regarding a insurance paper that dr Buelah Manis is supposed to complete for him  Would like for you to call her at [phone] or Lucious's phone number is [phone]

## 2014-05-02 NOTE — Telephone Encounter (Signed)
Dr. Buelah Manis did find the paperwork and pt's wife was notified that it would be filled out but could take until the end of next week.

## 2014-05-02 NOTE — Telephone Encounter (Signed)
I have not received any insurance papers recently

## 2014-05-02 NOTE — Telephone Encounter (Signed)
Do you know what form he is referring to?

## 2014-05-03 ENCOUNTER — Encounter (HOSPITAL_COMMUNITY): Payer: Self-pay

## 2014-05-03 NOTE — Therapy (Signed)
[place], [place], [address] Phone: [phone]   Fax:  [phone]  Patient Details  Name: Bob Maldonado MRN: [id number] Date of Birth: 28-Jun-1955 Referring Provider:  No ref. provider found  Encounter Date: 05/03/2014 OCCUPATIONAL THERAPY DISCHARGE SUMMARY  Visits from Start of Care: 34  Current functional level related to goals / functional outcomes: OT LONG TERM GOAL #1    Title Pt will acheive highest level of functioning in all ADL, IADL, work, and leisure activies.   Status On-going   OT LONG TERM GOAL #2   Title Pt will achieve BUE shoulder AROM WFL for improved ability to play a round of golf   OT LONG TERM GOAL #3   Title Pt will decrease fascial restrictions in BUE to minimal or less for decreased overall pain   OT LONG TERM GOAL #4   Title Pt will improve strength in BUE to 5/5 for improved ability to engage in overhead reaching tasks   Status Partially Met   OT LONG TERM GOAL #5   Title Pt will have pain in BUE of less than 2/10 when donning a coat   Status On-going          Remaining deficits: Pt cancelled remaining therapy appts stating that he had to have clearance from radiation MD. Patient has not returned to therapy since 04/03/14.    Education / Equipment: AROM and general strengthening exercises Plan: Patient agrees to discharge.  Patient goals were partially met. Patient is being discharged due to not returning since the last visit.  ?????       Ailene Ravel, OTR/L,CBIS  [phone]  05/03/2014, 4:04 PM  [facility] [address] [place], [place], [address] Phone: [phone]   Fax:  [phone]

## 2014-06-05 ENCOUNTER — Ambulatory Visit (INDEPENDENT_AMBULATORY_CARE_PROVIDER_SITE_OTHER): Payer: BLUE CROSS/BLUE SHIELD | Admitting: Urology

## 2014-06-05 DIAGNOSIS — C61 Malignant neoplasm of prostate: Secondary | ICD-10-CM

## 2014-06-12 ENCOUNTER — Ambulatory Visit: Payer: BLUE CROSS/BLUE SHIELD | Admitting: Orthopedic Surgery

## 2014-06-26 ENCOUNTER — Ambulatory Visit (INDEPENDENT_AMBULATORY_CARE_PROVIDER_SITE_OTHER): Payer: BLUE CROSS/BLUE SHIELD | Admitting: Orthopedic Surgery

## 2014-06-26 ENCOUNTER — Encounter: Payer: Self-pay | Admitting: Orthopedic Surgery

## 2014-06-26 VITALS — BP 116/71 | Ht 76.0 in | Wt 226.0 lb

## 2014-06-26 DIAGNOSIS — M75102 Unspecified rotator cuff tear or rupture of left shoulder, not specified as traumatic: Secondary | ICD-10-CM | POA: Diagnosis not present

## 2014-06-26 DIAGNOSIS — M75101 Unspecified rotator cuff tear or rupture of right shoulder, not specified as traumatic: Secondary | ICD-10-CM | POA: Diagnosis not present

## 2014-06-26 NOTE — Patient Instructions (Signed)
We will schedule MRI  for you and call you with results  Joint Injection Care After Refer to this sheet in the next few days. These instructions provide you with information on caring for yourself after you have had a joint injection. Your caregiver also may give you more specific instructions. Your treatment has been planned according to current medical practices, but problems sometimes occur. Call your caregiver if you have any problems or questions after your procedure. After any type of joint injection, it is not uncommon to experience:  Soreness, swelling, or bruising around the injection site.  Mild numbness, tingling, or weakness around the injection site caused by the numbing medicine used before or with the injection. It also is possible to experience the following effects associated with the specific agent after injection:  Iodine-based contrast agents:  Allergic reaction (itching, hives, widespread redness, and swelling beyond the injection site).  Corticosteroids (These effects are rare.):  Allergic reaction.  Increased blood sugar levels (If you have diabetes and you notice that your blood sugar levels have increased, notify your caregiver).  Increased blood pressure levels.  Mood swings.  Hyaluronic acid in the use of viscosupplementation.  Temporary heat or redness.  Temporary rash and itching.  Increased fluid accumulation in the injected joint. These effects all should resolve within a day after your procedure.  HOME CARE INSTRUCTIONS  Limit yourself to light activity the day of your procedure. Avoid lifting heavy objects, bending, stooping, or twisting.  Take prescription or over-the-counter pain medication as directed by your caregiver.  You may apply ice to your injection site to reduce pain and swelling the day of your procedure. Ice may be applied 03-04 times:  Put ice in a plastic bag.  Place a towel between your skin and the bag.  Leave the ice on  for no longer than 15-20 minutes each time. SEEK IMMEDIATE MEDICAL CARE IF:   Pain and swelling get worse rather than better or extend beyond the injection site.  Numbness does not go away.  Blood or fluid continues to leak from the injection site.  You have chest pain.  You have swelling of your face or tongue.  You have trouble breathing or you become dizzy.  You develop a fever, chills, or severe tenderness at the injection site that last longer than 1 day. MAKE SURE YOU:  Understand these instructions.  Watch your condition.  Get help right away if you are not doing well or if you get worse. Document Released: 10/16/2010 Document Revised: 04/27/2011 Document Reviewed: 10/16/2010 [facility] Patient Information 2015 [place], [place]. This information is not intended to replace advice given to you by your health care provider. Make sure you discuss any questions you have with your health care provider.

## 2014-06-26 NOTE — Progress Notes (Signed)
Chief Complaint  Patient presents with  . Follow-up    2 month follow up bilateral shoulder pain s/p med+PT    BP 116/71 mmHg  Ht 6\' 4"  (1.93 m)  Wt 226 lb (102.513 kg)  BMI 27.52 kg/m2  History the patient has been treated for shoulder pain with injection and physical therapy in the midst of ongoing radiation treatment. He is on dialysis with a left AV fistula in the left arm. He complains of continued pain and stiffness in the left and right shoulder. He did get some mild relief from injection and improved range of motion in the left and right shoulder  Review of systems neurologic symptoms appear normal no pain is noted below the elbow.  Review of physical exam he has 110 of active for elevation of the right less than 90 on the left. He has tenderness globally around the left shoulder anterior and posterior joint lines with external rotation limited to 0 on the left and 45 on the right. The right shows a mild impingement sign on the left we couldn't get him in the impinging position  We recommend that he have an MRI to document the status of his rotator cuff. He has AV fistula left so this limits our treatment options at this point is also in the midst of finishing up his radiation treatment for his prostate cancer which has gone well  Recommend holding therapy until we document the status of his rotator cuff left shoulder. If he has an adhesive capsulitis we will continue shoulder therapy to break up those adhesions and be very cautious proceeding with any surgical intervention since the access for his dialysis is on left arm.  We repeated both injections.   Procedure note the subacromial injection shoulder left   Verbal consent was obtained to inject the  Left   Shoulder  Timeout was completed to confirm the injection site is a subacromial space of the  left  shoulder  Medication used Depo-Medrol 40 mg and lidocaine 1% 3 cc  Anesthesia was provided by ethyl chloride  The  injection was performed in the left  posterior subacromial space. After pinning the skin with alcohol and anesthetized the skin with ethyl chloride the subacromial space was injected using a 20-gauge needle. There were no complications  Sterile dressing was applied.   Procedure note the subacromial injection shoulder RIGHT  Verbal consent was obtained to inject the  RIGHT   Shoulder  Timeout was completed to confirm the injection site is a subacromial space of the  RIGHT  shoulder   Medication used Depo-Medrol 40 mg and lidocaine 1% 3 cc  Anesthesia was provided by ethyl chloride  The injection was performed in the RIGHT  posterior subacromial space. After pinning the skin with alcohol and anesthetized the skin with ethyl chloride the subacromial space was injected using a 20-gauge needle. There were no complications  Sterile dressing was applied.

## 2014-06-29 ENCOUNTER — Encounter: Payer: Self-pay | Admitting: Internal Medicine

## 2014-07-11 ENCOUNTER — Ambulatory Visit (HOSPITAL_COMMUNITY): Payer: BLUE CROSS/BLUE SHIELD

## 2014-07-12 ENCOUNTER — Encounter: Payer: Self-pay | Admitting: Nurse Practitioner

## 2014-07-12 ENCOUNTER — Encounter (INDEPENDENT_AMBULATORY_CARE_PROVIDER_SITE_OTHER): Payer: Self-pay

## 2014-07-12 ENCOUNTER — Ambulatory Visit (INDEPENDENT_AMBULATORY_CARE_PROVIDER_SITE_OTHER): Payer: BLUE CROSS/BLUE SHIELD | Admitting: Nurse Practitioner

## 2014-07-12 VITALS — BP 101/63 | HR 67 | Temp 98.5°F | Ht 76.0 in | Wt 221.4 lb

## 2014-07-12 DIAGNOSIS — K5900 Constipation, unspecified: Secondary | ICD-10-CM | POA: Diagnosis not present

## 2014-07-12 DIAGNOSIS — K649 Unspecified hemorrhoids: Secondary | ICD-10-CM

## 2014-07-12 MED ORDER — HYDROCORTISONE ACETATE 25 MG RE SUPP: 25.0000 mg | Freq: Two times a day (BID) | RECTAL | Status: DC

## 2014-07-12 NOTE — Patient Instructions (Signed)
1. I send in a prescription for Anusol suppositories that she can take twice a day for up to 10 days at a time as needed for recurrent hemorrhoid symptoms. 2. We will make sure you are on the recall list for repeat colonoscopy one is due in 2017. 3. Return as needed for any worsening or change in her symptoms.

## 2014-07-12 NOTE — Progress Notes (Signed)
CC'ED TO PCP 

## 2014-07-12 NOTE — Assessment & Plan Note (Signed)
Patient with a seven-month history of constipation including hard, large stools which are very difficult to pass and subsequent hemorrhoid symptoms including rectal pain irritation, hemorrhoidal protrusions, and toilet tissue hematochezia. He attributed his new onset constipation to a change in his hemodialysis mix which resulted and dehydration and constipation. His next is since been changed back to weeks ago and has had resolution of his symptoms. We'll provide with Anusol suppository prescription to keep on hand for any exacerbation or recurrence of his symptoms. We'll ensure he is on the neck list for his routine screening colonoscopy and 2017. Return for further evaluation as needed for worsening symptoms.

## 2014-07-12 NOTE — Progress Notes (Signed)
Primary Care Physician:  Vic Blackbird, MD Primary Gastroenterologist:  Dr. Gala Romney  Chief Complaint  Patient presents with  . Hemorrhoids    bleeding    HPI:   59 year old male referred to [place] by [facility] for bleeding hemorrhoids. Last colonoscopy in 2007 found normal rectum, normal colon, likely constipation. Kidney center notes reviewed.  Today he states he has had constipation for 6-7 months when they changed his phosphate binding agents. He would become very constipated to where the stool was so large he was unable to pass it without great difficulty. At that point he began having hemorrhoid issues. Symptoms included protrusion, irritation, pain, itching and bleeding. He also was having toilet tissue hematochezia. His HD center has changed his binding agent a couple weeks ago and symptoms have resolved. His stools are much softer and smaller. Has a bowel movement every day to every other day currently. While he was symptomatic he was having a bowel movement every 3-4 days. Denies any other upper or lower GI symptoms.   Past Medical History  Diagnosis Date  . Hypertension   . Hyperlipidemia   . Gout     per pt stable as of 03-30-2014  . Arthritis   . Prostate cancer first dx in 03/16/11, 08/29/13    Gleason 7, volume 55 mL (urologist-  dr Diona Fanti)  treatment External Beam Radiation therapy  . Type 2 diabetes mellitus   . GERD (gastroesophageal reflux disease)   . Hemorrhoid   . ESRD on hemodialysis     Nephrologist-- Dr Eddie Dibbles--  [facility] in [place]-- M/W/F  . Wears glasses   . At risk for sleep apnea     STOP-BANG= 5      SENT PCP 03-30-2014  . S/P radiation therapy 02/13/2014 through 03/21/2014     Prostate/seminal vesicles 4500 cGy in 25 sessions     Past Surgical History  Procedure Laterality Date  . Insertion of dialysis catheter Right 05/30/2013    Procedure: INSERTION OF  DIALYSIS CATHETER;  Surgeon: Conrad Imlay, MD;  Location: [facility];  Service: Vascular;  Laterality: Right;  . Av fistula placement Left 05/30/2013    Procedure: Brachial vein transposition 1st stage;  Surgeon: Conrad Gahanna, MD;  Location: [facility];  Service: Vascular;  Laterality: Left;  . Colonoscopy  06-01-2005  . Bascilic vein transposition Left 07/25/2013    Procedure: SECOND STAGE BRACHIAL VEIN TRANSPOSITION;  Surgeon: Conrad Fulton, MD;  Location: [facility];  Service: Vascular;  Laterality: Left;  . Prostate biopsy  last one 08/29/13    Gleason 7, volume 55 mL  . Knee arthroscopy Right 1985  . Radioactive seed implant N/A 04/05/2014    Procedure: RADIOACTIVE SEED IMPLANT;  Surgeon: Jorja Loa, MD;  Location[facility];  Service: Urology;  Laterality: N/A;  60  SEEDS IMPLANTED  NO SEEDS FOUND IN BLADDER    Current Outpatient Prescriptions  Medication Sig Dispense Refill  . cetirizine (ZYRTEC) 10 MG tablet Take 10 mg by mouth daily.    . cloNIDine (CATAPRES) 0.1 MG tablet Take 1 tablet (0.1 mg total) by mouth 2 (two) times daily. (Patient taking differently: Take 0.1 mg by mouth every evening. ) 180 tablet 2  . colchicine 0.6 MG tablet Take 1 tablet (0.6 mg total) by mouth daily. 90 tablet 2  . HYDROcodone-acetaminophen (NORCO) 5-325 MG per tablet Take 1 tablet by mouth every 6 (six) hours as needed for moderate pain. 30 tablet  0  . lanthanum (FOSRENOL) 1000 MG chewable tablet Chew 1,000 mg by mouth 3 (three) times daily with meals.    . metoprolol succinate (TOPROL-XL) 100 MG 24 hr tablet TAKE 1 TABLET BY MOUTH EVERY DAY WITH FOOD 90 tablet 2  . mometasone (NASONEX) 50 MCG/ACT nasal spray Place 2 sprays into the nose daily. (Patient taking differently: Place 2 sprays into the nose daily as needed. ) 17 g 12  . Multiple Vitamin (RENAL MULTIVITAMIN/ZINC PO) Take 1 tablet by mouth daily.    Marland Kitchen omeprazole (PRILOSEC) 20 MG capsule Take 20 mg by mouth every morning.    . pravastatin  (PRAVACHOL) 40 MG tablet TAKE 1 TABLET BY MOUTH EVERY DAY 90 tablet 0  . sucroferric oxyhydroxide (VELPHORO) 500 MG chewable tablet Chew 500 mg by mouth.    . tamsulosin (FLOMAX) 0.4 MG CAPS capsule     . Tetrahydrozoline HCl (VISINE OP) Place 2 drops into both eyes 2 (two) times daily as needed (dry eyes).    . Insulin Glargine (LANTUS) 100 UNIT/ML Solostar Pen Inject 10 Units into the skin at bedtime. (Patient not taking: Reported on 04/24/2014) 15 mL 3  . loratadine (CLARITIN) 10 MG tablet Take 10 mg by mouth daily as needed.     . polyethylene glycol (MIRALAX / GLYCOLAX) packet Take 17 g by mouth daily as needed.     Addison Lank Hazel (PREPARATION H EX) Apply topically as needed.     No current facility-administered medications for this visit.    Allergies as of 07/12/2014  . (No Known Allergies)    Family History  Problem Relation Age of Onset  . Diabetes Mother   . Heart disease Mother   . Heart attack Mother   . Cancer Father   . Heart disease Brother     History   Social History  . Marital Status: Married    Spouse Name: N/A  . Number of Children: N/A  . Years of Education: N/A   Occupational History  . Not on file.   Social History Main Topics  . Smoking status: Former Smoker -- 1.00 packs/day for 20 years    Types: Cigarettes    Quit date: 09/27/1990  . Smokeless tobacco: Never Used  . Alcohol Use: No     Comment: rare  . Drug Use: No  . Sexual Activity: Not on file   Other Topics Concern  . Not on file   Social History Narrative    Review of Systems: General: Negative for anorexia, weight loss, fever, chills, fatigue, weakness. Eyes: Negative for vision changes.  ENT: Negative for hoarseness, difficulty swallowing. CV: Negative for chest pain, angina, palpitations, peripheral edema.  Respiratory: Negative for dyspnea at rest, cough, wheezing.  GI: See history of present illness. Derm: Negative for rash or itching.  Neuro: Negative for weakness, memory  loss, confusion.  Psych: Negative for anxiety, depression.  Endo: Negative for unusual weight change.  Heme: Negative for bruising or bleeding. Allergy: Negative for rash or hives.    Physical Exam: BP 101/63 mmHg  Pulse 67  Temp(Src) 98.5 F (36.9 C)  Ht 6\' 4"  (1.93 m)  Wt 221 lb 6.4 oz (100.426 kg)  BMI 26.96 kg/m2 General:   Alert and oriented. Pleasant and cooperative. Well-nourished and well-developed.  Head:  Normocephalic and atraumatic. Eyes:  Without icterus, sclera clear and conjunctiva pink.  Ears:  Normal auditory acuity. Cardiovascular:  S1, S2 present without murmurs appreciated. Normal pulses noted. Extremities without clubbing or edema.  Respiratory:  Clear to auscultation bilaterally. No wheezes, rales, or rhonchi. No distress.  Gastrointestinal:  +BS, soft, non-tender and non-distended. No HSM noted. No guarding or rebound. No masses appreciated.  Rectal:  Deferred  Skin:  Intact without significant lesions or rashes. AV fistula noted to left upper arm +bruit and thrill. Neurologic:  Alert and oriented x4;  grossly normal neurologically. Psych:  Alert and cooperative. Normal mood and affect. Heme/Lymph/Immune: No excessive bruising noted.    07/12/2014 9:30 AM

## 2014-07-14 ENCOUNTER — Other Ambulatory Visit: Payer: Self-pay | Admitting: Family Medicine

## 2014-07-17 ENCOUNTER — Ambulatory Visit: Payer: BLUE CROSS/BLUE SHIELD | Admitting: Nurse Practitioner

## 2014-07-17 NOTE — Telephone Encounter (Signed)
Refill appropriate and filled per protocol. 

## 2014-07-20 ENCOUNTER — Ambulatory Visit (HOSPITAL_COMMUNITY): Admission: RE | Admit: 2014-07-20 | Payer: BLUE CROSS/BLUE SHIELD | Source: Ambulatory Visit

## 2014-07-27 ENCOUNTER — Ambulatory Visit (HOSPITAL_COMMUNITY): Payer: BLUE CROSS/BLUE SHIELD | Attending: Orthopedic Surgery

## 2014-10-02 ENCOUNTER — Ambulatory Visit (INDEPENDENT_AMBULATORY_CARE_PROVIDER_SITE_OTHER): Payer: BLUE CROSS/BLUE SHIELD | Admitting: Urology

## 2014-10-02 DIAGNOSIS — C61 Malignant neoplasm of prostate: Secondary | ICD-10-CM | POA: Diagnosis not present

## 2014-12-31 ENCOUNTER — Other Ambulatory Visit (HOSPITAL_COMMUNITY): Payer: Self-pay | Admitting: Nephrology

## 2014-12-31 ENCOUNTER — Ambulatory Visit (HOSPITAL_COMMUNITY)
Admission: RE | Admit: 2014-12-31 | Discharge: 2014-12-31 | Disposition: A | Discharge status: Home / Self Care | Payer: BLUE CROSS/BLUE SHIELD | Source: Ambulatory Visit | Attending: Nephrology | Admitting: Nephrology

## 2014-12-31 DIAGNOSIS — R059 Cough, unspecified: Secondary | ICD-10-CM

## 2014-12-31 DIAGNOSIS — R05 Cough: Secondary | ICD-10-CM

## 2015-01-29 ENCOUNTER — Ambulatory Visit (INDEPENDENT_AMBULATORY_CARE_PROVIDER_SITE_OTHER): Payer: BLUE CROSS/BLUE SHIELD | Admitting: Urology

## 2015-01-29 DIAGNOSIS — N320 Bladder-neck obstruction: Secondary | ICD-10-CM

## 2015-01-29 DIAGNOSIS — C61 Malignant neoplasm of prostate: Secondary | ICD-10-CM | POA: Diagnosis not present

## 2015-01-29 DIAGNOSIS — N401 Enlarged prostate with lower urinary tract symptoms: Secondary | ICD-10-CM | POA: Diagnosis not present

## 2015-05-03 ENCOUNTER — Other Ambulatory Visit: Payer: Self-pay | Admitting: Family Medicine

## 2015-05-06 ENCOUNTER — Encounter: Payer: Self-pay | Admitting: *Deleted

## 2015-05-06 NOTE — Telephone Encounter (Signed)
Medication filled x1 with no refills.   Requires office visit before any further refills can be given.   Letter sent.  

## 2015-05-13 ENCOUNTER — Telehealth: Payer: Self-pay | Admitting: Internal Medicine

## 2015-05-13 NOTE — Telephone Encounter (Signed)
RECALL FOR TCS °

## 2015-05-14 ENCOUNTER — Encounter: Payer: Self-pay | Admitting: Family Medicine

## 2015-05-14 ENCOUNTER — Ambulatory Visit (INDEPENDENT_AMBULATORY_CARE_PROVIDER_SITE_OTHER): Payer: PRIVATE HEALTH INSURANCE | Admitting: Family Medicine

## 2015-05-14 VITALS — BP 138/80 | HR 78 | Temp 98.5°F | Resp 16 | Ht 76.0 in | Wt 234.0 lb

## 2015-05-14 DIAGNOSIS — E785 Hyperlipidemia, unspecified: Secondary | ICD-10-CM | POA: Diagnosis not present

## 2015-05-14 DIAGNOSIS — N185 Chronic kidney disease, stage 5: Secondary | ICD-10-CM | POA: Diagnosis not present

## 2015-05-14 DIAGNOSIS — M1A09X0 Idiopathic chronic gout, multiple sites, without tophus (tophi): Secondary | ICD-10-CM | POA: Diagnosis not present

## 2015-05-14 DIAGNOSIS — Z1211 Encounter for screening for malignant neoplasm of colon: Secondary | ICD-10-CM | POA: Diagnosis not present

## 2015-05-14 DIAGNOSIS — R531 Weakness: Secondary | ICD-10-CM

## 2015-05-14 DIAGNOSIS — N186 End stage renal disease: Secondary | ICD-10-CM | POA: Diagnosis not present

## 2015-05-14 DIAGNOSIS — E118 Type 2 diabetes mellitus with unspecified complications: Secondary | ICD-10-CM | POA: Diagnosis not present

## 2015-05-14 DIAGNOSIS — Z992 Dependence on renal dialysis: Secondary | ICD-10-CM | POA: Diagnosis not present

## 2015-05-14 DIAGNOSIS — D631 Anemia in chronic kidney disease: Secondary | ICD-10-CM

## 2015-05-14 DIAGNOSIS — I10 Essential (primary) hypertension: Secondary | ICD-10-CM | POA: Diagnosis not present

## 2015-05-14 MED ORDER — COLCHICINE 0.6 MG PO TABS: 0.6000 mg | ORAL_TABLET | Freq: Every day | ORAL | Status: DC

## 2015-05-14 NOTE — Assessment & Plan Note (Signed)
Return for lipid panel, he stopped statins

## 2015-05-14 NOTE — Assessment & Plan Note (Signed)
Continue with colchicine he takes once a day

## 2015-05-14 NOTE — Telephone Encounter (Signed)
Letter mailed

## 2015-05-14 NOTE — Progress Notes (Signed)
Patient ID: Bob Maldonado, male   DOB: 1955/10/27, 60 y.o.   MRN: [id number]   Subjective:    Patient ID: Bob Maldonado, male    DOB: 03/02/1955, 60 y.o.   MRN: [id number]  Patient presents for F/U Patient here for follow-up. He was last seen in 2015. He has history of prostate cancer which he has completed radiation treatment with urology. He also had a Lupron injections.  Also follows with nephrology because of end-stage renal disease he is on hemodialysis 3 times a week.  He also gastroenterology for chronic constipation and hemorrhoids.  He is also seen by orthopedics last year because of rotator cuff tear.  His history of diabetes mellitus but after his renal function worse and he has not required any treatment for diabetes mellitus. I do not have any recent A1c on him. He was also prescribed blood pressure medication as well as cholesterol medication,He is off of all these medications  Eye doctor- Dr. Zadie Rhine  Review Of Systems:  GEN- denies fatigue, fever, weight loss,weakness, recent illness HEENT- denies eye drainage, change in vision, nasal discharge, CVS- denies chest pain, palpitations RESP- denies SOB, cough, wheeze ABD- denies N/V, change in stools, abd pain GU- denies dysuria, hematuria, dribbling, incontinence MSK- denies joint pain, muscle aches, injury Neuro- denies headache, dizziness, syncope, seizure activity       Objective:    BP 138/80 mmHg  Pulse 78  Temp(Src) 98.5 F (36.9 C) (Oral)  Resp 16  Ht 6\' 4"  (1.93 m)  Wt 234 lb (106.142 kg)  BMI 28.50 kg/m2 GEN- NAD, alert and oriented x3 HEENT- PERRL, EOMI, non injected sclera, pink conjunctiva, MMM, oropharynx clear CVS- RRR, 2/6 SEM  RESP-CTAB  EXT- No edema, LUE Fistula + bruit Pulses- Radial, DP- 2+        Assessment & Plan:      Problem List Items Addressed This Visit    Hyperlipidemia    Return for lipid panel, he stopped statins      Gout    Continue with colchicine he takes  once a day      Essential hypertension, benign    Pressure controlled off medication      ESRD on dialysis ([place])   Diabetes mellitus with complication ([place]) - Primary    His diabetes has been diet controlled since he has been on dialysis I will get the last A1c from his nephrologist      Anemia of chronic renal failure    Other Visit Diagnoses    End stage renal disease ([place])        Generalized weakness           Note: This dictation was prepared with Dragon dictation along with smaller phrase technology. Any transcriptional errors that result from this process are unintentional.

## 2015-05-14 NOTE — Assessment & Plan Note (Signed)
Pressure controlled off medication

## 2015-05-14 NOTE — Patient Instructions (Addendum)
F/U 6 months Return for cholesterol screening  Colonoscopy referral  Call Dr. Zadie Rhine for eye doctor

## 2015-05-14 NOTE — Assessment & Plan Note (Signed)
His diabetes has been diet controlled since he has been on dialysis I will get the last A1c from his nephrologist

## 2015-05-15 NOTE — Addendum Note (Signed)
Addended by: Vic Blackbird F on: 05/15/2015 02:36 PM   Modules accepted: Orders

## 2015-05-21 ENCOUNTER — Other Ambulatory Visit: Payer: BLUE CROSS/BLUE SHIELD

## 2015-05-21 DIAGNOSIS — E118 Type 2 diabetes mellitus with unspecified complications: Secondary | ICD-10-CM

## 2015-05-21 DIAGNOSIS — E785 Hyperlipidemia, unspecified: Secondary | ICD-10-CM

## 2015-05-22 LAB — COMPREHENSIVE METABOLIC PANEL
ALT: 8 U/L — AB (ref 9–46)
AST: 11 U/L (ref 10–35)
Albumin: 4.1 g/dL (ref 3.6–5.1)
Alkaline Phosphatase: 155 U/L — ABNORMAL HIGH (ref 40–115)
BUN: 43 mg/dL — AB (ref 7–25)
CO2: 26 mmol/L (ref 20–31)
Calcium: 8.4 mg/dL — ABNORMAL LOW (ref 8.6–10.3)
Chloride: 96 mmol/L — ABNORMAL LOW (ref 98–110)
Creat: 9.59 mg/dL — ABNORMAL HIGH (ref 0.70–1.25)
Glucose, Bld: 87 mg/dL (ref 70–99)
Potassium: 4.1 mmol/L (ref 3.5–5.3)
SODIUM: 140 mmol/L (ref 135–146)
Total Bilirubin: 0.6 mg/dL (ref 0.2–1.2)
Total Protein: 7.1 g/dL (ref 6.1–8.1)

## 2015-05-22 LAB — LIPID PANEL
CHOL/HDL RATIO: 3.9 ratio (ref <=5.0)
Cholesterol: 185 mg/dL (ref 125–200)
HDL: 47 mg/dL (ref >=40)
LDL CALC: 107 mg/dL (ref <130)
Triglycerides: 154 mg/dL — ABNORMAL HIGH (ref <150)
VLDL: 31 mg/dL — ABNORMAL HIGH (ref <30)

## 2015-05-22 LAB — HEMOGLOBIN A1C
Hgb A1c MFr Bld: 6.5 % — ABNORMAL HIGH (ref <5.7)
Mean Plasma Glucose: 140 mg/dL

## 2015-06-18 ENCOUNTER — Ambulatory Visit (INDEPENDENT_AMBULATORY_CARE_PROVIDER_SITE_OTHER): Payer: PRIVATE HEALTH INSURANCE | Admitting: Urology

## 2015-06-18 DIAGNOSIS — R300 Dysuria: Secondary | ICD-10-CM | POA: Diagnosis not present

## 2015-06-18 DIAGNOSIS — N320 Bladder-neck obstruction: Secondary | ICD-10-CM

## 2015-06-18 DIAGNOSIS — N401 Enlarged prostate with lower urinary tract symptoms: Secondary | ICD-10-CM

## 2015-06-18 DIAGNOSIS — C61 Malignant neoplasm of prostate: Secondary | ICD-10-CM | POA: Diagnosis not present

## 2015-10-29 ENCOUNTER — Ambulatory Visit (INDEPENDENT_AMBULATORY_CARE_PROVIDER_SITE_OTHER): Payer: PRIVATE HEALTH INSURANCE | Admitting: Urology

## 2015-10-29 DIAGNOSIS — C61 Malignant neoplasm of prostate: Secondary | ICD-10-CM | POA: Diagnosis not present

## 2015-11-19 ENCOUNTER — Ambulatory Visit: Payer: BLUE CROSS/BLUE SHIELD | Admitting: Family Medicine

## 2015-12-02 ENCOUNTER — Encounter: Payer: Self-pay | Admitting: Family Medicine

## 2015-12-02 ENCOUNTER — Ambulatory Visit (INDEPENDENT_AMBULATORY_CARE_PROVIDER_SITE_OTHER): Payer: PRIVATE HEALTH INSURANCE | Admitting: Family Medicine

## 2015-12-02 VITALS — BP 162/88 | HR 74 | Temp 99.4°F | Resp 18 | Ht 76.0 in | Wt 236.0 lb

## 2015-12-02 DIAGNOSIS — J189 Pneumonia, unspecified organism: Secondary | ICD-10-CM

## 2015-12-02 MED ORDER — LEVOFLOXACIN 500 MG PO TABS: ORAL_TABLET | ORAL | 0 refills | Status: DC

## 2015-12-02 NOTE — Patient Instructions (Signed)
Get the chest xray  Take the medication as prescribed Okay to use the tussionex F/U 1 month for diabetes

## 2015-12-02 NOTE — Progress Notes (Signed)
   Subjective:    Patient ID: Bob Maldonado, male    DOB: 04/04/1955, 60 y.o.   MRN: [id number]  Patient presents for Illness (x3 days- productive cough with green colored mucus- low grade fever)  Patient here with cough with production worsening over the past 3 days he's had low-grade fevers and shortness of breath and wheezing. He's also had some nasal congestion that preceded this.His wife was recently hospitalized with pneumonia. He has not taken anything over-the-counter. He was at dialysis today states that they put oxygen on him at 4 L and told him to either go to the emergency room or come here he has to decide to just walking with his wife and hour before closing time.  He also has underlying prostate cancer with no active treatment currently he's diabetic he has not been checking his blood sugars he is not on any current medication.   Review Of Systems:  GEN- + fatigue, +fever, weight loss,weakness, recent illness HEENT- denies eye drainage, change in vision, nasal discharge, CVS- denies chest pain, palpitations RESP-+ SOB,+ cough, +wheeze ABD- denies N/V, change in stools, abd pain GU- denies dysuria, hematuria, dribbling, incontinence MSK- denies joint pain, muscle aches, injury Neuro- denies headache, dizziness, syncope, seizure activity       Objective:    BP (!) 162/88 (BP Location: Right Arm, Patient Position: Sitting, Cuff Size: Normal)   Pulse 74   Temp 99.4 F (37.4 C) (Oral)   Resp 18   Ht 6\' 4"  (1.93 m)   Wt 236 lb (107 kg)   SpO2 96% Comment: RA  BMI 28.73 kg/m  GEN- NAD, alert and oriented x3, non toxic appearing  HEENT- PERRL, EOMI, non injected sclera, pink conjunctiva, MMM, oropharynx  Mild injection, no exudates  nares clear, no maxillary sinus tenderness, TM clear bilat  Neck- Supple, shotty ant LAD  CVS- RRR, no murmur RESP- soft rales left base, no wheeze, normal WOB, sat 96%  EXT- No edema Pulses- Radial 2+        Assessment & Plan:       Problem List Items Addressed This Visit    None    Visit Diagnoses    Community acquired pneumonia, unspecified laterality    -  Primary   Concern for CAP based on symptoms, immunocompromised state, obtain CXR, he is at dry weight. Start levaquin every 48 hours x 5 doses, has tussionex cough syrup  sats normal, go to ER if he worsens    Relevant Medications   levofloxacin (LEVAQUIN) 500 MG tablet   Other Relevant Orders   DG Chest 2 View      Note: This dictation was prepared with Dragon dictation along with smaller phrase technology. Any transcriptional errors that result from this process are unintentional.

## 2015-12-03 ENCOUNTER — Ambulatory Visit: Payer: PRIVATE HEALTH INSURANCE | Admitting: Family Medicine

## 2015-12-03 ENCOUNTER — Encounter: Payer: Self-pay | Admitting: Family Medicine

## 2015-12-04 ENCOUNTER — Ambulatory Visit (HOSPITAL_COMMUNITY)
Admission: RE | Admit: 2015-12-04 | Discharge: 2015-12-04 | Disposition: A | Discharge status: Home / Self Care | Payer: Medicare Other | Source: Ambulatory Visit | Attending: Family Medicine | Admitting: Family Medicine

## 2015-12-04 DIAGNOSIS — R05 Cough: Secondary | ICD-10-CM | POA: Diagnosis present

## 2015-12-04 DIAGNOSIS — J189 Pneumonia, unspecified organism: Secondary | ICD-10-CM

## 2015-12-04 DIAGNOSIS — R0602 Shortness of breath: Secondary | ICD-10-CM | POA: Diagnosis present

## 2015-12-04 DIAGNOSIS — J90 Pleural effusion, not elsewhere classified: Secondary | ICD-10-CM | POA: Diagnosis not present

## 2015-12-20 IMAGING — CT CT ABD-PELV W/O CM
2 of 4 series · 16 of 46 positions shown, 18 images · non-contrast
Comparison: Bone scan 08/22/2013

CLINICAL DATA: Prostate cancer.  End-stage renal disease

EXAM:
CT ABDOMEN AND PELVIS WITHOUT CONTRAST
TECHNIQUE: Multidetector CT imaging of the abdomen and pelvis was performed
following the standard protocol without IV contrast.

[Series 2: abdomen/pelvis w/o contrast · axial · non-contrast · 0.87mm/px · z∈[-484,-4]mm · 13 of 106 slices shown, 15 images]
[im 5/106  soft-tissue]
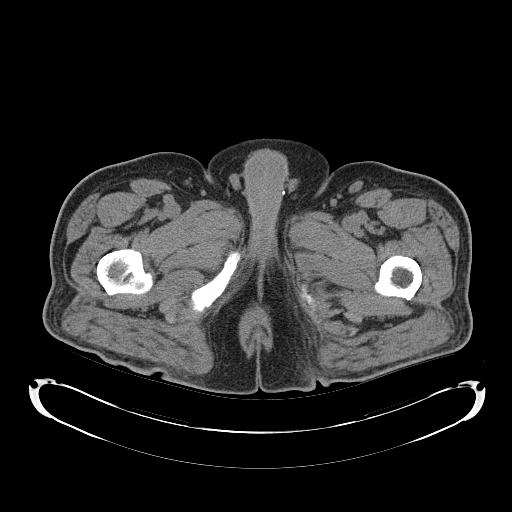
[im 5/106  bone]
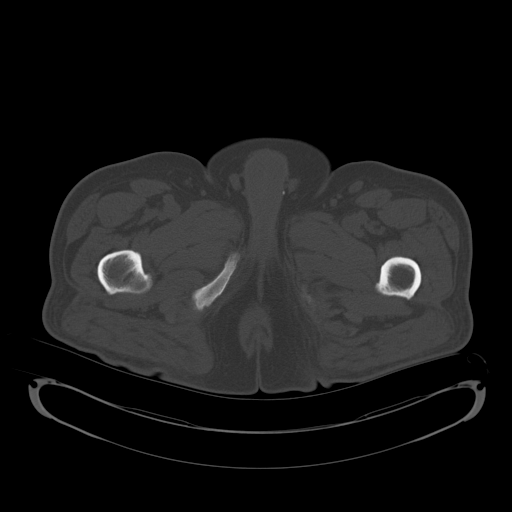
[im 14/106  soft-tissue]
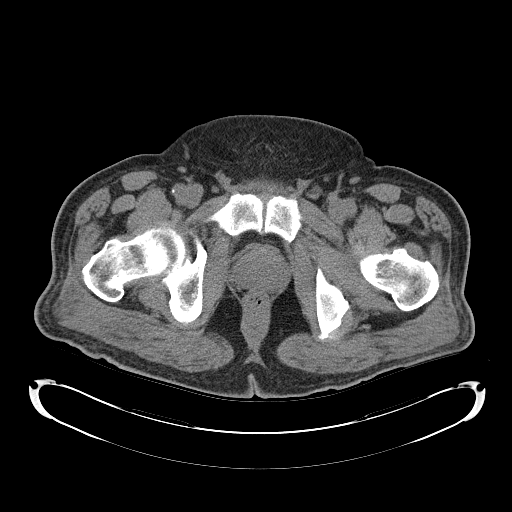
[im 22/106  soft-tissue]
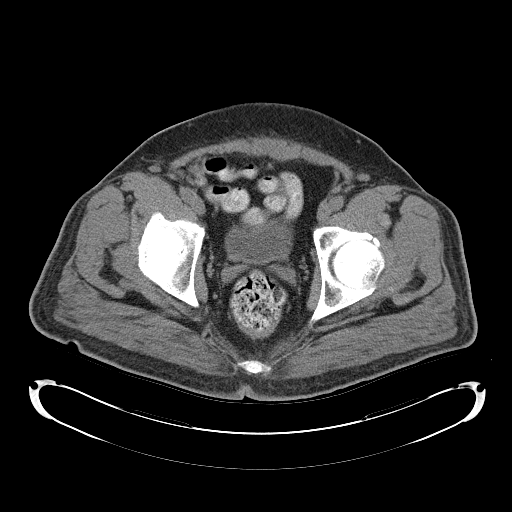
[im 31/106  soft-tissue]
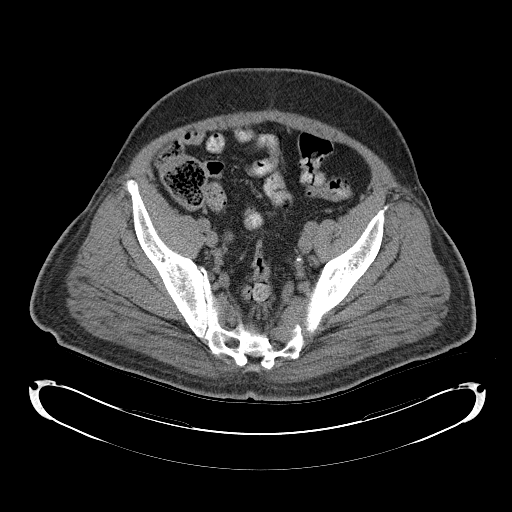
[im 36/106  soft-tissue]
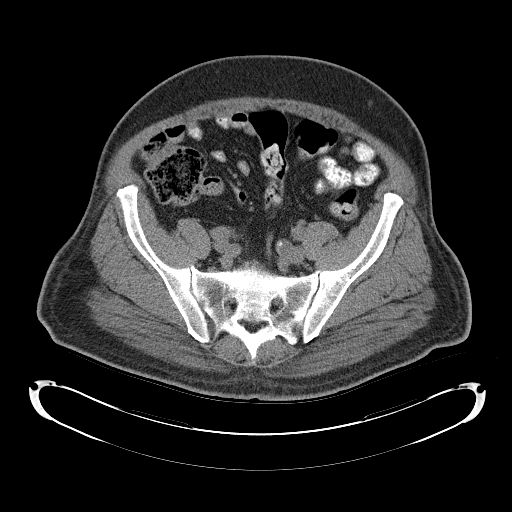
[im 44/106  soft-tissue]
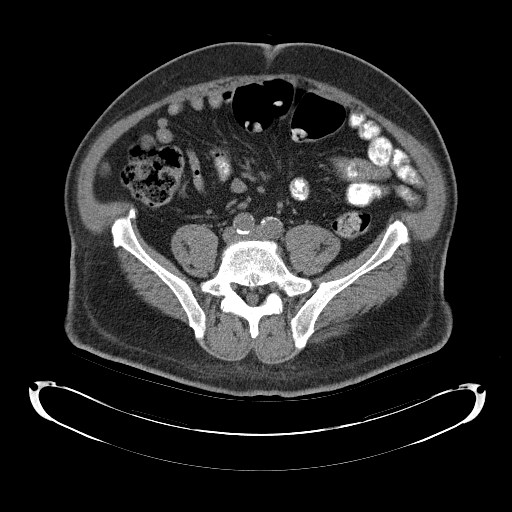
[im 53/106  soft-tissue]
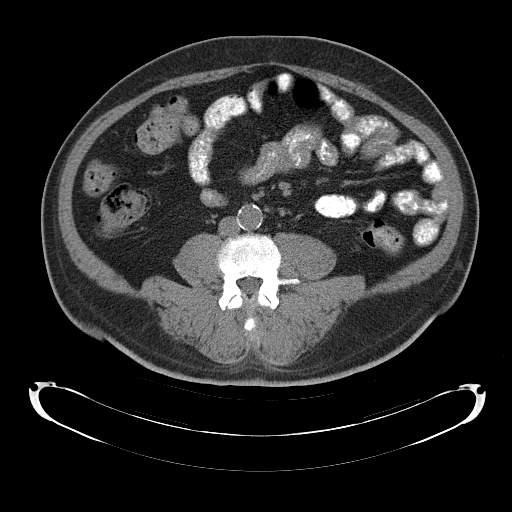
[im 62/106  soft-tissue]
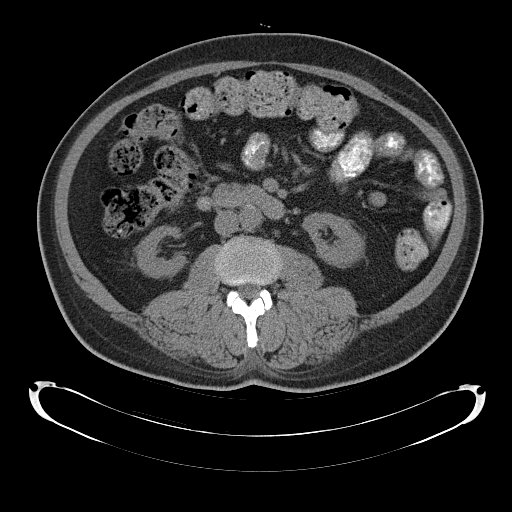
[im 71/106  soft-tissue]
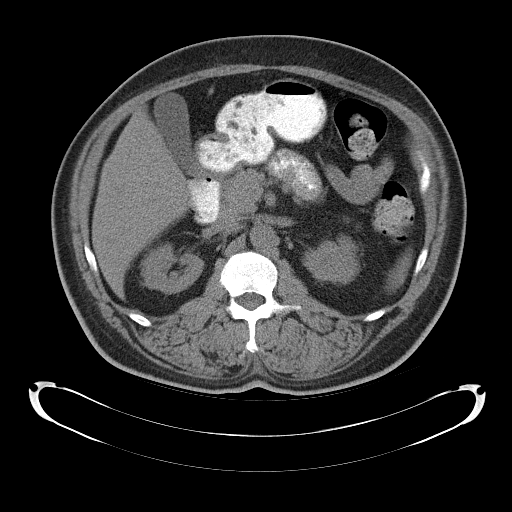
[im 71/106  bone]
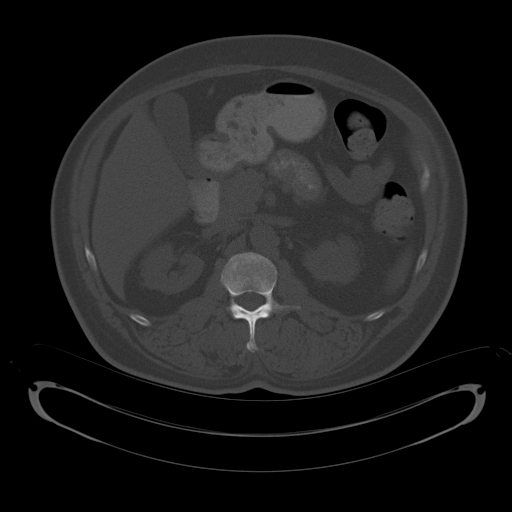
[im 75/106  soft-tissue]
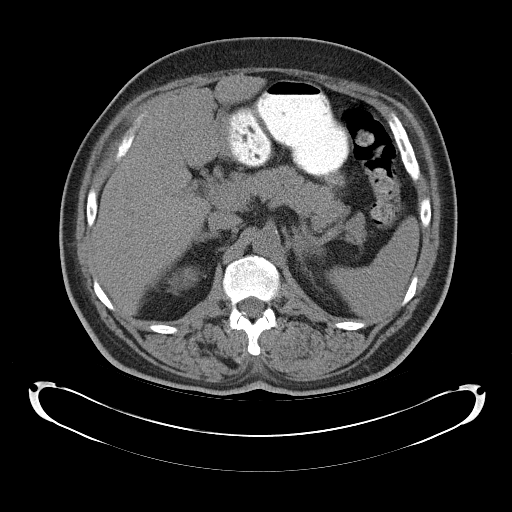
[im 84/106  soft-tissue]
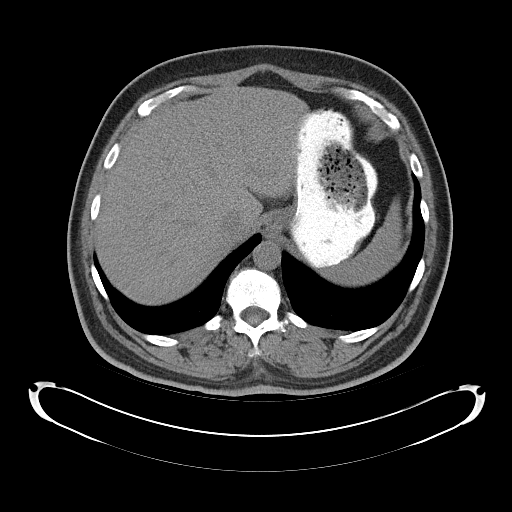
[im 92/106  soft-tissue]
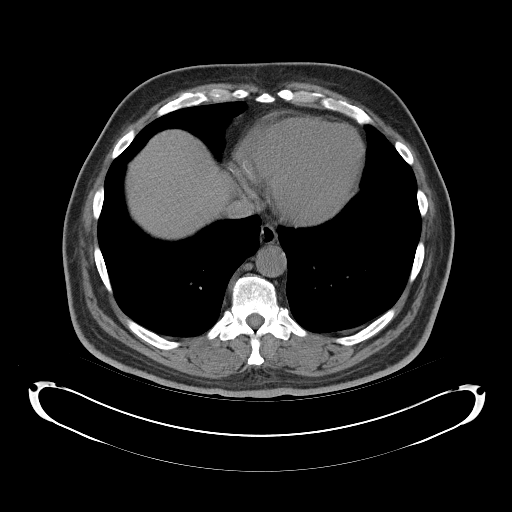
[im 101/106  soft-tissue]
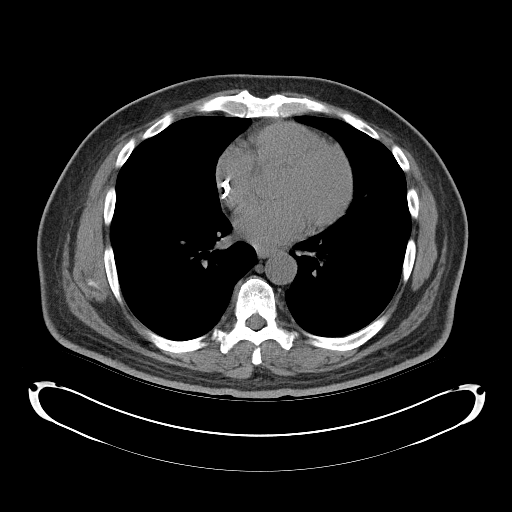

[Series 3: mpr cor (id) · coronal · 0.78mm/px · 3 of 109 slices shown]
[im 37/109  soft-tissue]
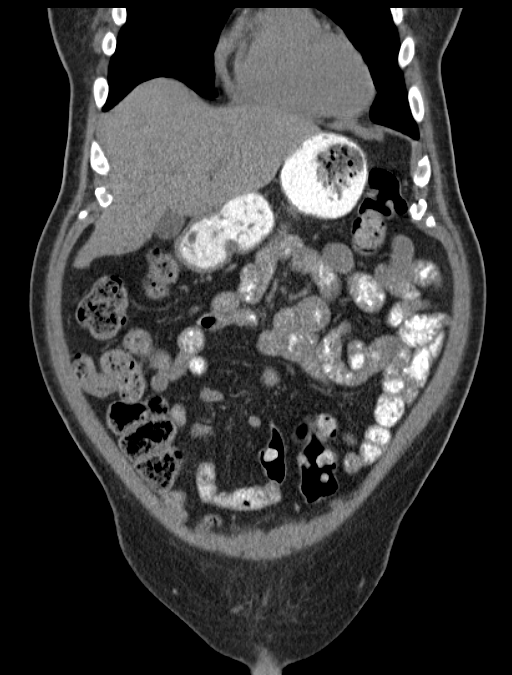
[im 49/109  soft-tissue]
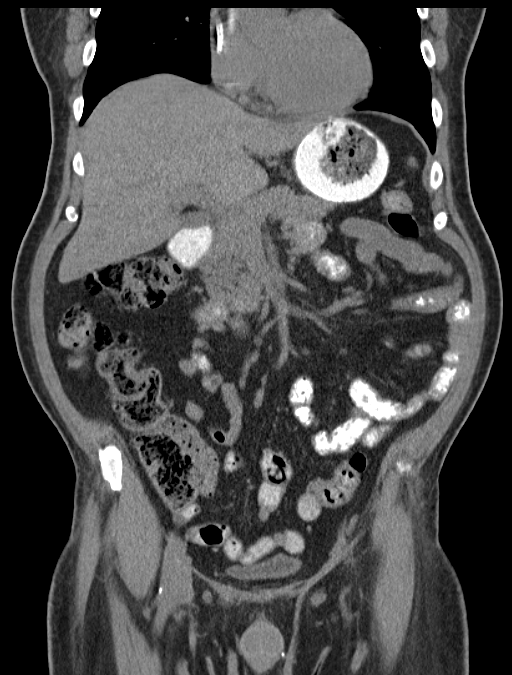
[im 61/109  soft-tissue]
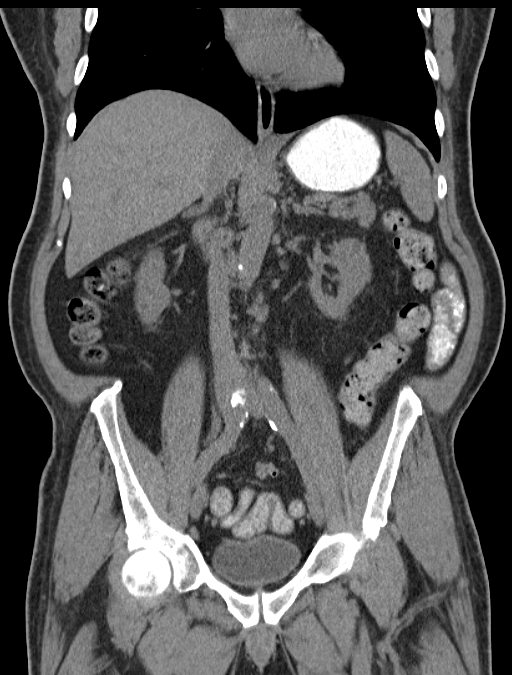

[16 of 46 positions shown; findings below may reference images not displayed]

FINDINGS: Lung bases are clear.  No pericardial fluid.

No focal hepatic lesions on this non contrast exam. The gallbladder,
pancreas, spleen, adrenal glands, kidneys are unremarkable. The
kidneys appears small.

The stomach, small bowel new, and cecum normal. The colon and
rectosigmoid colon are normal.

Abdominal aorta is normal caliber. No retroperitoneal periportal
lymphadenopathy. No central mesenteric adenopathy.

Prostate gland and bladder normal. No pelvic lymphadenopathy. There
is small right common iliac lymph node measuring 8 mm (image 67,
series 2).

Review of the bone windows demonstrates no lytic or sclerotic
lesions.
IMPRESSION: 1. No evidence of prostate cancer metastasis on this noncontrast
exam.
2. Small right common iliac lymph node is not pathologic by size
criteria
3. No evidence skeletal metastasis.

## 2016-01-02 ENCOUNTER — Other Ambulatory Visit: Payer: Self-pay | Admitting: *Deleted

## 2016-01-02 DIAGNOSIS — M79675 Pain in left toe(s): Secondary | ICD-10-CM

## 2016-01-07 ENCOUNTER — Ambulatory Visit: Payer: Self-pay | Admitting: Family Medicine

## 2016-01-13 ENCOUNTER — Ambulatory Visit: Payer: Medicare Other | Admitting: Family Medicine

## 2016-01-16 DIAGNOSIS — N186 End stage renal disease: Secondary | ICD-10-CM | POA: Diagnosis not present

## 2016-01-16 DIAGNOSIS — E1129 Type 2 diabetes mellitus with other diabetic kidney complication: Secondary | ICD-10-CM | POA: Diagnosis not present

## 2016-01-16 DIAGNOSIS — Z992 Dependence on renal dialysis: Secondary | ICD-10-CM | POA: Diagnosis not present

## 2016-01-20 ENCOUNTER — Encounter: Payer: Self-pay | Admitting: Family Medicine

## 2016-01-20 ENCOUNTER — Ambulatory Visit (INDEPENDENT_AMBULATORY_CARE_PROVIDER_SITE_OTHER): Payer: Medicare Other | Admitting: Family Medicine

## 2016-01-20 VITALS — BP 136/68 | HR 68 | Temp 98.4°F | Resp 16 | Ht 76.0 in | Wt 239.0 lb

## 2016-01-20 DIAGNOSIS — Z992 Dependence on renal dialysis: Secondary | ICD-10-CM

## 2016-01-20 DIAGNOSIS — E7800 Pure hypercholesterolemia, unspecified: Secondary | ICD-10-CM

## 2016-01-20 DIAGNOSIS — E118 Type 2 diabetes mellitus with unspecified complications: Secondary | ICD-10-CM

## 2016-01-20 DIAGNOSIS — Z1211 Encounter for screening for malignant neoplasm of colon: Secondary | ICD-10-CM | POA: Diagnosis not present

## 2016-01-20 DIAGNOSIS — I10 Essential (primary) hypertension: Secondary | ICD-10-CM | POA: Diagnosis not present

## 2016-01-20 DIAGNOSIS — K5900 Constipation, unspecified: Secondary | ICD-10-CM | POA: Diagnosis not present

## 2016-01-20 DIAGNOSIS — N186 End stage renal disease: Secondary | ICD-10-CM

## 2016-01-20 NOTE — Patient Instructions (Addendum)
Colonoscopy referral Get the cholesterol done fasting at [facility] Continue current medication  Miralax 1 cap full  Sign release of records- General surgery [place]  F/u 6 Months

## 2016-01-20 NOTE — Assessment & Plan Note (Addendum)
Diet controlled obtain labs from dialysis

## 2016-01-20 NOTE — Assessment & Plan Note (Signed)
Trial of OTC Miralax

## 2016-01-20 NOTE — Assessment & Plan Note (Signed)
Bp looks okay, will defer to renal to start Bp meds advised to monitor at home

## 2016-01-20 NOTE — Assessment & Plan Note (Signed)
Pt to get fasting lipid

## 2016-01-20 NOTE — Progress Notes (Signed)
   Subjective:    Patient ID: Bob Maldonado, male    DOB: Mar 11, 1955, 60 y.o.   MRN: [id number]  Patient presents for Follow-up (is not fasting)  Patient here to follow-up chronic medical problems. He has no new concerns today. He is currently in hemodialysis he gets this 3 times a week. It has been noted that his blood pressures been a little high his nephrologist discussed starting a blood pressure medicine on his off dialysis days but this has not been done yet.  History of diabetes mellitus his last A1c was 6.7% this is done at his dialysis center quarterly. He is not on any medications for this.  Hyperlipidemia he is due for repeat cholesterol level. We tried to manage with his diet however he has gained some weight. Ex  He was seen by surgeon in [place] with concern for peripheral arterial disease but was told that he did not have any problems he sent for ultrasound was not even done.  Flu shot done   Urology- only on surveillance for his previous prostate cancer which has been treated   He has had some problems with constipation he does sickest stool softener but knows that that is not working as well. He denies any blood in the stool. He is overdue for his colonoscopy  Review Of Systems:  GEN- denies fatigue, fever, weight loss,weakness, recent illness HEENT- denies eye drainage, change in vision, nasal discharge, CVS- denies chest pain, palpitations RESP- denies SOB, cough, wheeze ABD- denies N/V, change in stools, abd pain GU- denies dysuria, hematuria, dribbling, incontinence MSK- denies joint pain, muscle aches, injury Neuro- denies headache, dizziness, syncope, seizure activity       Objective:    BP 136/68 (BP Location: Right Arm, Patient Position: Sitting, Cuff Size: Large)   Pulse 68   Temp 98.4 F (36.9 C) (Oral)   Resp 16   Ht 6\' 4"  (1.93 m)   Wt 239 lb (108.4 kg)   SpO2 99%   BMI 29.09 kg/m  GEN- NAD, alert and oriented x3 HEENT- PERRL, EOMI, non  injected sclera, pink conjunctiva, MMM, oropharynx clear CVS- RRR, no murmur RESP-CTAB ABD-NABS,soft,NT,ND EXT- No edema Pulses- Radial 2+        Assessment & Plan:      Problem List Items Addressed This Visit    Hyperlipidemia - Primary    Pt to get fasting lipid      Relevant Orders   Lipid panel   Essential hypertension, benign    Bp looks okay, will defer to renal to start Bp meds advised to monitor at home      ESRD on dialysis [facility])   Diabetes mellitus with complication ([place])    Diet controlled obtain labs from dialysis      Constipation    Trial of OTC Miralax         Note: This dictation was prepared with Dragon dictation along with smaller phrase technology. Any transcriptional errors that result from this process are unintentional.

## 2016-01-23 ENCOUNTER — Encounter: Payer: Self-pay | Admitting: *Deleted

## 2016-02-07 ENCOUNTER — Encounter: Payer: Self-pay | Admitting: Vascular Surgery

## 2016-02-16 DIAGNOSIS — E1129 Type 2 diabetes mellitus with other diabetic kidney complication: Secondary | ICD-10-CM | POA: Diagnosis not present

## 2016-02-16 DIAGNOSIS — N186 End stage renal disease: Secondary | ICD-10-CM | POA: Diagnosis not present

## 2016-02-16 DIAGNOSIS — Z992 Dependence on renal dialysis: Secondary | ICD-10-CM | POA: Diagnosis not present

## 2016-02-19 DIAGNOSIS — N2581 Secondary hyperparathyroidism of renal origin: Secondary | ICD-10-CM | POA: Diagnosis not present

## 2016-02-19 DIAGNOSIS — N186 End stage renal disease: Secondary | ICD-10-CM | POA: Diagnosis not present

## 2016-02-19 DIAGNOSIS — E1129 Type 2 diabetes mellitus with other diabetic kidney complication: Secondary | ICD-10-CM | POA: Diagnosis not present

## 2016-02-20 ENCOUNTER — Ambulatory Visit (HOSPITAL_COMMUNITY): Admission: RE | Admit: 2016-02-20 | Payer: BLUE CROSS/BLUE SHIELD | Source: Ambulatory Visit

## 2016-02-20 ENCOUNTER — Encounter: Payer: BLUE CROSS/BLUE SHIELD | Admitting: Vascular Surgery

## 2016-02-21 DIAGNOSIS — N186 End stage renal disease: Secondary | ICD-10-CM | POA: Diagnosis not present

## 2016-02-21 DIAGNOSIS — N2581 Secondary hyperparathyroidism of renal origin: Secondary | ICD-10-CM | POA: Diagnosis not present

## 2016-02-21 DIAGNOSIS — E1129 Type 2 diabetes mellitus with other diabetic kidney complication: Secondary | ICD-10-CM | POA: Diagnosis not present

## 2016-02-24 DIAGNOSIS — N186 End stage renal disease: Secondary | ICD-10-CM | POA: Diagnosis not present

## 2016-02-24 DIAGNOSIS — N2581 Secondary hyperparathyroidism of renal origin: Secondary | ICD-10-CM | POA: Diagnosis not present

## 2016-02-24 DIAGNOSIS — E1129 Type 2 diabetes mellitus with other diabetic kidney complication: Secondary | ICD-10-CM | POA: Diagnosis not present

## 2016-02-26 DIAGNOSIS — E1129 Type 2 diabetes mellitus with other diabetic kidney complication: Secondary | ICD-10-CM | POA: Diagnosis not present

## 2016-02-26 DIAGNOSIS — N2581 Secondary hyperparathyroidism of renal origin: Secondary | ICD-10-CM | POA: Diagnosis not present

## 2016-02-26 DIAGNOSIS — N186 End stage renal disease: Secondary | ICD-10-CM | POA: Diagnosis not present

## 2016-02-28 DIAGNOSIS — N2581 Secondary hyperparathyroidism of renal origin: Secondary | ICD-10-CM | POA: Diagnosis not present

## 2016-02-28 DIAGNOSIS — N186 End stage renal disease: Secondary | ICD-10-CM | POA: Diagnosis not present

## 2016-02-28 DIAGNOSIS — E1129 Type 2 diabetes mellitus with other diabetic kidney complication: Secondary | ICD-10-CM | POA: Diagnosis not present

## 2016-03-02 DIAGNOSIS — N186 End stage renal disease: Secondary | ICD-10-CM | POA: Diagnosis not present

## 2016-03-02 DIAGNOSIS — N2581 Secondary hyperparathyroidism of renal origin: Secondary | ICD-10-CM | POA: Diagnosis not present

## 2016-03-02 DIAGNOSIS — E1129 Type 2 diabetes mellitus with other diabetic kidney complication: Secondary | ICD-10-CM | POA: Diagnosis not present

## 2016-03-04 DIAGNOSIS — N186 End stage renal disease: Secondary | ICD-10-CM | POA: Diagnosis not present

## 2016-03-04 DIAGNOSIS — E1129 Type 2 diabetes mellitus with other diabetic kidney complication: Secondary | ICD-10-CM | POA: Diagnosis not present

## 2016-03-04 DIAGNOSIS — N2581 Secondary hyperparathyroidism of renal origin: Secondary | ICD-10-CM | POA: Diagnosis not present

## 2016-03-06 DIAGNOSIS — N2581 Secondary hyperparathyroidism of renal origin: Secondary | ICD-10-CM | POA: Diagnosis not present

## 2016-03-06 DIAGNOSIS — N186 End stage renal disease: Secondary | ICD-10-CM | POA: Diagnosis not present

## 2016-03-06 DIAGNOSIS — E1129 Type 2 diabetes mellitus with other diabetic kidney complication: Secondary | ICD-10-CM | POA: Diagnosis not present

## 2016-03-09 DIAGNOSIS — N186 End stage renal disease: Secondary | ICD-10-CM | POA: Diagnosis not present

## 2016-03-09 DIAGNOSIS — E1129 Type 2 diabetes mellitus with other diabetic kidney complication: Secondary | ICD-10-CM | POA: Diagnosis not present

## 2016-03-09 DIAGNOSIS — N2581 Secondary hyperparathyroidism of renal origin: Secondary | ICD-10-CM | POA: Diagnosis not present

## 2016-03-11 DIAGNOSIS — N186 End stage renal disease: Secondary | ICD-10-CM | POA: Diagnosis not present

## 2016-03-11 DIAGNOSIS — N2581 Secondary hyperparathyroidism of renal origin: Secondary | ICD-10-CM | POA: Diagnosis not present

## 2016-03-11 DIAGNOSIS — E1129 Type 2 diabetes mellitus with other diabetic kidney complication: Secondary | ICD-10-CM | POA: Diagnosis not present

## 2016-03-13 DIAGNOSIS — N2581 Secondary hyperparathyroidism of renal origin: Secondary | ICD-10-CM | POA: Diagnosis not present

## 2016-03-13 DIAGNOSIS — E1129 Type 2 diabetes mellitus with other diabetic kidney complication: Secondary | ICD-10-CM | POA: Diagnosis not present

## 2016-03-13 DIAGNOSIS — N186 End stage renal disease: Secondary | ICD-10-CM | POA: Diagnosis not present

## 2016-03-16 DIAGNOSIS — N186 End stage renal disease: Secondary | ICD-10-CM | POA: Diagnosis not present

## 2016-03-16 DIAGNOSIS — E1129 Type 2 diabetes mellitus with other diabetic kidney complication: Secondary | ICD-10-CM | POA: Diagnosis not present

## 2016-03-16 DIAGNOSIS — N2581 Secondary hyperparathyroidism of renal origin: Secondary | ICD-10-CM | POA: Diagnosis not present

## 2016-03-18 DIAGNOSIS — E1129 Type 2 diabetes mellitus with other diabetic kidney complication: Secondary | ICD-10-CM | POA: Diagnosis not present

## 2016-03-18 DIAGNOSIS — Z992 Dependence on renal dialysis: Secondary | ICD-10-CM | POA: Diagnosis not present

## 2016-03-18 DIAGNOSIS — N2581 Secondary hyperparathyroidism of renal origin: Secondary | ICD-10-CM | POA: Diagnosis not present

## 2016-03-18 DIAGNOSIS — N186 End stage renal disease: Secondary | ICD-10-CM | POA: Diagnosis not present

## 2016-03-20 DIAGNOSIS — N186 End stage renal disease: Secondary | ICD-10-CM | POA: Diagnosis not present

## 2016-03-20 DIAGNOSIS — N2581 Secondary hyperparathyroidism of renal origin: Secondary | ICD-10-CM | POA: Diagnosis not present

## 2016-03-20 DIAGNOSIS — E1129 Type 2 diabetes mellitus with other diabetic kidney complication: Secondary | ICD-10-CM | POA: Diagnosis not present

## 2016-03-20 DIAGNOSIS — D631 Anemia in chronic kidney disease: Secondary | ICD-10-CM | POA: Diagnosis not present

## 2016-03-23 DIAGNOSIS — N2581 Secondary hyperparathyroidism of renal origin: Secondary | ICD-10-CM | POA: Diagnosis not present

## 2016-03-23 DIAGNOSIS — D631 Anemia in chronic kidney disease: Secondary | ICD-10-CM | POA: Diagnosis not present

## 2016-03-23 DIAGNOSIS — N186 End stage renal disease: Secondary | ICD-10-CM | POA: Diagnosis not present

## 2016-03-23 DIAGNOSIS — E1129 Type 2 diabetes mellitus with other diabetic kidney complication: Secondary | ICD-10-CM | POA: Diagnosis not present

## 2016-03-25 DIAGNOSIS — E1129 Type 2 diabetes mellitus with other diabetic kidney complication: Secondary | ICD-10-CM | POA: Diagnosis not present

## 2016-03-25 DIAGNOSIS — D631 Anemia in chronic kidney disease: Secondary | ICD-10-CM | POA: Diagnosis not present

## 2016-03-25 DIAGNOSIS — N2581 Secondary hyperparathyroidism of renal origin: Secondary | ICD-10-CM | POA: Diagnosis not present

## 2016-03-25 DIAGNOSIS — N186 End stage renal disease: Secondary | ICD-10-CM | POA: Diagnosis not present

## 2016-03-27 DIAGNOSIS — N186 End stage renal disease: Secondary | ICD-10-CM | POA: Diagnosis not present

## 2016-03-27 DIAGNOSIS — E1129 Type 2 diabetes mellitus with other diabetic kidney complication: Secondary | ICD-10-CM | POA: Diagnosis not present

## 2016-03-27 DIAGNOSIS — D631 Anemia in chronic kidney disease: Secondary | ICD-10-CM | POA: Diagnosis not present

## 2016-03-27 DIAGNOSIS — N2581 Secondary hyperparathyroidism of renal origin: Secondary | ICD-10-CM | POA: Diagnosis not present

## 2016-03-30 DIAGNOSIS — N2581 Secondary hyperparathyroidism of renal origin: Secondary | ICD-10-CM | POA: Diagnosis not present

## 2016-03-30 DIAGNOSIS — D631 Anemia in chronic kidney disease: Secondary | ICD-10-CM | POA: Diagnosis not present

## 2016-03-30 DIAGNOSIS — N186 End stage renal disease: Secondary | ICD-10-CM | POA: Diagnosis not present

## 2016-03-30 DIAGNOSIS — E1129 Type 2 diabetes mellitus with other diabetic kidney complication: Secondary | ICD-10-CM | POA: Diagnosis not present

## 2016-04-01 DIAGNOSIS — N2581 Secondary hyperparathyroidism of renal origin: Secondary | ICD-10-CM | POA: Diagnosis not present

## 2016-04-01 DIAGNOSIS — D631 Anemia in chronic kidney disease: Secondary | ICD-10-CM | POA: Diagnosis not present

## 2016-04-01 DIAGNOSIS — E1129 Type 2 diabetes mellitus with other diabetic kidney complication: Secondary | ICD-10-CM | POA: Diagnosis not present

## 2016-04-01 DIAGNOSIS — N186 End stage renal disease: Secondary | ICD-10-CM | POA: Diagnosis not present

## 2016-04-03 DIAGNOSIS — N186 End stage renal disease: Secondary | ICD-10-CM | POA: Diagnosis not present

## 2016-04-03 DIAGNOSIS — D631 Anemia in chronic kidney disease: Secondary | ICD-10-CM | POA: Diagnosis not present

## 2016-04-03 DIAGNOSIS — N2581 Secondary hyperparathyroidism of renal origin: Secondary | ICD-10-CM | POA: Diagnosis not present

## 2016-04-03 DIAGNOSIS — E1129 Type 2 diabetes mellitus with other diabetic kidney complication: Secondary | ICD-10-CM | POA: Diagnosis not present

## 2016-04-06 DIAGNOSIS — N186 End stage renal disease: Secondary | ICD-10-CM | POA: Diagnosis not present

## 2016-04-06 DIAGNOSIS — E1129 Type 2 diabetes mellitus with other diabetic kidney complication: Secondary | ICD-10-CM | POA: Diagnosis not present

## 2016-04-06 DIAGNOSIS — N2581 Secondary hyperparathyroidism of renal origin: Secondary | ICD-10-CM | POA: Diagnosis not present

## 2016-04-06 DIAGNOSIS — D631 Anemia in chronic kidney disease: Secondary | ICD-10-CM | POA: Diagnosis not present

## 2016-04-08 DIAGNOSIS — E1129 Type 2 diabetes mellitus with other diabetic kidney complication: Secondary | ICD-10-CM | POA: Diagnosis not present

## 2016-04-08 DIAGNOSIS — N2581 Secondary hyperparathyroidism of renal origin: Secondary | ICD-10-CM | POA: Diagnosis not present

## 2016-04-08 DIAGNOSIS — D631 Anemia in chronic kidney disease: Secondary | ICD-10-CM | POA: Diagnosis not present

## 2016-04-08 DIAGNOSIS — N186 End stage renal disease: Secondary | ICD-10-CM | POA: Diagnosis not present

## 2016-04-10 DIAGNOSIS — E1129 Type 2 diabetes mellitus with other diabetic kidney complication: Secondary | ICD-10-CM | POA: Diagnosis not present

## 2016-04-10 DIAGNOSIS — N186 End stage renal disease: Secondary | ICD-10-CM | POA: Diagnosis not present

## 2016-04-10 DIAGNOSIS — D631 Anemia in chronic kidney disease: Secondary | ICD-10-CM | POA: Diagnosis not present

## 2016-04-10 DIAGNOSIS — N2581 Secondary hyperparathyroidism of renal origin: Secondary | ICD-10-CM | POA: Diagnosis not present

## 2016-04-13 DIAGNOSIS — N2581 Secondary hyperparathyroidism of renal origin: Secondary | ICD-10-CM | POA: Diagnosis not present

## 2016-04-13 DIAGNOSIS — D631 Anemia in chronic kidney disease: Secondary | ICD-10-CM | POA: Diagnosis not present

## 2016-04-13 DIAGNOSIS — E1129 Type 2 diabetes mellitus with other diabetic kidney complication: Secondary | ICD-10-CM | POA: Diagnosis not present

## 2016-04-13 DIAGNOSIS — N186 End stage renal disease: Secondary | ICD-10-CM | POA: Diagnosis not present

## 2016-04-15 DIAGNOSIS — D631 Anemia in chronic kidney disease: Secondary | ICD-10-CM | POA: Diagnosis not present

## 2016-04-15 DIAGNOSIS — N186 End stage renal disease: Secondary | ICD-10-CM | POA: Diagnosis not present

## 2016-04-15 DIAGNOSIS — Z992 Dependence on renal dialysis: Secondary | ICD-10-CM | POA: Diagnosis not present

## 2016-04-15 DIAGNOSIS — E1129 Type 2 diabetes mellitus with other diabetic kidney complication: Secondary | ICD-10-CM | POA: Diagnosis not present

## 2016-04-15 DIAGNOSIS — N2581 Secondary hyperparathyroidism of renal origin: Secondary | ICD-10-CM | POA: Diagnosis not present

## 2016-04-17 DIAGNOSIS — E1129 Type 2 diabetes mellitus with other diabetic kidney complication: Secondary | ICD-10-CM | POA: Diagnosis not present

## 2016-04-17 DIAGNOSIS — N2581 Secondary hyperparathyroidism of renal origin: Secondary | ICD-10-CM | POA: Diagnosis not present

## 2016-04-17 DIAGNOSIS — N186 End stage renal disease: Secondary | ICD-10-CM | POA: Diagnosis not present

## 2016-04-17 DIAGNOSIS — D631 Anemia in chronic kidney disease: Secondary | ICD-10-CM | POA: Diagnosis not present

## 2016-04-20 DIAGNOSIS — N2581 Secondary hyperparathyroidism of renal origin: Secondary | ICD-10-CM | POA: Diagnosis not present

## 2016-04-20 DIAGNOSIS — N186 End stage renal disease: Secondary | ICD-10-CM | POA: Diagnosis not present

## 2016-04-20 DIAGNOSIS — E1129 Type 2 diabetes mellitus with other diabetic kidney complication: Secondary | ICD-10-CM | POA: Diagnosis not present

## 2016-04-20 DIAGNOSIS — D631 Anemia in chronic kidney disease: Secondary | ICD-10-CM | POA: Diagnosis not present

## 2016-04-22 DIAGNOSIS — N186 End stage renal disease: Secondary | ICD-10-CM | POA: Diagnosis not present

## 2016-04-22 DIAGNOSIS — N2581 Secondary hyperparathyroidism of renal origin: Secondary | ICD-10-CM | POA: Diagnosis not present

## 2016-04-22 DIAGNOSIS — E1129 Type 2 diabetes mellitus with other diabetic kidney complication: Secondary | ICD-10-CM | POA: Diagnosis not present

## 2016-04-22 DIAGNOSIS — D631 Anemia in chronic kidney disease: Secondary | ICD-10-CM | POA: Diagnosis not present

## 2016-04-23 DIAGNOSIS — D631 Anemia in chronic kidney disease: Secondary | ICD-10-CM | POA: Diagnosis not present

## 2016-04-23 DIAGNOSIS — N2581 Secondary hyperparathyroidism of renal origin: Secondary | ICD-10-CM | POA: Diagnosis not present

## 2016-04-23 DIAGNOSIS — N186 End stage renal disease: Secondary | ICD-10-CM | POA: Diagnosis not present

## 2016-04-23 DIAGNOSIS — E1129 Type 2 diabetes mellitus with other diabetic kidney complication: Secondary | ICD-10-CM | POA: Diagnosis not present

## 2016-04-27 DIAGNOSIS — N2581 Secondary hyperparathyroidism of renal origin: Secondary | ICD-10-CM | POA: Diagnosis not present

## 2016-04-27 DIAGNOSIS — D631 Anemia in chronic kidney disease: Secondary | ICD-10-CM | POA: Diagnosis not present

## 2016-04-27 DIAGNOSIS — E1129 Type 2 diabetes mellitus with other diabetic kidney complication: Secondary | ICD-10-CM | POA: Diagnosis not present

## 2016-04-27 DIAGNOSIS — N186 End stage renal disease: Secondary | ICD-10-CM | POA: Diagnosis not present

## 2016-04-29 DIAGNOSIS — N186 End stage renal disease: Secondary | ICD-10-CM | POA: Diagnosis not present

## 2016-04-29 DIAGNOSIS — E1129 Type 2 diabetes mellitus with other diabetic kidney complication: Secondary | ICD-10-CM | POA: Diagnosis not present

## 2016-04-29 DIAGNOSIS — D631 Anemia in chronic kidney disease: Secondary | ICD-10-CM | POA: Diagnosis not present

## 2016-04-29 DIAGNOSIS — N2581 Secondary hyperparathyroidism of renal origin: Secondary | ICD-10-CM | POA: Diagnosis not present

## 2016-05-01 DIAGNOSIS — D631 Anemia in chronic kidney disease: Secondary | ICD-10-CM | POA: Diagnosis not present

## 2016-05-01 DIAGNOSIS — N2581 Secondary hyperparathyroidism of renal origin: Secondary | ICD-10-CM | POA: Diagnosis not present

## 2016-05-01 DIAGNOSIS — N186 End stage renal disease: Secondary | ICD-10-CM | POA: Diagnosis not present

## 2016-05-01 DIAGNOSIS — E1129 Type 2 diabetes mellitus with other diabetic kidney complication: Secondary | ICD-10-CM | POA: Diagnosis not present

## 2016-05-05 DIAGNOSIS — E1129 Type 2 diabetes mellitus with other diabetic kidney complication: Secondary | ICD-10-CM | POA: Diagnosis not present

## 2016-05-05 DIAGNOSIS — N2581 Secondary hyperparathyroidism of renal origin: Secondary | ICD-10-CM | POA: Diagnosis not present

## 2016-05-05 DIAGNOSIS — N186 End stage renal disease: Secondary | ICD-10-CM | POA: Diagnosis not present

## 2016-05-05 DIAGNOSIS — D631 Anemia in chronic kidney disease: Secondary | ICD-10-CM | POA: Diagnosis not present

## 2016-05-06 DIAGNOSIS — E1129 Type 2 diabetes mellitus with other diabetic kidney complication: Secondary | ICD-10-CM | POA: Diagnosis not present

## 2016-05-06 DIAGNOSIS — N2581 Secondary hyperparathyroidism of renal origin: Secondary | ICD-10-CM | POA: Diagnosis not present

## 2016-05-06 DIAGNOSIS — D631 Anemia in chronic kidney disease: Secondary | ICD-10-CM | POA: Diagnosis not present

## 2016-05-06 DIAGNOSIS — N186 End stage renal disease: Secondary | ICD-10-CM | POA: Diagnosis not present

## 2016-05-08 DIAGNOSIS — N186 End stage renal disease: Secondary | ICD-10-CM | POA: Diagnosis not present

## 2016-05-08 DIAGNOSIS — D631 Anemia in chronic kidney disease: Secondary | ICD-10-CM | POA: Diagnosis not present

## 2016-05-08 DIAGNOSIS — N2581 Secondary hyperparathyroidism of renal origin: Secondary | ICD-10-CM | POA: Diagnosis not present

## 2016-05-08 DIAGNOSIS — E1129 Type 2 diabetes mellitus with other diabetic kidney complication: Secondary | ICD-10-CM | POA: Diagnosis not present

## 2016-05-11 DIAGNOSIS — N2581 Secondary hyperparathyroidism of renal origin: Secondary | ICD-10-CM | POA: Diagnosis not present

## 2016-05-11 DIAGNOSIS — E1129 Type 2 diabetes mellitus with other diabetic kidney complication: Secondary | ICD-10-CM | POA: Diagnosis not present

## 2016-05-11 DIAGNOSIS — D631 Anemia in chronic kidney disease: Secondary | ICD-10-CM | POA: Diagnosis not present

## 2016-05-11 DIAGNOSIS — N186 End stage renal disease: Secondary | ICD-10-CM | POA: Diagnosis not present

## 2016-05-13 DIAGNOSIS — E1129 Type 2 diabetes mellitus with other diabetic kidney complication: Secondary | ICD-10-CM | POA: Diagnosis not present

## 2016-05-13 DIAGNOSIS — N186 End stage renal disease: Secondary | ICD-10-CM | POA: Diagnosis not present

## 2016-05-13 DIAGNOSIS — D631 Anemia in chronic kidney disease: Secondary | ICD-10-CM | POA: Diagnosis not present

## 2016-05-13 DIAGNOSIS — N2581 Secondary hyperparathyroidism of renal origin: Secondary | ICD-10-CM | POA: Diagnosis not present

## 2016-05-15 DIAGNOSIS — N186 End stage renal disease: Secondary | ICD-10-CM | POA: Diagnosis not present

## 2016-05-15 DIAGNOSIS — E1129 Type 2 diabetes mellitus with other diabetic kidney complication: Secondary | ICD-10-CM | POA: Diagnosis not present

## 2016-05-15 DIAGNOSIS — D631 Anemia in chronic kidney disease: Secondary | ICD-10-CM | POA: Diagnosis not present

## 2016-05-15 DIAGNOSIS — N2581 Secondary hyperparathyroidism of renal origin: Secondary | ICD-10-CM | POA: Diagnosis not present

## 2016-05-16 DIAGNOSIS — Z992 Dependence on renal dialysis: Secondary | ICD-10-CM | POA: Diagnosis not present

## 2016-05-16 DIAGNOSIS — N186 End stage renal disease: Secondary | ICD-10-CM | POA: Diagnosis not present

## 2016-05-16 DIAGNOSIS — E1129 Type 2 diabetes mellitus with other diabetic kidney complication: Secondary | ICD-10-CM | POA: Diagnosis not present

## 2016-05-18 DIAGNOSIS — N186 End stage renal disease: Secondary | ICD-10-CM | POA: Diagnosis not present

## 2016-05-18 DIAGNOSIS — D631 Anemia in chronic kidney disease: Secondary | ICD-10-CM | POA: Diagnosis not present

## 2016-05-18 DIAGNOSIS — E1129 Type 2 diabetes mellitus with other diabetic kidney complication: Secondary | ICD-10-CM | POA: Diagnosis not present

## 2016-05-18 DIAGNOSIS — N2581 Secondary hyperparathyroidism of renal origin: Secondary | ICD-10-CM | POA: Diagnosis not present

## 2016-05-20 DIAGNOSIS — N186 End stage renal disease: Secondary | ICD-10-CM | POA: Diagnosis not present

## 2016-05-20 DIAGNOSIS — D631 Anemia in chronic kidney disease: Secondary | ICD-10-CM | POA: Diagnosis not present

## 2016-05-20 DIAGNOSIS — E1129 Type 2 diabetes mellitus with other diabetic kidney complication: Secondary | ICD-10-CM | POA: Diagnosis not present

## 2016-05-20 DIAGNOSIS — N2581 Secondary hyperparathyroidism of renal origin: Secondary | ICD-10-CM | POA: Diagnosis not present

## 2016-05-21 IMAGING — CR DG CHEST 2V
2 series · 2 of 2 positions shown · non-contrast
Comparison: 05/26/2013

CLINICAL DATA: Cough for approximately 2 months

EXAM:
CHEST  2 VIEW

[view not recorded (1 of 2)]
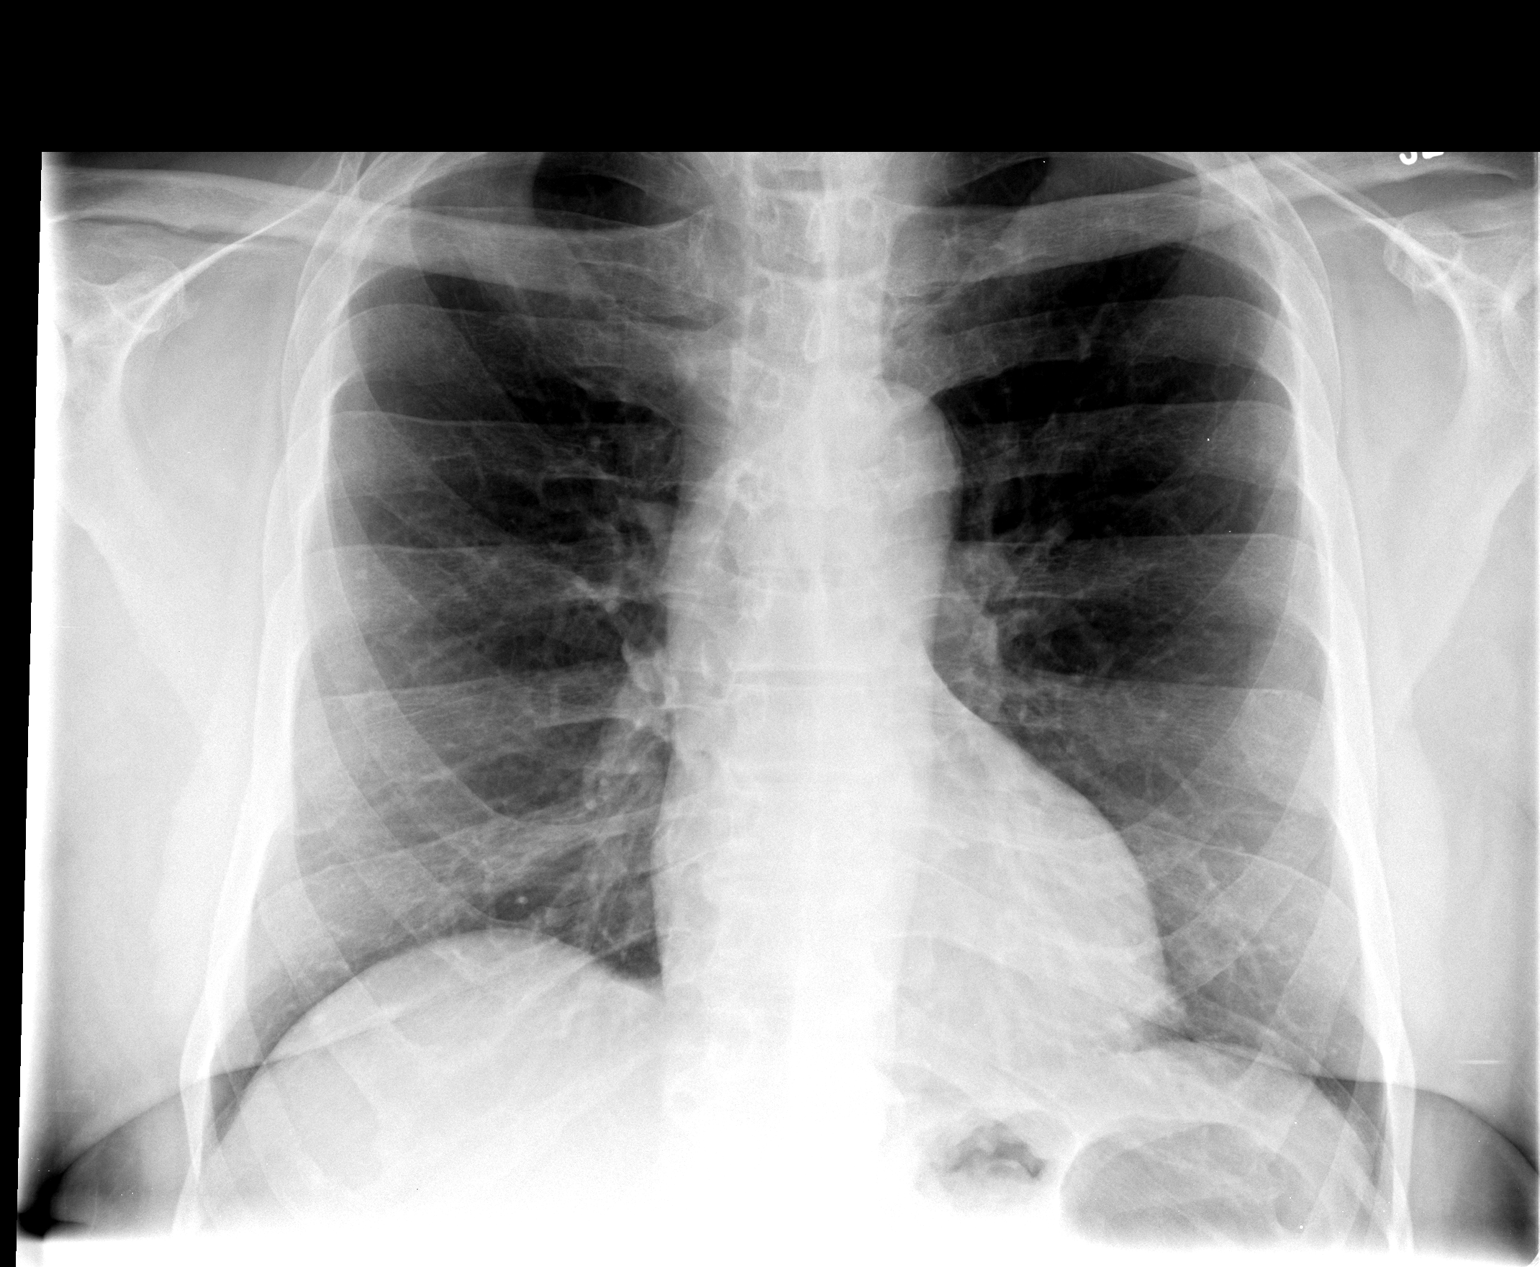

[view not recorded (2 of 2)]
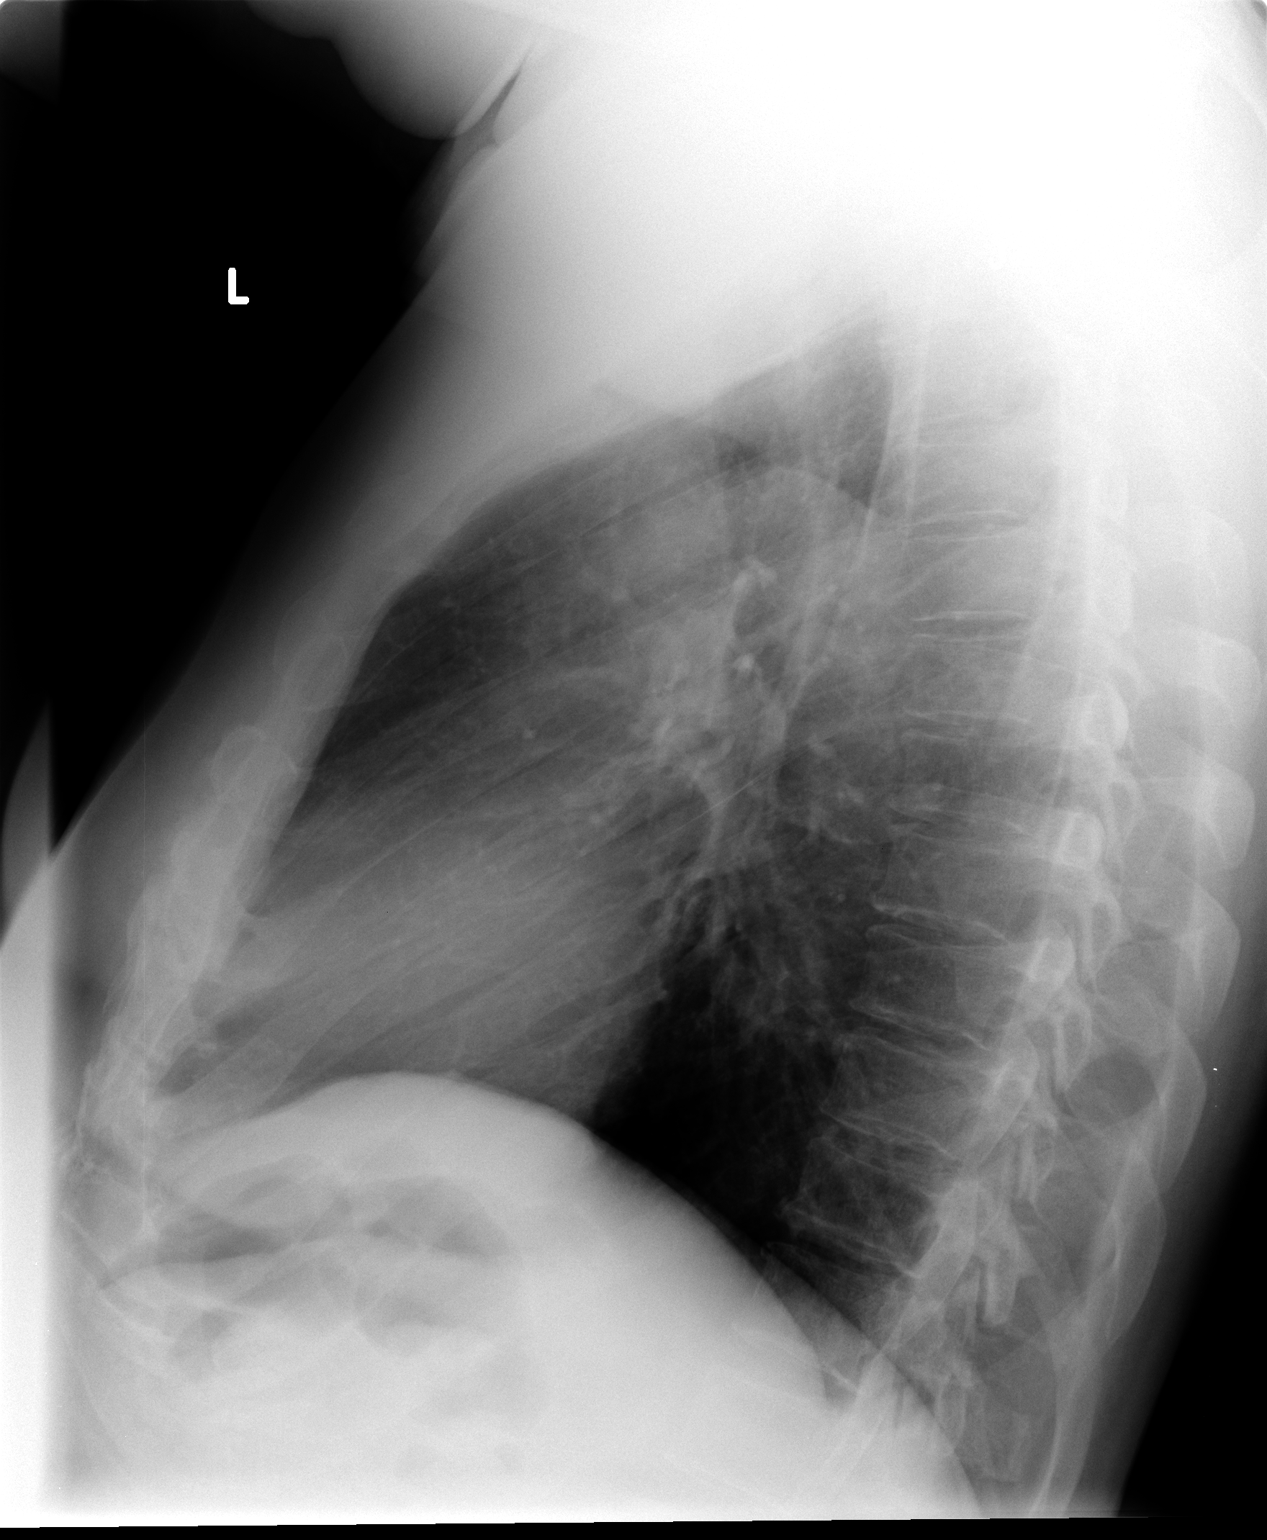

[2 of 2 positions shown; findings below may reference images not displayed]

FINDINGS: Cardiomediastinal silhouette is stable. No acute infiltrate or
pleural effusion. No pulmonary edema. Bony thorax is unremarkable.
IMPRESSION: No active cardiopulmonary disease.

## 2016-05-22 DIAGNOSIS — E1129 Type 2 diabetes mellitus with other diabetic kidney complication: Secondary | ICD-10-CM | POA: Diagnosis not present

## 2016-05-22 DIAGNOSIS — N186 End stage renal disease: Secondary | ICD-10-CM | POA: Diagnosis not present

## 2016-05-22 DIAGNOSIS — D631 Anemia in chronic kidney disease: Secondary | ICD-10-CM | POA: Diagnosis not present

## 2016-05-22 DIAGNOSIS — N2581 Secondary hyperparathyroidism of renal origin: Secondary | ICD-10-CM | POA: Diagnosis not present

## 2016-05-25 DIAGNOSIS — N186 End stage renal disease: Secondary | ICD-10-CM | POA: Diagnosis not present

## 2016-05-25 DIAGNOSIS — N2581 Secondary hyperparathyroidism of renal origin: Secondary | ICD-10-CM | POA: Diagnosis not present

## 2016-05-25 DIAGNOSIS — D631 Anemia in chronic kidney disease: Secondary | ICD-10-CM | POA: Diagnosis not present

## 2016-05-25 DIAGNOSIS — E1129 Type 2 diabetes mellitus with other diabetic kidney complication: Secondary | ICD-10-CM | POA: Diagnosis not present

## 2016-05-27 DIAGNOSIS — D631 Anemia in chronic kidney disease: Secondary | ICD-10-CM | POA: Diagnosis not present

## 2016-05-27 DIAGNOSIS — N2581 Secondary hyperparathyroidism of renal origin: Secondary | ICD-10-CM | POA: Diagnosis not present

## 2016-05-27 DIAGNOSIS — E1129 Type 2 diabetes mellitus with other diabetic kidney complication: Secondary | ICD-10-CM | POA: Diagnosis not present

## 2016-05-27 DIAGNOSIS — N186 End stage renal disease: Secondary | ICD-10-CM | POA: Diagnosis not present

## 2016-05-29 DIAGNOSIS — E1129 Type 2 diabetes mellitus with other diabetic kidney complication: Secondary | ICD-10-CM | POA: Diagnosis not present

## 2016-05-29 DIAGNOSIS — N2581 Secondary hyperparathyroidism of renal origin: Secondary | ICD-10-CM | POA: Diagnosis not present

## 2016-05-29 DIAGNOSIS — D631 Anemia in chronic kidney disease: Secondary | ICD-10-CM | POA: Diagnosis not present

## 2016-05-29 DIAGNOSIS — N186 End stage renal disease: Secondary | ICD-10-CM | POA: Diagnosis not present

## 2016-06-01 DIAGNOSIS — E1129 Type 2 diabetes mellitus with other diabetic kidney complication: Secondary | ICD-10-CM | POA: Diagnosis not present

## 2016-06-01 DIAGNOSIS — N186 End stage renal disease: Secondary | ICD-10-CM | POA: Diagnosis not present

## 2016-06-01 DIAGNOSIS — N2581 Secondary hyperparathyroidism of renal origin: Secondary | ICD-10-CM | POA: Diagnosis not present

## 2016-06-01 DIAGNOSIS — D631 Anemia in chronic kidney disease: Secondary | ICD-10-CM | POA: Diagnosis not present

## 2016-06-03 DIAGNOSIS — D631 Anemia in chronic kidney disease: Secondary | ICD-10-CM | POA: Diagnosis not present

## 2016-06-03 DIAGNOSIS — N2581 Secondary hyperparathyroidism of renal origin: Secondary | ICD-10-CM | POA: Diagnosis not present

## 2016-06-03 DIAGNOSIS — E1129 Type 2 diabetes mellitus with other diabetic kidney complication: Secondary | ICD-10-CM | POA: Diagnosis not present

## 2016-06-03 DIAGNOSIS — N186 End stage renal disease: Secondary | ICD-10-CM | POA: Diagnosis not present

## 2016-06-05 DIAGNOSIS — D631 Anemia in chronic kidney disease: Secondary | ICD-10-CM | POA: Diagnosis not present

## 2016-06-05 DIAGNOSIS — N2581 Secondary hyperparathyroidism of renal origin: Secondary | ICD-10-CM | POA: Diagnosis not present

## 2016-06-05 DIAGNOSIS — N186 End stage renal disease: Secondary | ICD-10-CM | POA: Diagnosis not present

## 2016-06-05 DIAGNOSIS — E1129 Type 2 diabetes mellitus with other diabetic kidney complication: Secondary | ICD-10-CM | POA: Diagnosis not present

## 2016-06-08 DIAGNOSIS — E1129 Type 2 diabetes mellitus with other diabetic kidney complication: Secondary | ICD-10-CM | POA: Diagnosis not present

## 2016-06-08 DIAGNOSIS — N2581 Secondary hyperparathyroidism of renal origin: Secondary | ICD-10-CM | POA: Diagnosis not present

## 2016-06-08 DIAGNOSIS — N186 End stage renal disease: Secondary | ICD-10-CM | POA: Diagnosis not present

## 2016-06-08 DIAGNOSIS — D631 Anemia in chronic kidney disease: Secondary | ICD-10-CM | POA: Diagnosis not present

## 2016-06-10 DIAGNOSIS — N2581 Secondary hyperparathyroidism of renal origin: Secondary | ICD-10-CM | POA: Diagnosis not present

## 2016-06-10 DIAGNOSIS — N186 End stage renal disease: Secondary | ICD-10-CM | POA: Diagnosis not present

## 2016-06-10 DIAGNOSIS — D631 Anemia in chronic kidney disease: Secondary | ICD-10-CM | POA: Diagnosis not present

## 2016-06-10 DIAGNOSIS — E1129 Type 2 diabetes mellitus with other diabetic kidney complication: Secondary | ICD-10-CM | POA: Diagnosis not present

## 2016-06-11 ENCOUNTER — Encounter: Payer: Self-pay | Admitting: Family Medicine

## 2016-06-12 DIAGNOSIS — N186 End stage renal disease: Secondary | ICD-10-CM | POA: Diagnosis not present

## 2016-06-12 DIAGNOSIS — E1129 Type 2 diabetes mellitus with other diabetic kidney complication: Secondary | ICD-10-CM | POA: Diagnosis not present

## 2016-06-12 DIAGNOSIS — N2581 Secondary hyperparathyroidism of renal origin: Secondary | ICD-10-CM | POA: Diagnosis not present

## 2016-06-12 DIAGNOSIS — D631 Anemia in chronic kidney disease: Secondary | ICD-10-CM | POA: Diagnosis not present

## 2016-06-15 DIAGNOSIS — Z992 Dependence on renal dialysis: Secondary | ICD-10-CM | POA: Diagnosis not present

## 2016-06-15 DIAGNOSIS — E1129 Type 2 diabetes mellitus with other diabetic kidney complication: Secondary | ICD-10-CM | POA: Diagnosis not present

## 2016-06-15 DIAGNOSIS — D631 Anemia in chronic kidney disease: Secondary | ICD-10-CM | POA: Diagnosis not present

## 2016-06-15 DIAGNOSIS — N2581 Secondary hyperparathyroidism of renal origin: Secondary | ICD-10-CM | POA: Diagnosis not present

## 2016-06-15 DIAGNOSIS — N186 End stage renal disease: Secondary | ICD-10-CM | POA: Diagnosis not present

## 2016-06-17 DIAGNOSIS — N186 End stage renal disease: Secondary | ICD-10-CM | POA: Diagnosis not present

## 2016-06-17 DIAGNOSIS — N2581 Secondary hyperparathyroidism of renal origin: Secondary | ICD-10-CM | POA: Diagnosis not present

## 2016-06-17 DIAGNOSIS — E1129 Type 2 diabetes mellitus with other diabetic kidney complication: Secondary | ICD-10-CM | POA: Diagnosis not present

## 2016-06-19 DIAGNOSIS — E1129 Type 2 diabetes mellitus with other diabetic kidney complication: Secondary | ICD-10-CM | POA: Diagnosis not present

## 2016-06-19 DIAGNOSIS — N2581 Secondary hyperparathyroidism of renal origin: Secondary | ICD-10-CM | POA: Diagnosis not present

## 2016-06-19 DIAGNOSIS — N186 End stage renal disease: Secondary | ICD-10-CM | POA: Diagnosis not present

## 2016-06-22 DIAGNOSIS — N186 End stage renal disease: Secondary | ICD-10-CM | POA: Diagnosis not present

## 2016-06-22 DIAGNOSIS — N2581 Secondary hyperparathyroidism of renal origin: Secondary | ICD-10-CM | POA: Diagnosis not present

## 2016-06-22 DIAGNOSIS — E1129 Type 2 diabetes mellitus with other diabetic kidney complication: Secondary | ICD-10-CM | POA: Diagnosis not present

## 2016-06-24 DIAGNOSIS — N186 End stage renal disease: Secondary | ICD-10-CM | POA: Diagnosis not present

## 2016-06-24 DIAGNOSIS — N2581 Secondary hyperparathyroidism of renal origin: Secondary | ICD-10-CM | POA: Diagnosis not present

## 2016-06-24 DIAGNOSIS — E1129 Type 2 diabetes mellitus with other diabetic kidney complication: Secondary | ICD-10-CM | POA: Diagnosis not present

## 2016-06-26 DIAGNOSIS — N2581 Secondary hyperparathyroidism of renal origin: Secondary | ICD-10-CM | POA: Diagnosis not present

## 2016-06-26 DIAGNOSIS — E1129 Type 2 diabetes mellitus with other diabetic kidney complication: Secondary | ICD-10-CM | POA: Diagnosis not present

## 2016-06-26 DIAGNOSIS — N186 End stage renal disease: Secondary | ICD-10-CM | POA: Diagnosis not present

## 2016-06-29 DIAGNOSIS — N2581 Secondary hyperparathyroidism of renal origin: Secondary | ICD-10-CM | POA: Diagnosis not present

## 2016-06-29 DIAGNOSIS — E1129 Type 2 diabetes mellitus with other diabetic kidney complication: Secondary | ICD-10-CM | POA: Diagnosis not present

## 2016-06-29 DIAGNOSIS — N186 End stage renal disease: Secondary | ICD-10-CM | POA: Diagnosis not present

## 2016-07-01 DIAGNOSIS — N2581 Secondary hyperparathyroidism of renal origin: Secondary | ICD-10-CM | POA: Diagnosis not present

## 2016-07-01 DIAGNOSIS — N186 End stage renal disease: Secondary | ICD-10-CM | POA: Diagnosis not present

## 2016-07-01 DIAGNOSIS — E1129 Type 2 diabetes mellitus with other diabetic kidney complication: Secondary | ICD-10-CM | POA: Diagnosis not present

## 2016-07-03 DIAGNOSIS — N2581 Secondary hyperparathyroidism of renal origin: Secondary | ICD-10-CM | POA: Diagnosis not present

## 2016-07-03 DIAGNOSIS — N186 End stage renal disease: Secondary | ICD-10-CM | POA: Diagnosis not present

## 2016-07-03 DIAGNOSIS — E1129 Type 2 diabetes mellitus with other diabetic kidney complication: Secondary | ICD-10-CM | POA: Diagnosis not present

## 2016-07-06 DIAGNOSIS — N2581 Secondary hyperparathyroidism of renal origin: Secondary | ICD-10-CM | POA: Diagnosis not present

## 2016-07-06 DIAGNOSIS — N186 End stage renal disease: Secondary | ICD-10-CM | POA: Diagnosis not present

## 2016-07-06 DIAGNOSIS — E1129 Type 2 diabetes mellitus with other diabetic kidney complication: Secondary | ICD-10-CM | POA: Diagnosis not present

## 2016-07-08 DIAGNOSIS — N2581 Secondary hyperparathyroidism of renal origin: Secondary | ICD-10-CM | POA: Diagnosis not present

## 2016-07-08 DIAGNOSIS — N186 End stage renal disease: Secondary | ICD-10-CM | POA: Diagnosis not present

## 2016-07-08 DIAGNOSIS — E1129 Type 2 diabetes mellitus with other diabetic kidney complication: Secondary | ICD-10-CM | POA: Diagnosis not present

## 2016-07-10 DIAGNOSIS — N2581 Secondary hyperparathyroidism of renal origin: Secondary | ICD-10-CM | POA: Diagnosis not present

## 2016-07-10 DIAGNOSIS — E1129 Type 2 diabetes mellitus with other diabetic kidney complication: Secondary | ICD-10-CM | POA: Diagnosis not present

## 2016-07-10 DIAGNOSIS — N186 End stage renal disease: Secondary | ICD-10-CM | POA: Diagnosis not present

## 2016-07-13 DIAGNOSIS — N2581 Secondary hyperparathyroidism of renal origin: Secondary | ICD-10-CM | POA: Diagnosis not present

## 2016-07-13 DIAGNOSIS — E1129 Type 2 diabetes mellitus with other diabetic kidney complication: Secondary | ICD-10-CM | POA: Diagnosis not present

## 2016-07-13 DIAGNOSIS — N186 End stage renal disease: Secondary | ICD-10-CM | POA: Diagnosis not present

## 2016-07-15 DIAGNOSIS — N2581 Secondary hyperparathyroidism of renal origin: Secondary | ICD-10-CM | POA: Diagnosis not present

## 2016-07-15 DIAGNOSIS — N186 End stage renal disease: Secondary | ICD-10-CM | POA: Diagnosis not present

## 2016-07-15 DIAGNOSIS — E1129 Type 2 diabetes mellitus with other diabetic kidney complication: Secondary | ICD-10-CM | POA: Diagnosis not present

## 2016-07-16 DIAGNOSIS — E1129 Type 2 diabetes mellitus with other diabetic kidney complication: Secondary | ICD-10-CM | POA: Diagnosis not present

## 2016-07-16 DIAGNOSIS — Z992 Dependence on renal dialysis: Secondary | ICD-10-CM | POA: Diagnosis not present

## 2016-07-16 DIAGNOSIS — N186 End stage renal disease: Secondary | ICD-10-CM | POA: Diagnosis not present

## 2016-07-17 DIAGNOSIS — N186 End stage renal disease: Secondary | ICD-10-CM | POA: Diagnosis not present

## 2016-07-17 DIAGNOSIS — D631 Anemia in chronic kidney disease: Secondary | ICD-10-CM | POA: Diagnosis not present

## 2016-07-17 DIAGNOSIS — N2581 Secondary hyperparathyroidism of renal origin: Secondary | ICD-10-CM | POA: Diagnosis not present

## 2016-07-17 DIAGNOSIS — E1129 Type 2 diabetes mellitus with other diabetic kidney complication: Secondary | ICD-10-CM | POA: Diagnosis not present

## 2016-07-20 ENCOUNTER — Ambulatory Visit: Payer: Medicare Other | Admitting: Family Medicine

## 2016-07-20 DIAGNOSIS — D631 Anemia in chronic kidney disease: Secondary | ICD-10-CM | POA: Diagnosis not present

## 2016-07-20 DIAGNOSIS — N2581 Secondary hyperparathyroidism of renal origin: Secondary | ICD-10-CM | POA: Diagnosis not present

## 2016-07-20 DIAGNOSIS — E1129 Type 2 diabetes mellitus with other diabetic kidney complication: Secondary | ICD-10-CM | POA: Diagnosis not present

## 2016-07-20 DIAGNOSIS — N186 End stage renal disease: Secondary | ICD-10-CM | POA: Diagnosis not present

## 2016-07-22 DIAGNOSIS — N2581 Secondary hyperparathyroidism of renal origin: Secondary | ICD-10-CM | POA: Diagnosis not present

## 2016-07-22 DIAGNOSIS — N186 End stage renal disease: Secondary | ICD-10-CM | POA: Diagnosis not present

## 2016-07-22 DIAGNOSIS — E1129 Type 2 diabetes mellitus with other diabetic kidney complication: Secondary | ICD-10-CM | POA: Diagnosis not present

## 2016-07-22 DIAGNOSIS — D631 Anemia in chronic kidney disease: Secondary | ICD-10-CM | POA: Diagnosis not present

## 2016-07-24 DIAGNOSIS — D631 Anemia in chronic kidney disease: Secondary | ICD-10-CM | POA: Diagnosis not present

## 2016-07-24 DIAGNOSIS — N2581 Secondary hyperparathyroidism of renal origin: Secondary | ICD-10-CM | POA: Diagnosis not present

## 2016-07-24 DIAGNOSIS — N186 End stage renal disease: Secondary | ICD-10-CM | POA: Diagnosis not present

## 2016-07-24 DIAGNOSIS — E1129 Type 2 diabetes mellitus with other diabetic kidney complication: Secondary | ICD-10-CM | POA: Diagnosis not present

## 2016-07-27 DIAGNOSIS — E1129 Type 2 diabetes mellitus with other diabetic kidney complication: Secondary | ICD-10-CM | POA: Diagnosis not present

## 2016-07-27 DIAGNOSIS — N2581 Secondary hyperparathyroidism of renal origin: Secondary | ICD-10-CM | POA: Diagnosis not present

## 2016-07-27 DIAGNOSIS — D631 Anemia in chronic kidney disease: Secondary | ICD-10-CM | POA: Diagnosis not present

## 2016-07-27 DIAGNOSIS — N186 End stage renal disease: Secondary | ICD-10-CM | POA: Diagnosis not present

## 2016-07-29 ENCOUNTER — Ambulatory Visit (INDEPENDENT_AMBULATORY_CARE_PROVIDER_SITE_OTHER): Payer: Medicare Other | Admitting: Family Medicine

## 2016-07-29 ENCOUNTER — Encounter: Payer: Self-pay | Admitting: Family Medicine

## 2016-07-29 VITALS — BP 132/78 | HR 66 | Temp 98.4°F | Resp 14 | Ht 76.0 in | Wt 239.0 lb

## 2016-07-29 DIAGNOSIS — Z992 Dependence on renal dialysis: Secondary | ICD-10-CM | POA: Diagnosis not present

## 2016-07-29 DIAGNOSIS — H9191 Unspecified hearing loss, right ear: Secondary | ICD-10-CM | POA: Diagnosis not present

## 2016-07-29 DIAGNOSIS — Z1211 Encounter for screening for malignant neoplasm of colon: Secondary | ICD-10-CM | POA: Diagnosis not present

## 2016-07-29 DIAGNOSIS — I10 Essential (primary) hypertension: Secondary | ICD-10-CM | POA: Diagnosis not present

## 2016-07-29 DIAGNOSIS — E118 Type 2 diabetes mellitus with unspecified complications: Secondary | ICD-10-CM

## 2016-07-29 DIAGNOSIS — J301 Allergic rhinitis due to pollen: Secondary | ICD-10-CM | POA: Diagnosis not present

## 2016-07-29 DIAGNOSIS — H9311 Tinnitus, right ear: Secondary | ICD-10-CM

## 2016-07-29 DIAGNOSIS — N185 Chronic kidney disease, stage 5: Secondary | ICD-10-CM

## 2016-07-29 DIAGNOSIS — E7800 Pure hypercholesterolemia, unspecified: Secondary | ICD-10-CM | POA: Diagnosis not present

## 2016-07-29 DIAGNOSIS — D631 Anemia in chronic kidney disease: Secondary | ICD-10-CM

## 2016-07-29 DIAGNOSIS — N186 End stage renal disease: Secondary | ICD-10-CM | POA: Diagnosis not present

## 2016-07-29 DIAGNOSIS — E1129 Type 2 diabetes mellitus with other diabetic kidney complication: Secondary | ICD-10-CM | POA: Diagnosis not present

## 2016-07-29 DIAGNOSIS — N2581 Secondary hyperparathyroidism of renal origin: Secondary | ICD-10-CM | POA: Diagnosis not present

## 2016-07-29 MED ORDER — OLOPATADINE HCL 0.2 % OP SOLN: OPHTHALMIC | 1 refills | Status: DC

## 2016-07-29 NOTE — Patient Instructions (Addendum)
F/U 6 months for PHYSICAL  Colonoscopy to be done Opthalmology referral  Referral to ENT for your hearing

## 2016-07-29 NOTE — Assessment & Plan Note (Signed)
Recent A1c shows good control with diet.

## 2016-07-29 NOTE — Assessment & Plan Note (Signed)
Check CBC on iron

## 2016-07-29 NOTE — Assessment & Plan Note (Signed)
Blood pressure is well controlled with no medications. Also check his cholesterol for the year.  Regards to his concern about exposures I will need to research this before I look at the paperwork. Advised that there is no way for me to tell that his medical problems are due in entirety to possible exposure as this was greater than 30 years ago. He states he understands this.
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
## 2016-07-30 LAB — LIPID PANEL
CHOL/HDL RATIO: 4.4 ratio (ref <5.0)
Cholesterol: 235 mg/dL — ABNORMAL HIGH (ref <200)
HDL: 54 mg/dL (ref >40)
LDL Cholesterol: 151 mg/dL — ABNORMAL HIGH (ref <100)
Triglycerides: 150 mg/dL — ABNORMAL HIGH (ref <150)
VLDL: 30 mg/dL (ref <30)

## 2016-07-30 LAB — CBC WITH DIFFERENTIAL/PLATELET
BASOS ABS: 47 {cells}/uL (ref 0–200)
Basophils Relative: 1 %
EOS ABS: 376 {cells}/uL (ref 15–500)
Eosinophils Relative: 8 %
HEMATOCRIT: 39.6 % (ref 38.5–50.0)
Hemoglobin: 12.9 g/dL — ABNORMAL LOW (ref 13.0–17.0)
Lymphocytes Relative: 21 %
Lymphs Abs: 987 cells/uL (ref 850–3900)
MCH: 28.7 pg (ref 27.0–33.0)
MCHC: 32.6 g/dL (ref 32.0–36.0)
MCV: 88 fL (ref 80.0–100.0)
MPV: 9.2 fL (ref 7.5–12.5)
Monocytes Absolute: 376 cells/uL (ref 200–950)
Monocytes Relative: 8 %
NEUTROS ABS: 2914 {cells}/uL (ref 1500–7800)
Neutrophils Relative %: 62 %
PLATELETS: 253 10*3/uL (ref 140–400)
RBC: 4.5 MIL/uL (ref 4.20–5.80)
RDW: 14.9 % (ref 11.0–15.0)
WBC: 4.7 10*3/uL (ref 3.8–10.8)

## 2016-07-30 LAB — COMPREHENSIVE METABOLIC PANEL
ALT: 15 U/L (ref 9–46)
AST: 15 U/L (ref 10–35)
Albumin: 4.7 g/dL (ref 3.6–5.1)
Alkaline Phosphatase: 109 U/L (ref 40–115)
BUN: 24 mg/dL (ref 7–25)
CALCIUM: 9 mg/dL (ref 8.6–10.3)
CO2: 30 mmol/L (ref 20–31)
Chloride: 94 mmol/L — ABNORMAL LOW (ref 98–110)
Creat: 6.24 mg/dL — ABNORMAL HIGH (ref 0.70–1.25)
Glucose, Bld: 127 mg/dL — ABNORMAL HIGH (ref 70–99)
POTASSIUM: 3.6 mmol/L (ref 3.5–5.3)
Sodium: 138 mmol/L (ref 135–146)
Total Bilirubin: 0.7 mg/dL (ref 0.2–1.2)
Total Protein: 8.2 g/dL — ABNORMAL HIGH (ref 6.1–8.1)

## 2016-07-31 DIAGNOSIS — N2581 Secondary hyperparathyroidism of renal origin: Secondary | ICD-10-CM | POA: Diagnosis not present

## 2016-07-31 DIAGNOSIS — E1129 Type 2 diabetes mellitus with other diabetic kidney complication: Secondary | ICD-10-CM | POA: Diagnosis not present

## 2016-07-31 DIAGNOSIS — N186 End stage renal disease: Secondary | ICD-10-CM | POA: Diagnosis not present

## 2016-07-31 DIAGNOSIS — D631 Anemia in chronic kidney disease: Secondary | ICD-10-CM | POA: Diagnosis not present

## 2016-08-03 DIAGNOSIS — N2581 Secondary hyperparathyroidism of renal origin: Secondary | ICD-10-CM | POA: Diagnosis not present

## 2016-08-03 DIAGNOSIS — E1129 Type 2 diabetes mellitus with other diabetic kidney complication: Secondary | ICD-10-CM | POA: Diagnosis not present

## 2016-08-03 DIAGNOSIS — D631 Anemia in chronic kidney disease: Secondary | ICD-10-CM | POA: Diagnosis not present

## 2016-08-03 DIAGNOSIS — N186 End stage renal disease: Secondary | ICD-10-CM | POA: Diagnosis not present

## 2016-08-05 DIAGNOSIS — D631 Anemia in chronic kidney disease: Secondary | ICD-10-CM | POA: Diagnosis not present

## 2016-08-05 DIAGNOSIS — N2581 Secondary hyperparathyroidism of renal origin: Secondary | ICD-10-CM | POA: Diagnosis not present

## 2016-08-05 DIAGNOSIS — E1129 Type 2 diabetes mellitus with other diabetic kidney complication: Secondary | ICD-10-CM | POA: Diagnosis not present

## 2016-08-05 DIAGNOSIS — N186 End stage renal disease: Secondary | ICD-10-CM | POA: Diagnosis not present

## 2016-08-07 DIAGNOSIS — E1129 Type 2 diabetes mellitus with other diabetic kidney complication: Secondary | ICD-10-CM | POA: Diagnosis not present

## 2016-08-07 DIAGNOSIS — D631 Anemia in chronic kidney disease: Secondary | ICD-10-CM | POA: Diagnosis not present

## 2016-08-07 DIAGNOSIS — N2581 Secondary hyperparathyroidism of renal origin: Secondary | ICD-10-CM | POA: Diagnosis not present

## 2016-08-07 DIAGNOSIS — N186 End stage renal disease: Secondary | ICD-10-CM | POA: Diagnosis not present

## 2016-08-10 DIAGNOSIS — E1129 Type 2 diabetes mellitus with other diabetic kidney complication: Secondary | ICD-10-CM | POA: Diagnosis not present

## 2016-08-10 DIAGNOSIS — D631 Anemia in chronic kidney disease: Secondary | ICD-10-CM | POA: Diagnosis not present

## 2016-08-10 DIAGNOSIS — N2581 Secondary hyperparathyroidism of renal origin: Secondary | ICD-10-CM | POA: Diagnosis not present

## 2016-08-10 DIAGNOSIS — N186 End stage renal disease: Secondary | ICD-10-CM | POA: Diagnosis not present

## 2016-08-12 DIAGNOSIS — N2581 Secondary hyperparathyroidism of renal origin: Secondary | ICD-10-CM | POA: Diagnosis not present

## 2016-08-12 DIAGNOSIS — D631 Anemia in chronic kidney disease: Secondary | ICD-10-CM | POA: Diagnosis not present

## 2016-08-12 DIAGNOSIS — E1129 Type 2 diabetes mellitus with other diabetic kidney complication: Secondary | ICD-10-CM | POA: Diagnosis not present

## 2016-08-12 DIAGNOSIS — N186 End stage renal disease: Secondary | ICD-10-CM | POA: Diagnosis not present

## 2016-08-13 ENCOUNTER — Other Ambulatory Visit: Payer: Self-pay | Admitting: *Deleted

## 2016-08-13 ENCOUNTER — Encounter: Payer: Self-pay | Admitting: *Deleted

## 2016-08-13 MED ORDER — TAMSULOSIN HCL 0.4 MG PO CAPS: 0.4000 mg | ORAL_CAPSULE | Freq: Two times a day (BID) | ORAL | 0 refills | Status: AC

## 2016-08-13 MED ORDER — ATORVASTATIN CALCIUM 20 MG PO TABS: 20.0000 mg | ORAL_TABLET | Freq: Every day | ORAL | 3 refills | Status: DC

## 2016-08-13 MED ORDER — COLCHICINE 0.6 MG PO TABS: 0.6000 mg | ORAL_TABLET | Freq: Every day | ORAL | 3 refills | Status: DC

## 2016-08-13 MED ORDER — ATORVASTATIN CALCIUM 20 MG PO TABS: 20.0000 mg | ORAL_TABLET | Freq: Every day | ORAL | 3 refills | Status: AC

## 2016-08-13 MED ORDER — VITAMIN D (ERGOCALCIFEROL) 1.25 MG (50000 UNIT) PO CAPS: ORAL_CAPSULE | ORAL | 0 refills | Status: DC

## 2016-08-14 DIAGNOSIS — N186 End stage renal disease: Secondary | ICD-10-CM | POA: Diagnosis not present

## 2016-08-14 DIAGNOSIS — E1129 Type 2 diabetes mellitus with other diabetic kidney complication: Secondary | ICD-10-CM | POA: Diagnosis not present

## 2016-08-14 DIAGNOSIS — D631 Anemia in chronic kidney disease: Secondary | ICD-10-CM | POA: Diagnosis not present

## 2016-08-14 DIAGNOSIS — N2581 Secondary hyperparathyroidism of renal origin: Secondary | ICD-10-CM | POA: Diagnosis not present

## 2016-08-15 DIAGNOSIS — Z992 Dependence on renal dialysis: Secondary | ICD-10-CM | POA: Diagnosis not present

## 2016-08-15 DIAGNOSIS — N186 End stage renal disease: Secondary | ICD-10-CM | POA: Diagnosis not present

## 2016-08-15 DIAGNOSIS — E1129 Type 2 diabetes mellitus with other diabetic kidney complication: Secondary | ICD-10-CM | POA: Diagnosis not present

## 2016-08-17 DIAGNOSIS — H2513 Age-related nuclear cataract, bilateral: Secondary | ICD-10-CM | POA: Diagnosis not present

## 2016-08-17 DIAGNOSIS — N186 End stage renal disease: Secondary | ICD-10-CM | POA: Diagnosis not present

## 2016-08-17 DIAGNOSIS — N2581 Secondary hyperparathyroidism of renal origin: Secondary | ICD-10-CM | POA: Diagnosis not present

## 2016-08-17 DIAGNOSIS — H25013 Cortical age-related cataract, bilateral: Secondary | ICD-10-CM | POA: Diagnosis not present

## 2016-08-17 DIAGNOSIS — E113593 Type 2 diabetes mellitus with proliferative diabetic retinopathy without macular edema, bilateral: Secondary | ICD-10-CM | POA: Diagnosis not present

## 2016-08-17 DIAGNOSIS — H35033 Hypertensive retinopathy, bilateral: Secondary | ICD-10-CM | POA: Diagnosis not present

## 2016-08-17 DIAGNOSIS — D631 Anemia in chronic kidney disease: Secondary | ICD-10-CM | POA: Diagnosis not present

## 2016-08-17 DIAGNOSIS — H25042 Posterior subcapsular polar age-related cataract, left eye: Secondary | ICD-10-CM | POA: Diagnosis not present

## 2016-08-17 DIAGNOSIS — H35373 Puckering of macula, bilateral: Secondary | ICD-10-CM | POA: Diagnosis not present

## 2016-08-17 LAB — HM DIABETES EYE EXAM

## 2016-08-19 DIAGNOSIS — N186 End stage renal disease: Secondary | ICD-10-CM | POA: Diagnosis not present

## 2016-08-19 DIAGNOSIS — N2581 Secondary hyperparathyroidism of renal origin: Secondary | ICD-10-CM | POA: Diagnosis not present

## 2016-08-19 DIAGNOSIS — D631 Anemia in chronic kidney disease: Secondary | ICD-10-CM | POA: Diagnosis not present

## 2016-08-21 DIAGNOSIS — D631 Anemia in chronic kidney disease: Secondary | ICD-10-CM | POA: Diagnosis not present

## 2016-08-21 DIAGNOSIS — N186 End stage renal disease: Secondary | ICD-10-CM | POA: Diagnosis not present

## 2016-08-21 DIAGNOSIS — N2581 Secondary hyperparathyroidism of renal origin: Secondary | ICD-10-CM | POA: Diagnosis not present

## 2016-08-24 DIAGNOSIS — N2581 Secondary hyperparathyroidism of renal origin: Secondary | ICD-10-CM | POA: Diagnosis not present

## 2016-08-24 DIAGNOSIS — D631 Anemia in chronic kidney disease: Secondary | ICD-10-CM | POA: Diagnosis not present

## 2016-08-24 DIAGNOSIS — N186 End stage renal disease: Secondary | ICD-10-CM | POA: Diagnosis not present

## 2016-08-25 ENCOUNTER — Encounter: Payer: Self-pay | Admitting: Family Medicine

## 2016-08-26 DIAGNOSIS — D631 Anemia in chronic kidney disease: Secondary | ICD-10-CM | POA: Diagnosis not present

## 2016-08-26 DIAGNOSIS — N186 End stage renal disease: Secondary | ICD-10-CM | POA: Diagnosis not present

## 2016-08-26 DIAGNOSIS — N2581 Secondary hyperparathyroidism of renal origin: Secondary | ICD-10-CM | POA: Diagnosis not present

## 2016-08-28 ENCOUNTER — Encounter: Payer: Self-pay | Admitting: *Deleted

## 2016-08-28 DIAGNOSIS — D631 Anemia in chronic kidney disease: Secondary | ICD-10-CM | POA: Diagnosis not present

## 2016-08-28 DIAGNOSIS — N186 End stage renal disease: Secondary | ICD-10-CM | POA: Diagnosis not present

## 2016-08-28 DIAGNOSIS — N2581 Secondary hyperparathyroidism of renal origin: Secondary | ICD-10-CM | POA: Diagnosis not present

## 2016-08-31 DIAGNOSIS — D631 Anemia in chronic kidney disease: Secondary | ICD-10-CM | POA: Diagnosis not present

## 2016-08-31 DIAGNOSIS — N186 End stage renal disease: Secondary | ICD-10-CM | POA: Diagnosis not present

## 2016-08-31 DIAGNOSIS — N2581 Secondary hyperparathyroidism of renal origin: Secondary | ICD-10-CM | POA: Diagnosis not present

## 2016-09-03 DIAGNOSIS — D631 Anemia in chronic kidney disease: Secondary | ICD-10-CM | POA: Diagnosis not present

## 2016-09-03 DIAGNOSIS — N2581 Secondary hyperparathyroidism of renal origin: Secondary | ICD-10-CM | POA: Diagnosis not present

## 2016-09-03 DIAGNOSIS — N186 End stage renal disease: Secondary | ICD-10-CM | POA: Diagnosis not present

## 2016-09-03 DIAGNOSIS — E1129 Type 2 diabetes mellitus with other diabetic kidney complication: Secondary | ICD-10-CM | POA: Diagnosis not present

## 2016-09-04 DIAGNOSIS — N186 End stage renal disease: Secondary | ICD-10-CM | POA: Diagnosis not present

## 2016-09-04 DIAGNOSIS — D631 Anemia in chronic kidney disease: Secondary | ICD-10-CM | POA: Diagnosis not present

## 2016-09-04 DIAGNOSIS — N2581 Secondary hyperparathyroidism of renal origin: Secondary | ICD-10-CM | POA: Diagnosis not present

## 2016-09-07 DIAGNOSIS — N2581 Secondary hyperparathyroidism of renal origin: Secondary | ICD-10-CM | POA: Diagnosis not present

## 2016-09-07 DIAGNOSIS — D631 Anemia in chronic kidney disease: Secondary | ICD-10-CM | POA: Diagnosis not present

## 2016-09-07 DIAGNOSIS — N186 End stage renal disease: Secondary | ICD-10-CM | POA: Diagnosis not present

## 2016-09-09 DIAGNOSIS — D631 Anemia in chronic kidney disease: Secondary | ICD-10-CM | POA: Diagnosis not present

## 2016-09-09 DIAGNOSIS — N2581 Secondary hyperparathyroidism of renal origin: Secondary | ICD-10-CM | POA: Diagnosis not present

## 2016-09-09 DIAGNOSIS — N186 End stage renal disease: Secondary | ICD-10-CM | POA: Diagnosis not present

## 2016-09-10 ENCOUNTER — Encounter (INDEPENDENT_AMBULATORY_CARE_PROVIDER_SITE_OTHER): Payer: Medicare Other | Admitting: Ophthalmology

## 2016-09-11 DIAGNOSIS — N2581 Secondary hyperparathyroidism of renal origin: Secondary | ICD-10-CM | POA: Diagnosis not present

## 2016-09-11 DIAGNOSIS — D631 Anemia in chronic kidney disease: Secondary | ICD-10-CM | POA: Diagnosis not present

## 2016-09-11 DIAGNOSIS — N186 End stage renal disease: Secondary | ICD-10-CM | POA: Diagnosis not present

## 2016-09-14 DIAGNOSIS — N186 End stage renal disease: Secondary | ICD-10-CM | POA: Diagnosis not present

## 2016-09-14 DIAGNOSIS — D631 Anemia in chronic kidney disease: Secondary | ICD-10-CM | POA: Diagnosis not present

## 2016-09-14 DIAGNOSIS — N2581 Secondary hyperparathyroidism of renal origin: Secondary | ICD-10-CM | POA: Diagnosis not present

## 2016-09-15 DIAGNOSIS — E1129 Type 2 diabetes mellitus with other diabetic kidney complication: Secondary | ICD-10-CM | POA: Diagnosis not present

## 2016-09-15 DIAGNOSIS — Z992 Dependence on renal dialysis: Secondary | ICD-10-CM | POA: Diagnosis not present

## 2016-09-15 DIAGNOSIS — N186 End stage renal disease: Secondary | ICD-10-CM | POA: Diagnosis not present

## 2016-10-05 ENCOUNTER — Encounter (INDEPENDENT_AMBULATORY_CARE_PROVIDER_SITE_OTHER): Payer: Medicare Other | Admitting: Ophthalmology

## 2016-10-14 ENCOUNTER — Encounter: Payer: Self-pay | Admitting: Family Medicine

## 2016-10-16 DIAGNOSIS — Z992 Dependence on renal dialysis: Secondary | ICD-10-CM | POA: Diagnosis not present

## 2016-10-16 DIAGNOSIS — E1129 Type 2 diabetes mellitus with other diabetic kidney complication: Secondary | ICD-10-CM | POA: Diagnosis not present

## 2016-10-16 DIAGNOSIS — N186 End stage renal disease: Secondary | ICD-10-CM | POA: Diagnosis not present

## 2016-11-15 DIAGNOSIS — E1129 Type 2 diabetes mellitus with other diabetic kidney complication: Secondary | ICD-10-CM | POA: Diagnosis not present

## 2016-11-15 DIAGNOSIS — N186 End stage renal disease: Secondary | ICD-10-CM | POA: Diagnosis not present

## 2016-11-15 DIAGNOSIS — Z992 Dependence on renal dialysis: Secondary | ICD-10-CM | POA: Diagnosis not present

## 2016-12-16 DIAGNOSIS — Z992 Dependence on renal dialysis: Secondary | ICD-10-CM | POA: Diagnosis not present

## 2016-12-16 DIAGNOSIS — E1129 Type 2 diabetes mellitus with other diabetic kidney complication: Secondary | ICD-10-CM | POA: Diagnosis not present

## 2016-12-16 DIAGNOSIS — N186 End stage renal disease: Secondary | ICD-10-CM | POA: Diagnosis not present

## 2016-12-22 ENCOUNTER — Telehealth: Payer: Self-pay | Admitting: Family Medicine

## 2016-12-22 NOTE — Telephone Encounter (Signed)
Patient states that in 05/2013 Dr. Buelah Manis diagnosed him with stage 4 kidney failure. He needs documentation of the original diagnosis. He states that he has some kind of board meeting on Thursday and would like this by EOB tomorrow.  CB# [phone]

## 2016-12-22 NOTE — Telephone Encounter (Signed)
He has been dismissed from the clinic since August . He can request his records. From that time.  Also letters take 5-7 business days.

## 2016-12-24 NOTE — Telephone Encounter (Signed)
Patient came by and signed a release form for office visit. Since it was only one office visit I printed and gave to patient.

## 2016-12-28 ENCOUNTER — Emergency Department (HOSPITAL_COMMUNITY): Payer: No Typology Code available for payment source

## 2016-12-28 ENCOUNTER — Emergency Department (HOSPITAL_COMMUNITY)
Admission: EM | Admit: 2016-12-28 | Discharge: 2016-12-28 | Disposition: A | Discharge status: Home / Self Care | Payer: No Typology Code available for payment source | Attending: Emergency Medicine | Admitting: Emergency Medicine

## 2016-12-28 ENCOUNTER — Encounter (HOSPITAL_COMMUNITY): Payer: Self-pay | Admitting: Emergency Medicine

## 2016-12-28 ENCOUNTER — Other Ambulatory Visit: Payer: Self-pay

## 2016-12-28 DIAGNOSIS — M549 Dorsalgia, unspecified: Secondary | ICD-10-CM | POA: Diagnosis not present

## 2016-12-28 DIAGNOSIS — E119 Type 2 diabetes mellitus without complications: Secondary | ICD-10-CM | POA: Diagnosis not present

## 2016-12-28 DIAGNOSIS — Z87891 Personal history of nicotine dependence: Secondary | ICD-10-CM | POA: Insufficient documentation

## 2016-12-28 DIAGNOSIS — I10 Essential (primary) hypertension: Secondary | ICD-10-CM | POA: Diagnosis not present

## 2016-12-28 DIAGNOSIS — S139XXA Sprain of joints and ligaments of unspecified parts of neck, initial encounter: Secondary | ICD-10-CM | POA: Diagnosis not present

## 2016-12-28 DIAGNOSIS — Y9389 Activity, other specified: Secondary | ICD-10-CM | POA: Diagnosis not present

## 2016-12-28 DIAGNOSIS — Y92410 Unspecified street and highway as the place of occurrence of the external cause: Secondary | ICD-10-CM | POA: Insufficient documentation

## 2016-12-28 DIAGNOSIS — Z79899 Other long term (current) drug therapy: Secondary | ICD-10-CM | POA: Diagnosis not present

## 2016-12-28 DIAGNOSIS — Y999 Unspecified external cause status: Secondary | ICD-10-CM | POA: Insufficient documentation

## 2016-12-28 MED ORDER — ACETAMINOPHEN 325 MG PO TABS
650.0000 mg | ORAL_TABLET | Freq: Once | ORAL | Status: AC
  Administered 2016-12-28: 650 mg via ORAL
  Filled 2016-12-28: qty 2
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
## 2016-12-28 NOTE — Discharge Instructions (Signed)
Please read attached information. If you experience any new or worsening signs or symptoms please return to the emergency room for evaluation. Please follow-up with your primary care provider or specialist as discussed.  °

## 2017-01-09 DIAGNOSIS — N186 End stage renal disease: Secondary | ICD-10-CM | POA: Diagnosis not present

## 2017-01-15 DIAGNOSIS — E1129 Type 2 diabetes mellitus with other diabetic kidney complication: Secondary | ICD-10-CM | POA: Diagnosis not present

## 2017-01-15 DIAGNOSIS — N186 End stage renal disease: Secondary | ICD-10-CM | POA: Diagnosis not present

## 2017-01-15 DIAGNOSIS — Z992 Dependence on renal dialysis: Secondary | ICD-10-CM | POA: Diagnosis not present

## 2017-02-15 DIAGNOSIS — E1129 Type 2 diabetes mellitus with other diabetic kidney complication: Secondary | ICD-10-CM | POA: Diagnosis not present

## 2017-02-15 DIAGNOSIS — Z992 Dependence on renal dialysis: Secondary | ICD-10-CM | POA: Diagnosis not present

## 2017-02-15 DIAGNOSIS — N186 End stage renal disease: Secondary | ICD-10-CM | POA: Diagnosis not present

## 2017-07-30 ENCOUNTER — Ambulatory Visit (INDEPENDENT_AMBULATORY_CARE_PROVIDER_SITE_OTHER): Payer: Medicare Other | Admitting: Physician Assistant

## 2017-07-30 ENCOUNTER — Other Ambulatory Visit: Payer: Self-pay

## 2017-07-30 VITALS — BP 159/69 | HR 69 | Temp 97.8°F | Resp 16 | Ht 76.0 in | Wt 240.0 lb

## 2017-07-30 DIAGNOSIS — N186 End stage renal disease: Secondary | ICD-10-CM

## 2017-07-30 DIAGNOSIS — Z992 Dependence on renal dialysis: Secondary | ICD-10-CM | POA: Diagnosis not present

## 2017-07-30 MED ORDER — CEPHALEXIN 500 MG PO CAPS
500.0000 mg | ORAL_CAPSULE | Freq: Two times a day (BID) | ORAL | 0 refills | Status: AC
Start: 2017-07-30 — End: 2017-08-06

## 2017-07-30 NOTE — Progress Notes (Signed)
Established Dialysis Access   History of Present Illness   Bob Maldonado is a 62 y.o. ([birth year]) male who presents for re-evaluation of permanent access.  He is status post second stage brachial vein transposition by Dr. Bridgett Larsson in June 2015.  He has been dialyzing from this fistula without complication on a Monday Wednesday Friday schedule under the care of Dr. Florene Glen.  Patient is concerned about an area of pseudoaneurysmal degeneration at a stick site in his mid upper arm.  He has noticed some purulent drainage over the past couple days.  He denies any bleeding.  Patient states that the dialysis center is avoiding this area during treatment.  Patient also denies fevers, chills, nausea/vomiting.    Current Outpatient Medications  Medication Sig Dispense Refill  . atorvastatin (LIPITOR) 20 MG tablet Take 1 tablet (20 mg total) by mouth daily. 90 tablet 3  . cetirizine (ZYRTEC) 10 MG tablet Take 10 mg by mouth daily.    . cinacalcet (SENSIPAR) 30 MG tablet Take 30 mg by mouth daily.    . Ferric Citrate (AURYXIA) 1 GM 210 MG(Fe) TABS Take by mouth. 2 PO TID with meals and 1 PO BID with snacks    . fluticasone (FLONASE SENSIMIST) 27.5 MCG/SPRAY nasal spray Place 2 sprays into the nose daily.    . Multiple Vitamin (RENAL MULTIVITAMIN/ZINC PO) Take 1 tablet by mouth daily.    . Olopatadine HCl (PATADAY) 0.2 % SOLN 1 drop each eye daily prn 1 Bottle 1  . tamsulosin (FLOMAX) 0.4 MG CAPS capsule Take 1 capsule (0.4 mg total) by mouth 2 (two) times daily. 180 capsule 0  . Vitamin D, Ergocalciferol, (DRISDOL) 50000 units CAPS capsule TAKE 1 CAPSULE BY MOUTH ONCE A MONTH 12 capsule 0  . cephALEXin (KEFLEX) 500 MG capsule Take 1 capsule (500 mg total) by mouth 2 (two) times daily for 7 days. 14 capsule 0  . colchicine (COLCRYS) 0.6 MG tablet Take 1 tablet (0.6 mg total) by mouth daily. (Patient not taking: Reported on 07/30/2017) 90 tablet 3  . omeprazole (PRILOSEC) 20 MG capsule Take 20 mg by mouth  every morning.     No current facility-administered medications for this visit.     On ROS today: 10 system ROS is negative unless otherwise noted in HPI   Physical Examination   Vitals:   07/30/17 1306 07/30/17 1312  BP: (!) 145/61 (!) 159/69  Pulse: 68 69  Resp: 16   Temp: 97.8 F (36.6 C)   TempSrc: Oral   SpO2: 98%   Weight: 240 lb (108.9 kg)   Height: 6\' 4"  (1.93 m)    Body mass index is 29.21 kg/m.  General Alert, O x 3, WD, NAD  Pulmonary Sym exp, good B air movt,   Cardiac RRR, Nl S1, S2,   Musculo- skeletal  palpable left radial pulse; palpable thrill and audible bruit throughout AV fistula of left arm; pseudoaneurysm with skin degradation x2; area closer to elbow with small ulceration, no purulence noted with manipulation, no area of fluctuance  Neurologic A&O; CN grossly intact      Medical Decision Making   Bob Maldonado is a 62 y.o. 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   The brachiobrachial AV fistula of left arm has developed pseudoaneurysms in areas of repeated cannulation.  The area closer to the elbow has also developed an ulceration with purulent discharge per patient.  I discussed with the patient the  risk this presents for erosion through the fistula and the potential for significant bleeding which could be life-threatening.  We also discussed a plication procedure to remove to the diseased skin.  At this time the patient does not believe he will require a plication procedure.  Rather he would like to continue to avoid these areas during dialysis and see if ulceration will heal on its own.  I do believe that there is adequate length of usable fistula to avoid areas of skin degradation, and the patient will not require alternative means for dialysis.  I will prescribe 500 mg Keflex twice daily for 1 week.  He will follow-up in about 1-2 weeks to recheck fistula and discuss the necessity of a plication  procedure with Dr. Bridgett Larsson.  Patient was also educated to hold pressure in the event of a bleed and report to the emergency department for immediate medical care.  Dagoberto Ligas PA-C [facility] Office: [phone]

## 2017-07-30 NOTE — Progress Notes (Signed)
Vitals:   07/30/17 1306  BP: (!) 145/61  Pulse: 68  Resp: 16  Temp: 97.8 F (36.6 C)  TempSrc: Oral  SpO2: 98%  Weight: 240 lb (108.9 kg)  Height: 6\' 4"  (1.93 m)

## 2017-08-03 ENCOUNTER — Other Ambulatory Visit: Payer: Self-pay

## 2017-08-03 DIAGNOSIS — L98499 Non-pressure chronic ulcer of skin of other sites with unspecified severity: Secondary | ICD-10-CM

## 2017-08-17 ENCOUNTER — Ambulatory Visit: Payer: Medicare Other | Admitting: Vascular Surgery

## 2017-08-17 ENCOUNTER — Inpatient Hospital Stay (HOSPITAL_COMMUNITY): Admission: RE | Admit: 2017-08-17 | Payer: Medicare Other | Source: Ambulatory Visit

## 2017-08-17 ENCOUNTER — Encounter

## 2018-04-07 DIAGNOSIS — H35033 Hypertensive retinopathy, bilateral: Secondary | ICD-10-CM | POA: Diagnosis not present

## 2018-04-07 DIAGNOSIS — H4312 Vitreous hemorrhage, left eye: Secondary | ICD-10-CM | POA: Diagnosis not present

## 2018-04-07 DIAGNOSIS — E113513 Type 2 diabetes mellitus with proliferative diabetic retinopathy with macular edema, bilateral: Secondary | ICD-10-CM | POA: Diagnosis not present

## 2018-04-07 DIAGNOSIS — H3582 Retinal ischemia: Secondary | ICD-10-CM | POA: Diagnosis not present

## 2018-04-14 DIAGNOSIS — E113511 Type 2 diabetes mellitus with proliferative diabetic retinopathy with macular edema, right eye: Secondary | ICD-10-CM | POA: Diagnosis not present

## 2018-04-22 DIAGNOSIS — H4312 Vitreous hemorrhage, left eye: Secondary | ICD-10-CM | POA: Diagnosis not present

## 2018-04-28 DIAGNOSIS — E113513 Type 2 diabetes mellitus with proliferative diabetic retinopathy with macular edema, bilateral: Secondary | ICD-10-CM | POA: Diagnosis not present

## 2018-05-19 DIAGNOSIS — E113511 Type 2 diabetes mellitus with proliferative diabetic retinopathy with macular edema, right eye: Secondary | ICD-10-CM | POA: Diagnosis not present

## 2018-06-30 DIAGNOSIS — H4312 Vitreous hemorrhage, left eye: Secondary | ICD-10-CM | POA: Diagnosis not present

## 2018-06-30 DIAGNOSIS — E113513 Type 2 diabetes mellitus with proliferative diabetic retinopathy with macular edema, bilateral: Secondary | ICD-10-CM | POA: Diagnosis not present

## 2018-06-30 DIAGNOSIS — Z48810 Encounter for surgical aftercare following surgery on the sense organs: Secondary | ICD-10-CM | POA: Diagnosis not present

## 2018-07-05 ENCOUNTER — Ambulatory Visit (INDEPENDENT_AMBULATORY_CARE_PROVIDER_SITE_OTHER): Payer: Medicare Other | Admitting: Vascular Surgery

## 2018-07-05 ENCOUNTER — Encounter: Payer: Self-pay | Admitting: Vascular Surgery

## 2018-07-05 ENCOUNTER — Other Ambulatory Visit: Payer: Self-pay

## 2018-07-05 VITALS — BP 121/67 | HR 73 | Temp 98.2°F | Resp 20 | Ht 76.0 in | Wt 240.8 lb

## 2018-07-05 DIAGNOSIS — Z992 Dependence on renal dialysis: Secondary | ICD-10-CM

## 2018-07-05 DIAGNOSIS — N186 End stage renal disease: Secondary | ICD-10-CM | POA: Diagnosis not present

## 2018-07-05 NOTE — Progress Notes (Signed)
[facility]  Patient name: Bob Maldonado MRN: [id number] DOB: May 13, 1955 Sex: male  REASON FOR CONSULT: Evaluation of left upper arm AV fistula  HPI: Bob Maldonado is a 63 y.o. male, who is here today for evaluation of his left upper arm AV fistula.  He is a very pleasant 63 year old gentleman who is been on hemodialysis for just over 5 years.  He had basilic vein transposition fistula in 2015.  He has had standing use of this over a long period of time with no interventions since his fistula creation.  Seen today due to concerns of needing of skin over the fistula.  He has had no issues with bleeding and reports no difficulty with hemodialysis runs.  He is were awaiting kidney transplant through the [place] system in [place].  He has no steal symptoms.  Past Medical History:  Diagnosis Date  . Arthritis   . At risk for sleep apnea    STOP-BANG= 5      SENT PCP 03-30-2014  . ESRD (end stage renal disease) on dialysis ([place])   . ESRD on hemodialysis [facility])    Nephrologist-- Dr Eddie Dibbles--  [facility] in [place]-- M/W/F  . GERD (gastroesophageal reflux disease)   . Gout    per pt stable as of 03-30-2014  . Hemorrhoid   . Hyperlipidemia   . Hypertension   . Prostate cancer ([place]) first dx in 03/16/11, 08/29/13   Gleason 7, volume 55 mL (urologist-  dr Diona Fanti)  treatment External Beam Radiation therapy  . S/P radiation therapy 02/13/2014 through 03/21/2014    Prostate/seminal vesicles 4500 cGy in 25 sessions   . Type 2 diabetes mellitus ([place])   . Wears glasses     Family History  Problem Relation Age of Onset  . Diabetes Mother   . Heart disease Mother   . Heart attack Mother   . Cancer Father   . Heart disease Brother   . Colon cancer Neg Hx     SOCIAL HISTORY: Social History   Socioeconomic History  . Marital status:  Married    Spouse name: Not on file  . Number of children: Not on file  . Years of education: Not on file  . Highest education level: Not on file  Occupational History  . Not on file  Social Needs  . Financial resource strain: Not on file  . Food insecurity:    Worry: Not on file    Inability: Not on file  . Transportation needs:    Medical: Not on file    Non-medical: Not on file  Tobacco Use  . Smoking status: Former Smoker    Packs/day: 1.00    Years: 20.00    Pack years: 20.00    Types: Cigarettes    Last attempt to quit: 09/27/1990    Years since quitting: 27.7  . Smokeless tobacco: Never Used  Substance and Sexual Activity  . Alcohol use: No    Comment: rare  . Drug use: No  . Sexual activity: Not on file  Lifestyle  . Physical activity:    Days per week: Not on file    Minutes per session: Not on file  . Stress: Not on file  Relationships  . Social connections:    Talks on phone: Not on file    Gets together: Not on file    Attends religious service: Not on file    Active member of club or organization: Not on  file    Attends meetings of clubs or organizations: Not on file    Relationship status: Not on file  . Intimate partner violence:    Fear of current or ex partner: Not on file    Emotionally abused: Not on file    Physically abused: Not on file    Forced sexual activity: Not on file  Other Topics Concern  . Not on file  Social History Narrative  . Not on file    Allergies  Allergen Reactions  . Metoprolol Cough    Current Outpatient Medications  Medication Sig Dispense Refill  . ALBUTEROL IN Inhale into the lungs.    . ALLOPURINOL PO Take by mouth.    Marland Kitchen amLODipine (NORVASC) 10 MG tablet Take by mouth.    Marland Kitchen atorvastatin (LIPITOR) 20 MG tablet Take 1 tablet (20 mg total) by mouth daily. 90 tablet 3  . budesonide (PULMICORT FLEXHALER) 180 MCG/ACT inhaler Inhale into the lungs 2 (two) times daily.    . cetirizine (ZYRTEC) 10 MG tablet Take 10  mg by mouth daily.    . Ferric Citrate (AURYXIA) 1 GM 210 MG(Fe) TABS Take by mouth. 2 PO TID with meals and 1 PO BID with snacks    . fluticasone (FLONASE SENSIMIST) 27.5 MCG/SPRAY nasal spray Place 2 sprays into the nose daily.    Marland Kitchen gabapentin (NEURONTIN) 300 MG capsule Take 300 mg by mouth 3 (three) times daily.    . hydrOXYzine (ATARAX/VISTARIL) 25 MG tablet Take 25 mg by mouth 3 (three) times daily as needed.    . montelukast (SINGULAIR) 10 MG tablet Take 10 mg by mouth at bedtime.    . Multiple Vitamin (RENAL MULTIVITAMIN/ZINC PO) Take 1 tablet by mouth daily.    Marland Kitchen ofloxacin (OCUFLOX) 0.3 % ophthalmic solution 1 drop 4 (four) times daily.    . Olopatadine HCl (PATADAY) 0.2 % SOLN 1 drop each eye daily prn 1 Bottle 1  . pantoprazole (PROTONIX) 40 MG tablet Take 40 mg by mouth daily.    . tamsulosin (FLOMAX) 0.4 MG CAPS capsule Take 1 capsule (0.4 mg total) by mouth 2 (two) times daily. 180 capsule 0  . tiotropium (SPIRIVA) 18 MCG inhalation capsule Place 18 mcg into inhaler and inhale daily.    . Vitamin D, Ergocalciferol, (DRISDOL) 50000 units CAPS capsule TAKE 1 CAPSULE BY MOUTH ONCE A MONTH 12 capsule 0   No current facility-administered medications for this visit.     REVIEW OF SYSTEMS:  [X]  denotes positive finding, [ ]  denotes negative finding Cardiac  Comments:  Chest pain or chest pressure:    Shortness of breath upon exertion:    Short of breath when lying flat:    Irregular heart rhythm:        Vascular    Pain in calf, thigh, or hip brought on by ambulation:    Pain in feet at night that wakes you up from your sleep:     Blood clot in your veins:    Leg swelling:         Pulmonary    Oxygen at home:    Productive cough:     Wheezing:         Neurologic    Sudden weakness in arms or legs:     Sudden numbness in arms or legs:     Sudden onset of difficulty speaking or slurred speech:    Temporary loss of vision in one eye:     Problems with dizziness:  Gastrointestinal    Blood in stool:     Vomited blood:         Genitourinary    Burning when urinating:     Blood in urine:        Psychiatric    Major depression:         Hematologic    Bleeding problems:    Problems with blood clotting too easily:        Skin    Rashes or ulcers:        Constitutional    Fever or chills:      PHYSICAL EXAM: Vitals:   07/05/18 0925  BP: 121/67  Pulse: 73  Resp: 20  Temp: 98.2 F (36.8 C)  SpO2: 95%  Weight: 240 lb 12.8 oz (109.2 kg)  Height: 6\' 4"  (1.93 m)    GENERAL: The patient is a well-nourished male, in no acute distress. The vital signs are documented above. CARDIOVASCULAR: Plus left radial pulse.  Well developed left upper arm AV fistula.  He does have some hypopigmentation and thinning of skin over the fistula.  He does not have any ulceration.  He does have some very superficial small eschars over the puncture sites. PULMONARY: There is good air exchange  ABDOMEN: Soft and non-tender  MUSCULOSKELETAL: There are no major deformities or cyanosis. NEUROLOGIC: No focal weakness or paresthesias are detected. SKIN: There are no ulcers or rashes noted. PSYCHIATRIC: The patient has a normal affect.  DATA:  None  MEDICAL ISSUES: Long discussion with the patient regarding his fistula.  Explained that he has had extremely good function over a very long period of time with no difficulty.  Due to this he does have some thinning of the skin.  The access team is doing a good job of trying to avoid this thin hypopigmented area and I have asked him to encourage them to do this.  Also discussed the appearance of concerning ulcerations and the need to notify the center or [place] if this should occur.  I feel that he is safe to continue use of his fistula and hopefully he will obtain a transplant in the near future.  He will see [place] again on an as-needed basis   [name], MD [facility] Tel [phone] Pager [phone]

## 2018-09-13 DIAGNOSIS — N186 End stage renal disease: Secondary | ICD-10-CM | POA: Diagnosis not present

## 2018-09-13 DIAGNOSIS — T82858A Stenosis of vascular prosthetic devices, implants and grafts, initial encounter: Secondary | ICD-10-CM | POA: Diagnosis not present

## 2018-09-13 DIAGNOSIS — Z992 Dependence on renal dialysis: Secondary | ICD-10-CM | POA: Diagnosis not present

## 2018-09-13 DIAGNOSIS — I871 Compression of vein: Secondary | ICD-10-CM | POA: Diagnosis not present

## 2019-06-13 ENCOUNTER — Ambulatory Visit: Payer: Medicare Other

## 2019-06-26 ENCOUNTER — Telehealth (HOSPITAL_COMMUNITY): Payer: Self-pay

## 2019-06-26 NOTE — Telephone Encounter (Signed)

## 2019-06-27 ENCOUNTER — Other Ambulatory Visit: Payer: Self-pay

## 2019-06-27 ENCOUNTER — Other Ambulatory Visit (HOSPITAL_COMMUNITY)
Admission: RE | Admit: 2019-06-27 | Discharge: 2019-06-27 | Disposition: A | Discharge status: Home / Self Care | Payer: No Typology Code available for payment source | Source: Ambulatory Visit | Attending: Vascular Surgery | Admitting: Vascular Surgery

## 2019-06-27 ENCOUNTER — Ambulatory Visit (INDEPENDENT_AMBULATORY_CARE_PROVIDER_SITE_OTHER): Payer: No Typology Code available for payment source | Admitting: Physician Assistant

## 2019-06-27 VITALS — BP 109/63 | HR 68 | Temp 97.7°F | Resp 16 | Ht 76.0 in | Wt 238.0 lb

## 2019-06-27 DIAGNOSIS — Z01812 Encounter for preprocedural laboratory examination: Secondary | ICD-10-CM | POA: Insufficient documentation

## 2019-06-27 DIAGNOSIS — T82898D Other specified complication of vascular prosthetic devices, implants and grafts, subsequent encounter: Secondary | ICD-10-CM

## 2019-06-27 DIAGNOSIS — Z20822 Contact with and (suspected) exposure to covid-19: Secondary | ICD-10-CM | POA: Insufficient documentation

## 2019-06-27 LAB — SARS CORONAVIRUS 2 (TAT 6-24 HRS): SARS Coronavirus 2: NEGATIVE

## 2019-06-27 NOTE — Progress Notes (Signed)
  POST OPERATIVE OFFICE NOTE    CC:  Skin ulceration of left arm AVF HPI:  This is a 64 y.o. male who presents with skin ulceration overlying his left upper arm brachial vein A-V fistula.  He denies fever or chills; no hand pain or numbness.  No issues with HD treatment.  HD days: MWF HD center: [facility]   Allergies  Allergen Reactions  . Metoprolol Cough    Vitals:   06/27/19 0920  Weight: 238 lb (108 kg)  Height: 6\' 4"  (1.93 m)     ROS:  See HPI  Physical Exam: General appearance: Well-developed, well-nourished male in no apparent distress Cardiac: Heart rate and rhythm are regular Lungs: Clear to auscultation bilaterally Extremities: Right upper extremity: 1+ ulnar and radial pulses.  Grip strength is 5 out of 5.  Good bruit and thrill in graft.  An approximately 2 x 2 centimeter area of skin thinning and crusting over the mid fistula area.  No erythema or drainage Neuro: Alert and oriented x4     Assessment/Plan:  This is a 64 y.o. male who presents with ulceration overlying his left brachial vein fistula.  I reviewed the case with Dr. Donnetta Hutching.  We recommend revision of this area.  We explained the nature of the procedure and that on the day of surgery, recommendations will be made in regards to placement of tunneled dialysis catheter in the event the revision involves a significant area of the fistula.  We reviewed the risks of the procedure that include, but are not limited to, bleeding, infection, wound complications.  His questions are answered and he agrees to proceed with this plan.   Jannet Mantis, PA-C [facility] [phone]  Clinic MD:  Early is just signed

## 2019-06-27 NOTE — H&P (View-Only) (Signed)
  POST OPERATIVE OFFICE NOTE    CC:  Skin ulceration of left arm AVF HPI:  This is a 64 y.o. male who presents with skin ulceration overlying his left upper arm brachial vein A-V fistula.  He denies fever or chills; no hand pain or numbness.  No issues with HD treatment.  HD days: MWF HD center: [facility]   Allergies  Allergen Reactions  . Metoprolol Cough    Vitals:   06/27/19 0920  Weight: 238 lb (108 kg)  Height: 6\' 4"  (1.93 m)     ROS:  See HPI  Physical Exam: General appearance: Well-developed, well-nourished male in no apparent distress Cardiac: Heart rate and rhythm are regular Lungs: Clear to auscultation bilaterally Extremities: Right upper extremity: 1+ ulnar and radial pulses.  Grip strength is 5 out of 5.  Good bruit and thrill in graft.  An approximately 2 x 2 centimeter area of skin thinning and crusting over the mid fistula area.  No erythema or drainage Neuro: Alert and oriented x4     Assessment/Plan:  This is a 64 y.o. male who presents with ulceration overlying his left brachial vein fistula.  I reviewed the case with Dr. Donnetta Hutching.  We recommend revision of this area.  We explained the nature of the procedure and that on the day of surgery, recommendations will be made in regards to placement of tunneled dialysis catheter in the event the revision involves a significant area of the fistula.  We reviewed the risks of the procedure that include, but are not limited to, bleeding, infection, wound complications.  His questions are answered and he agrees to proceed with this plan.   Jannet Mantis, PA-C [facility] [phone]  Clinic MD:  Early is just signed

## 2019-06-28 ENCOUNTER — Other Ambulatory Visit: Payer: Self-pay

## 2019-06-28 ENCOUNTER — Encounter (HOSPITAL_COMMUNITY): Payer: Self-pay | Admitting: Vascular Surgery

## 2019-06-28 NOTE — Progress Notes (Signed)
Patient denies shortness of breath, fever, cough and chest pain.  [facility] [place] Cardiologist - n/a  Chest x-ray - n/a EKG - DOS 06/29/19 Stress Test - n/a ECHO - n/a Cardiac Cath - n/a  Sleep Apnea - Yes, does not use CPAP  Fasting Blood Sugar - unknown Checks Blood Sugar __0___ times a day Diet controlled, no meds.  Anesthesia review: Yes  STOP now taking any Aspirin (unless otherwise instructed by your surgeon), Aleve, Naproxen, Ibuprofen, Motrin, Advil, Goody's, BC's, all herbal medications, fish oil, and all vitamins.   Coronavirus Screening Covid test on 06/27/19 was negative.  Patient verbalized understanding of instructions that were given via phone.

## 2019-06-29 ENCOUNTER — Encounter (HOSPITAL_COMMUNITY): Payer: Self-pay | Admitting: Vascular Surgery

## 2019-06-29 ENCOUNTER — Ambulatory Visit (HOSPITAL_COMMUNITY): Payer: No Typology Code available for payment source | Admitting: Physician Assistant

## 2019-06-29 ENCOUNTER — Ambulatory Visit (HOSPITAL_COMMUNITY)
Admission: RE | Admit: 2019-06-29 | Discharge: 2019-06-29 | Disposition: A | Discharge status: Home / Self Care | Payer: No Typology Code available for payment source | Attending: Vascular Surgery | Admitting: Vascular Surgery

## 2019-06-29 ENCOUNTER — Encounter (HOSPITAL_COMMUNITY)
Admission: RE | Disposition: A | Discharge status: Home / Self Care | Payer: Self-pay | Source: Home / Self Care | Attending: Vascular Surgery

## 2019-06-29 DIAGNOSIS — Z8546 Personal history of malignant neoplasm of prostate: Secondary | ICD-10-CM | POA: Diagnosis not present

## 2019-06-29 DIAGNOSIS — N186 End stage renal disease: Secondary | ICD-10-CM

## 2019-06-29 DIAGNOSIS — Y9389 Activity, other specified: Secondary | ICD-10-CM | POA: Diagnosis not present

## 2019-06-29 DIAGNOSIS — X58XXXA Exposure to other specified factors, initial encounter: Secondary | ICD-10-CM | POA: Insufficient documentation

## 2019-06-29 DIAGNOSIS — T82898A Other specified complication of vascular prosthetic devices, implants and grafts, initial encounter: Secondary | ICD-10-CM

## 2019-06-29 DIAGNOSIS — E11622 Type 2 diabetes mellitus with other skin ulcer: Secondary | ICD-10-CM | POA: Insufficient documentation

## 2019-06-29 DIAGNOSIS — Z992 Dependence on renal dialysis: Secondary | ICD-10-CM | POA: Insufficient documentation

## 2019-06-29 DIAGNOSIS — L98499 Non-pressure chronic ulcer of skin of other sites with unspecified severity: Secondary | ICD-10-CM | POA: Diagnosis not present

## 2019-06-29 DIAGNOSIS — G4730 Sleep apnea, unspecified: Secondary | ICD-10-CM | POA: Insufficient documentation

## 2019-06-29 DIAGNOSIS — I120 Hypertensive chronic kidney disease with stage 5 chronic kidney disease or end stage renal disease: Secondary | ICD-10-CM | POA: Insufficient documentation

## 2019-06-29 DIAGNOSIS — K219 Gastro-esophageal reflux disease without esophagitis: Secondary | ICD-10-CM | POA: Insufficient documentation

## 2019-06-29 DIAGNOSIS — M1990 Unspecified osteoarthritis, unspecified site: Secondary | ICD-10-CM | POA: Insufficient documentation

## 2019-06-29 DIAGNOSIS — E1122 Type 2 diabetes mellitus with diabetic chronic kidney disease: Secondary | ICD-10-CM | POA: Insufficient documentation

## 2019-06-29 DIAGNOSIS — Z87891 Personal history of nicotine dependence: Secondary | ICD-10-CM | POA: Insufficient documentation

## 2019-06-29 DIAGNOSIS — J449 Chronic obstructive pulmonary disease, unspecified: Secondary | ICD-10-CM | POA: Diagnosis not present

## 2019-06-29 HISTORY — DX: Sleep apnea, unspecified: G47.30

## 2019-06-29 HISTORY — DX: Chronic obstructive pulmonary disease, unspecified: J44.9

## 2019-06-29 HISTORY — DX: Presence of dental prosthetic device (complete) (partial): Z97.2

## 2019-06-29 HISTORY — PX: REVISON OF ARTERIOVENOUS FISTULA: SHX6074

## 2019-06-29 LAB — POCT I-STAT, CHEM 8
BUN: 49 mg/dL — ABNORMAL HIGH (ref 8–23)
Calcium, Ion: 0.97 mmol/L — ABNORMAL LOW (ref 1.15–1.40)
Chloride: 94 mmol/L — ABNORMAL LOW (ref 98–111)
Creatinine, Ser: 11.5 mg/dL — ABNORMAL HIGH (ref 0.61–1.24)
Glucose, Bld: 180 mg/dL — ABNORMAL HIGH (ref 70–99)
HCT: 38 % — ABNORMAL LOW (ref 39.0–52.0)
Hemoglobin: 12.9 g/dL — ABNORMAL LOW (ref 13.0–17.0)
Potassium: 5 mmol/L (ref 3.5–5.1)
Sodium: 136 mmol/L (ref 135–145)
TCO2: 34 mmol/L — ABNORMAL HIGH (ref 22–32)

## 2019-06-29 LAB — GLUCOSE, CAPILLARY
Glucose-Capillary: 172 mg/dL — ABNORMAL HIGH (ref 70–99)
Glucose-Capillary: 148 mg/dL — ABNORMAL HIGH (ref 70–99)

## 2019-06-29 SURGERY — REVISON OF ARTERIOVENOUS FISTULA
Anesthesia: Monitor Anesthesia Care | Site: Arm Upper

## 2019-06-29 MED ORDER — PROPOFOL 500 MG/50ML IV EMUL
INTRAVENOUS | Status: DC | PRN
  Administered 2019-06-29: 40 ug/kg/min via INTRAVENOUS

## 2019-06-29 MED ORDER — CEFAZOLIN SODIUM-DEXTROSE 2-4 GM/100ML-% IV SOLN
2.0000 g | INTRAVENOUS | Status: AC
  Administered 2019-06-29: 2 g via INTRAVENOUS

## 2019-06-29 MED ORDER — PROMETHAZINE HCL 25 MG/ML IJ SOLN: 6.2500 mg | INTRAMUSCULAR | Status: DC | PRN

## 2019-06-29 MED ORDER — SODIUM CHLORIDE 0.9 % IV SOLN
INTRAVENOUS | Status: AC
  Filled 2019-06-29: qty 1.2

## 2019-06-29 MED ORDER — LIDOCAINE-EPINEPHRINE 0.5 %-1:200000 IJ SOLN
INTRAMUSCULAR | Status: AC
  Filled 2019-06-29: qty 1

## 2019-06-29 MED ORDER — FENTANYL CITRATE (PF) 250 MCG/5ML IJ SOLN
INTRAMUSCULAR | Status: AC
  Filled 2019-06-29: qty 5

## 2019-06-29 MED ORDER — FENTANYL CITRATE (PF) 100 MCG/2ML IJ SOLN: 25.0000 ug | INTRAMUSCULAR | Status: DC | PRN

## 2019-06-29 MED ORDER — PHENYLEPHRINE HCL-NACL 10-0.9 MG/250ML-% IV SOLN
INTRAVENOUS | Status: DC | PRN
  Administered 2019-06-29: 25 ug/min via INTRAVENOUS

## 2019-06-29 MED ORDER — MIDAZOLAM HCL 2 MG/2ML IJ SOLN: 0.5000 mg | Freq: Once | INTRAMUSCULAR | Status: DC | PRN

## 2019-06-29 MED ORDER — CHLORHEXIDINE GLUCONATE 4 % EX LIQD: 60.0000 mL | Freq: Once | CUTANEOUS | Status: DC

## 2019-06-29 MED ORDER — LIDOCAINE-EPINEPHRINE 0.5 %-1:200000 IJ SOLN
INTRAMUSCULAR | Status: DC | PRN
  Administered 2019-06-29: 50 mL

## 2019-06-29 MED ORDER — LIDOCAINE 2% (20 MG/ML) 5 ML SYRINGE
INTRAMUSCULAR | Status: AC
  Filled 2019-06-29: qty 5

## 2019-06-29 MED ORDER — FENTANYL CITRATE (PF) 100 MCG/2ML IJ SOLN
INTRAMUSCULAR | Status: DC | PRN
  Administered 2019-06-29 (×2): 50 ug via INTRAVENOUS

## 2019-06-29 MED ORDER — MIDAZOLAM HCL 5 MG/5ML IJ SOLN
INTRAMUSCULAR | Status: DC | PRN
  Administered 2019-06-29 (×2): 1 mg via INTRAVENOUS

## 2019-06-29 MED ORDER — MEPERIDINE HCL 25 MG/ML IJ SOLN: 6.2500 mg | INTRAMUSCULAR | Status: DC | PRN

## 2019-06-29 MED ORDER — SODIUM CHLORIDE 0.9 % IV SOLN: INTRAVENOUS | Status: DC

## 2019-06-29 MED ORDER — 0.9 % SODIUM CHLORIDE (POUR BTL) OPTIME
TOPICAL | Status: DC | PRN
  Administered 2019-06-29: 1000 mL

## 2019-06-29 MED ORDER — SODIUM CHLORIDE 0.9 % IV SOLN
INTRAVENOUS | Status: DC | PRN
  Administered 2019-06-29: 500 mL

## 2019-06-29 MED ORDER — PROPOFOL 10 MG/ML IV BOLUS
INTRAVENOUS | Status: AC
  Filled 2019-06-29: qty 40

## 2019-06-29 MED ORDER — MIDAZOLAM HCL 2 MG/2ML IJ SOLN
INTRAMUSCULAR | Status: AC
  Filled 2019-06-29: qty 2

## 2019-06-29 MED ORDER — OXYCODONE-ACETAMINOPHEN 5-325 MG PO TABS: 1.0000 | ORAL_TABLET | Freq: Four times a day (QID) | ORAL | 0 refills | Status: DC | PRN

## 2019-06-29 SURGICAL SUPPLY — 56 items
ADH SKN CLS APL DERMABOND .7 (GAUZE/BANDAGES/DRESSINGS) ×1
ARMBAND PINK RESTRICT EXTREMIT (MISCELLANEOUS) ×3 IMPLANT
BAG DECANTER FOR FLEXI CONT (MISCELLANEOUS) ×3 IMPLANT
BIOPATCH RED 1 DISK 7.0 (GAUZE/BANDAGES/DRESSINGS) ×2 IMPLANT
CANISTER SUCT 3000ML PPV (MISCELLANEOUS) ×3 IMPLANT
CANNULA VESSEL 3MM 2 BLNT TIP (CANNULA) ×3 IMPLANT
CATH PALINDROME-P 19CM W/VT (CATHETERS) IMPLANT
CATH PALINDROME-P 23CM W/VT (CATHETERS) IMPLANT
CATH PALINDROME-P 28CM W/VT (CATHETERS) IMPLANT
CLIP LIGATING EXTRA MED SLVR (CLIP) ×3 IMPLANT
CLIP LIGATING EXTRA SM BLUE (MISCELLANEOUS) ×3 IMPLANT
COVER PROBE W GEL 5X96 (DRAPES) ×3 IMPLANT
COVER SURGICAL LIGHT HANDLE (MISCELLANEOUS) ×3 IMPLANT
COVER WAND RF STERILE (DRAPES) ×2 IMPLANT
DECANTER SPIKE VIAL GLASS SM (MISCELLANEOUS) ×3 IMPLANT
DERMABOND ADVANCED (GAUZE/BANDAGES/DRESSINGS) ×1
DERMABOND ADVANCED .7 DNX12 (GAUZE/BANDAGES/DRESSINGS) ×2 IMPLANT
DRAPE C-ARM 42X72 X-RAY (DRAPES) ×2 IMPLANT
DRAPE CHEST BREAST 15X10 FENES (DRAPES) ×2 IMPLANT
ELECT REM PT RETURN 9FT ADLT (ELECTROSURGICAL) ×3
ELECTRODE REM PT RTRN 9FT ADLT (ELECTROSURGICAL) ×2 IMPLANT
GAUZE 4X4 16PLY RFD (DISPOSABLE) ×3 IMPLANT
GLOVE BIO SURGEON STRL SZ 6.5 (GLOVE) ×4 IMPLANT
GLOVE BIOGEL PI IND STRL 6.5 (GLOVE) IMPLANT
GLOVE BIOGEL PI INDICATOR 6.5 (GLOVE) ×1
GLOVE SS BIOGEL STRL SZ 7.5 (GLOVE) ×2 IMPLANT
GLOVE SUPERSENSE BIOGEL SZ 7.5 (GLOVE) ×1
GOWN STRL REUS W/ TWL LRG LVL3 (GOWN DISPOSABLE) ×6 IMPLANT
GOWN STRL REUS W/TWL LRG LVL3 (GOWN DISPOSABLE) ×9
KIT BASIN OR (CUSTOM PROCEDURE TRAY) ×3 IMPLANT
KIT PALINDROME-P 55CM (CATHETERS) IMPLANT
KIT TURNOVER KIT B (KITS) ×3 IMPLANT
NDL 18GX1X1/2 (RX/OR ONLY) (NEEDLE) ×2 IMPLANT
NDL HYPO 25GX1X1/2 BEV (NEEDLE) ×2 IMPLANT
NEEDLE 18GX1X1/2 (RX/OR ONLY) (NEEDLE) ×3 IMPLANT
NEEDLE 22X1 1/2 (OR ONLY) (NEEDLE) IMPLANT
NEEDLE HYPO 25GX1X1/2 BEV (NEEDLE) ×3 IMPLANT
NS IRRIG 1000ML POUR BTL (IV SOLUTION) ×3 IMPLANT
PACK CV ACCESS (CUSTOM PROCEDURE TRAY) ×3 IMPLANT
PACK SURGICAL SETUP 50X90 (CUSTOM PROCEDURE TRAY) ×3 IMPLANT
PAD ARMBOARD 7.5X6 YLW CONV (MISCELLANEOUS) ×6 IMPLANT
SOAP 2 % CHG 4 OZ (WOUND CARE) ×2 IMPLANT
SUT ETHILON 3 0 PS 1 (SUTURE) ×2 IMPLANT
SUT PROLENE 5 0 C 1 24 (SUTURE) ×3 IMPLANT
SUT PROLENE 6 0 CC (SUTURE) ×3 IMPLANT
SUT VIC AB 3-0 SH 27 (SUTURE) ×3
SUT VIC AB 3-0 SH 27X BRD (SUTURE) ×2 IMPLANT
SUT VICRYL 4-0 PS2 18IN ABS (SUTURE) ×2 IMPLANT
SYR 10ML LL (SYRINGE) ×3 IMPLANT
SYR 20ML LL LF (SYRINGE) ×3 IMPLANT
SYR 5ML LL (SYRINGE) ×6 IMPLANT
SYR CONTROL 10ML LL (SYRINGE) ×3 IMPLANT
TOWEL GREEN STERILE (TOWEL DISPOSABLE) ×6 IMPLANT
TOWEL GREEN STERILE FF (TOWEL DISPOSABLE) ×3 IMPLANT
UNDERPAD 30X36 HEAVY ABSORB (UNDERPADS AND DIAPERS) ×3 IMPLANT
WATER STERILE IRR 1000ML POUR (IV SOLUTION) ×3 IMPLANT

## 2019-06-29 NOTE — Discharge Instructions (Signed)
[facility]  Discharge Instructions  AV Fistula or Graft Surgery for Dialysis Access  Please refer to the following instructions for your post-procedure care. Your surgeon or physician assistant will discuss any changes with you.  Activity  You may drive the day following your surgery, if you are comfortable and no longer taking prescription pain medication. Resume full activity as the soreness in your incision resolves.  Bathing/Showering  You may shower after you go home. Keep your incision dry for 48 hours. Do not soak in a bathtub, hot tub, or swim until the incision heals completely. You may not shower if you have a hemodialysis catheter.  Incision Care  Clean your incision with mild soap and water after 48 hours. Pat the area dry with a clean towel. You do not need a bandage unless otherwise instructed. Do not apply any ointments or creams to your incision. You may have skin glue on your incision. Do not peel it off. It will come off on its own in about one week. Your arm may swell a bit after surgery. To reduce swelling use pillows to elevate your arm so it is above your heart. Your doctor will tell you if you need to lightly wrap your arm with an ACE bandage.  Diet  Resume your normal diet. There are not special food restrictions following this procedure. In order to heal from your surgery, it is CRITICAL to get adequate nutrition. Your body requires vitamins, minerals, and protein. Vegetables are the best source of vitamins and minerals. Vegetables also provide the perfect balance of protein. Processed food has little nutritional value, so try to avoid this.  Medications  Resume taking all of your medications. If your incision is causing pain, you may take over-the counter pain relievers such as acetaminophen (Tylenol). If you were prescribed a stronger pain medication, please be aware these medications can cause nausea and constipation. Prevent  nausea by taking the medication with a snack or meal. Avoid constipation by drinking plenty of fluids and eating foods with high amount of fiber, such as fruits, vegetables, and grains.  Do not take Tylenol if you are taking prescription pain medications.  Follow up Your surgeon may want to see you in the office following your access surgery. If so, this will be arranged at the time of your surgery.  Please call us immediately for any of the following conditions:  . Increased pain, redness, drainage (pus) from your incision site . Fever of 101 degrees or higher . Severe or worsening pain at your incision site . Hand pain or numbness. .  Reduce your risk of vascular disease:  . Stop smoking. If you would like help, call [facility] at 1-800-QUIT-NOW 703-190-8665) or [place] at [phone]  . Manage your cholesterol . Maintain a desired weight . Control your diabetes . Keep your blood pressure down  Dialysis  It will take several weeks to several months for your new dialysis access to be ready for use. Your surgeon will determine when it is okay to use it. Your nephrologist will continue to direct your dialysis. You can continue to use your Permcath until your new access is ready for use.   06/29/2019 [facility] [id number] 29-Oct-1955  Surgeon(s): Early, Arvilla Meres, MD  Procedure(s): REVISON OF LEFT BASILIC VEIN FISTULA  x May stick graft on designated area only:  Do NOT stick fistula over incision for 4 weeks. May stick above and below incision now. SEE DIAGRAM  If you have any questions, please call the office at [phone].

## 2019-06-29 NOTE — Transfer of Care (Signed)
Immediate Anesthesia Transfer of Care Note  Patient: [facility]  Procedure(s) Performed: REVISON OF LEFT BASILIC VEIN FISTULA (Left Arm Upper)  Patient Location: PACU  Anesthesia Type:MAC  Level of Consciousness: awake, alert , oriented and sedated  Airway & Oxygen Therapy: Patient Spontanous Breathing  Post-op Assessment: Report given to RN, Post -op Vital signs reviewed and stable and Patient moving all extremities  Post vital signs: Reviewed and stable  Last Vitals:  Vitals Value Taken Time  BP 110/63 06/29/19 1107  Temp    Pulse 62 06/29/19 1108  Resp 9 06/29/19 1108  SpO2 98 % 06/29/19 1108  Vitals shown include unvalidated device data.  Last Pain:  Vitals:   06/29/19 0749  TempSrc:   PainSc: 0-No pain         Complications: No apparent anesthesia complications

## 2019-06-29 NOTE — Op Note (Signed)
    OPERATIVE REPORT  DATE OF SURGERY: 06/29/2019  PATIENT: [facility], 64 y.o. male MRN: [id number]  DOB: [birth year]  PRE-OPERATIVE DIAGNOSIS: End-stage renal disease with erosion over left brachiobasilic fistula  POST-OPERATIVE DIAGNOSIS:  Same  PROCEDURE: Revision with excision of overlying skin and plication of vein of eroded segment of brachiobasilic fistula  SURGEON:  Curt Jews, M.D.  PHYSICIAN ASSISTANT: Liana Crocker, PA-C  ANESTHESIA: Local with sedation  EBL: per anesthesia record  Total I/O In: 200 [I.V.:200] Out: 115 [Urine:100; Blood:15]  BLOOD ADMINISTERED: none  DRAINS: none  SPECIMEN: none  COUNTS CORRECT:  YES  PATIENT DISPOSITION:  PACU - hemodynamically stable  PROCEDURE DETAILS: Patient was taken the operating placed supine position where the area of the left arm prepped draped in sterile fashion.  Using local anesthesia and an ellipse was made around the eroded area of skin which is above the antecubital space on the more distal area of the arm.  The dissection was trended down to the vein and the vein was encircled circumferentially above and below the eroded segment.  The vein was occluded proximally and distally and the skin was removed off of this eroded segment and the segment of vein was excised.  The vein was closed longitudinally with 2 layers of 5-0 Prolene suture.  Clamps removed and good anastomosis was encountered.  The wound was irrigated with saline and hemostasis electrocautery.  The wound was closed with 3-0 Vicryl in the subcuticular tissue.  Sterile dressing was applied and the patient was transferred to the recovery room in stable condition   [name], M.D., [facility] 06/29/2019 11:10 AM

## 2019-06-29 NOTE — Anesthesia Preprocedure Evaluation (Addendum)
Anesthesia Evaluation  Patient identified by MRN, date of birth, ID band Patient awake    Reviewed: Allergy & Precautions, NPO status , Patient's Chart, lab work & pertinent test results  History of Anesthesia Complications Negative for: history of anesthetic complications  Airway Mallampati: II  TM Distance: >3 FB Neck ROM: Full    Dental  (+) Dental Advisory Given   Pulmonary sleep apnea (does not use CPAP) , COPD,  COPD inhaler, former smoker,  06/27/2019 SARS coronavirus NEG   breath sounds clear to auscultation       Cardiovascular hypertension, Pt. on medications  Rhythm:Regular Rate:Normal     Neuro/Psych negative neurological ROS     GI/Hepatic Neg liver ROS, GERD  Medicated,  Endo/Other  diabetes (controlled with dialysis and diet, glu 172)  Renal/GU ESRF and DialysisRenal disease (MWF, K+ 5.0)     Musculoskeletal  (+) Arthritis ,   Abdominal   Peds  Hematology negative hematology ROS (+)   Anesthesia Other Findings H/o prostate cancer  Reproductive/Obstetrics                           Anesthesia Physical Anesthesia Plan  ASA: III  Anesthesia Plan: MAC   Post-op Pain Management:    Induction:   PONV Risk Score and Plan: 1 and Treatment may vary due to age or medical condition  Airway Management Planned: Natural Airway and Simple Face Mask  Additional Equipment: None  Intra-op Plan:   Post-operative Plan:   Informed Consent: I have reviewed the patients History and Physical, chart, labs and discussed the procedure including the risks, benefits and alternatives for the proposed anesthesia with the patient or authorized representative who has indicated his/her understanding and acceptance.     Dental advisory given  Plan Discussed with: CRNA and Surgeon  Anesthesia Plan Comments:         Anesthesia Quick Evaluation

## 2019-06-29 NOTE — Anesthesia Postprocedure Evaluation (Signed)
Anesthesia Post Note  Patient: [facility]  Procedure(s) Performed: REVISON OF LEFT BASILIC VEIN FISTULA (Left Arm Upper)     Anesthesia Post Evaluation  Last Vitals:  Vitals:   06/29/19 1130 06/29/19 1145  BP: 111/64 110/67  Pulse: 67 62  Resp: 16 16  Temp:  36.6 C  SpO2: 99% 99%    Last Pain:  Vitals:   06/29/19 1145  TempSrc:   PainSc: 0-No pain                 Alonah Lineback,E. Reva Pinkley

## 2019-06-29 NOTE — Interval H&P Note (Signed)
History and Physical Interval Note:  06/29/2019 6:59 AM  [facility]  has presented today for surgery, with the diagnosis of END STAGE RENAL DISEASE.  The various methods of treatment have been discussed with the patient and family. After consideration of risks, benefits and other options for treatment, the patient has consented to  Procedure(s): REVISON OF LEFT BASILIC VEIN FISTULA (Left) INSERTION OF DIALYSIS CATHETER (N/A) as a surgical intervention.  The patient's history has been reviewed, patient examined, no change in status, stable for surgery.  I have reviewed the patient's chart and labs.  Questions were answered to the patient's satisfaction.     Curt Jews

## 2019-12-13 ENCOUNTER — Other Ambulatory Visit: Payer: Self-pay | Admitting: Preventative Medicine

## 2019-12-13 ENCOUNTER — Other Ambulatory Visit (HOSPITAL_COMMUNITY): Payer: Self-pay | Admitting: Preventative Medicine

## 2019-12-13 DIAGNOSIS — Z940 Kidney transplant status: Secondary | ICD-10-CM

## 2020-01-04 ENCOUNTER — Ambulatory Visit (HOSPITAL_COMMUNITY): Payer: No Typology Code available for payment source

## 2020-02-05 ENCOUNTER — Ambulatory Visit: Payer: No Typology Code available for payment source | Admitting: Vascular Surgery

## 2020-02-26 ENCOUNTER — Other Ambulatory Visit: Payer: Self-pay

## 2020-02-26 ENCOUNTER — Ambulatory Visit (INDEPENDENT_AMBULATORY_CARE_PROVIDER_SITE_OTHER): Payer: Medicare PPO | Admitting: Vascular Surgery

## 2020-02-26 ENCOUNTER — Encounter: Payer: Self-pay | Admitting: Vascular Surgery

## 2020-02-26 VITALS — BP 143/74 | HR 65 | Temp 98.3°F | Resp 12 | Ht 76.0 in | Wt 241.0 lb

## 2020-02-26 DIAGNOSIS — N186 End stage renal disease: Secondary | ICD-10-CM

## 2020-02-26 DIAGNOSIS — Z992 Dependence on renal dialysis: Secondary | ICD-10-CM

## 2020-02-26 NOTE — Progress Notes (Signed)
[facility] [place]  Patient name: Bob [facility]donado MRN: [id number] DOB: 1956-01-07 Sex: male  REASON FOR VISIT: Evaluation left upper arm AV fistula.  HPI: Bob Maldonado is a 65 y.o. male here today for evaluation of his left upper arm AV fistula.  He had undergone a prior left basilic vein transposition fistula in 2015.  I evaluated and treated him in May 2021.  At that time he had erosion of the more distal portion of his fistula above his antecubital space.  He underwent excision of this eroded area and plication of the vein.  He is here today for concern regarding depigmentation of the more proximal upper area over his fistula.  He reports no pain associated with this and no prolonged bleeding.  He reports that the dialysis access staff is able to access his fistula reliably without difficulty.  Past Medical History:  Diagnosis Date  . Arthritis   . At risk for sleep apnea    STOP-BANG= 5      SENT PCP 03-30-2014  . COPD (chronic obstructive pulmonary disease) ([place])   . ESRD (end stage renal disease) on dialysis ([facility])   . ESRD on hemodialysis [facility])    Nephrologist-- Dr Eddie Dibbles--  [facility] in [place]-- M/W/F  . GERD (gastroesophageal reflux disease)   . Gout    per pt stable as of 03-30-2014  . Hemorrhoid   . Hyperlipidemia   . Hypertension   . Prostate cancer ([facility]) first dx in 03/16/11, 08/29/13   Gleason 7, volume 55 mL (urologist-  dr Diona Fanti)  treatment External Beam Radiation therapy  . S/P radiation therapy 02/13/2014 through 03/21/2014    Prostate/seminal vesicles 4500 cGy in 25 sessions   . Sleep apnea    does not use cpap  . Type 2 diabetes mellitus (HCC)    diet controlled - no meds  . Wears dentures   . Wears glasses     Family History  Problem Relation Age of Onset  . Diabetes Mother   . Heart disease Mother   . Heart  attack Mother   . Cancer Father   . Heart disease Brother   . Colon cancer Neg Hx     SOCIAL HISTORY: Social History   Tobacco Use  . Smoking status: Former Smoker    Packs/day: 1.00    Years: 20.00    Pack years: 20.00    Types: Cigarettes    Quit date: 09/27/1990    Years since quitting: 29.4  . Smokeless tobacco: Never Used  Substance Use Topics  . Alcohol use: Not Currently    Comment: Rarely    Allergies  Allergen Reactions  . Metoprolol Cough    Current Outpatient Medications  Medication Sig Dispense Refill  . ALBUTEROL IN Inhale into the lungs. Albuterol Inhaler as needed    . ALLOPURINOL PO Take by mouth at bedtime.     Marland Kitchen amLODipine (NORVASC) 10 MG tablet Take by mouth at bedtime.     Marland Kitchen atorvastatin (LIPITOR) 20 MG tablet Take 1 tablet (20 mg total) by mouth daily. (Patient taking differently: Take 20 mg by mouth at bedtime.) 90 tablet 3  . budesonide (PULMICORT) 180 MCG/ACT inhaler Inhale into the lungs 2 (two) times daily.    . cetirizine (ZYRTEC) 10 MG tablet Take 10 mg by mouth at bedtime.     . ferric citrate (AURYXIA) 1 GM 210 MG(Fe) tablet Take by mouth. 2 PO TID with meals and 1 PO BID  with snacks    . fluticasone (FLONASE SENSIMIST) 27.5 MCG/SPRAY nasal spray Place 2 sprays into the nose daily.     Marland Kitchen gabapentin (NEURONTIN) 300 MG capsule Take 300 mg by mouth at bedtime.     . montelukast (SINGULAIR) 10 MG tablet Take 10 mg by mouth at bedtime.    . Multiple Vitamin (RENAL MULTIVITAMIN/ZINC PO) Take 1 tablet by mouth at bedtime.     Marland Kitchen ofloxacin (OCUFLOX) 0.3 % ophthalmic solution 1 drop 4 (four) times daily.    . Olopatadine HCl (PATADAY) 0.2 % SOLN 1 drop each eye daily prn 1 Bottle 1  . pantoprazole (PROTONIX) 40 MG tablet Take 40 mg by mouth at bedtime.     . tamsulosin (FLOMAX) 0.4 MG CAPS capsule Take 1 capsule (0.4 mg total) by mouth 2 (two) times daily. 180 capsule 0  . tiotropium (SPIRIVA) 18 MCG inhalation capsule Place 18 mcg into inhaler and  inhale daily.    . Vitamin D, Ergocalciferol, (DRISDOL) 50000 units CAPS capsule TAKE 1 CAPSULE BY MOUTH ONCE A MONTH 12 capsule 0   No current facility-administered medications for this visit.    REVIEW OF SYSTEMS:  [X]  denotes positive finding, [ ]  denotes negative finding Cardiac  Comments:  Chest pain or chest pressure:    Shortness of breath upon exertion:    Short of breath when lying flat:    Irregular heart rhythm:        Vascular    Pain in calf, thigh, or hip brought on by ambulation:    Pain in feet at night that wakes you up from your sleep:     Blood clot in your veins:    Leg swelling:           PHYSICAL EXAM: Vitals:   02/26/20 1021  BP: (!) 143/74  Pulse: 65  Resp: 12  Temp: 98.3 F (36.8 C)  TempSrc: Oral  SpO2: 98%  Weight: 241 lb (109.3 kg)  Height: 6\' 4"  (1.93 m)    GENERAL: The patient is a well-nourished male, in no acute distress. The vital signs are documented above. CARDIOVASCULAR: 2+ radial pulse distal to his left upper arm fistula.  He does have an excellent thrill in his fistula.  There is no skin breakdown.  He does have an area of hypopigmentation over the upper portion of the fistula near his axilla.  The skin is not eroded in this area and is mobile over the fistula. PULMONARY: There is good air exchange  MUSCULOSKELETAL: There are no major deformities or cyanosis. NEUROLOGIC: No focal weakness or paresthesias are detected. SKIN: There are no ulcers or rashes noted. PSYCHIATRIC: The patient has a normal affect.  DATA:  None  MEDICAL ISSUES: I discussed these findings with the patient.  I do not feel that he has any risk of rupture or bleeding with intact skin over his fistula.  The dialysis staff is not having any difficulty accessing his fistula around the area of hypopigmentation in the upper segment.  I have recommended continued use of the fistula and again cautioned him on what to be concerned about specifically erosion of the skin  over the fistula.  He reports that he is undergoing evaluation by the 10th transplant team at [facility].  He will continue his dialysis via his left upper arm fistula and will see [place] for any difficulty    [name], MD [facility] Tel [phone]

## 2020-05-01 ENCOUNTER — Telehealth: Payer: Self-pay | Admitting: *Deleted

## 2020-05-01 NOTE — Telephone Encounter (Signed)
Transition Care Management Follow-up Telephone Call  Date of discharge and from where: 04/30/2020 [facility]  How have you been since you were released from the hospital? "I am doing the best I can"  Any questions or concerns? No  Items Reviewed:  Did the pt receive and understand the discharge instructions provided? Yes   Medications obtained and verified? Yes   Other? No   Any new allergies since your discharge? No   Dietary orders reviewed? Yes  Do you have support at home? Yes   Home Care and Equipment/Supplies: Were home health services ordered? not applicable If so, what is the name of the agency? N/A  Has the agency set up a time to come to the patient's home? not applicable Were any new equipment or medical supplies ordered?  No What is the name of the medical supply agency? N/A Were you able to get the supplies/equipment? not applicable Do you have any questions related to the use of the equipment or supplies? No  Functional Questionnaire: (I = Independent and D = Dependent) ADLs: I  Bathing/Dressing- I  Meal Prep- I  Eating- I  Maintaining continence- I  Transferring/Ambulation- I  Managing Meds- I  Follow up appointments reviewed:   PCP Hospital f/u appt confirmed? No    Specialist Hospital f/u appt confirmed? Yes  - has several appointments with the [facility]  Are transportation arrangements needed? No   If their condition worsens, is the pt aware to call PCP or go to the Emergency Dept.? Yes  Was the patient provided with contact information for the PCP's office or ED? Yes  Was to pt encouraged to call back with questions or concerns? Yes

## 2020-05-14 ENCOUNTER — Emergency Department (HOSPITAL_COMMUNITY): Payer: No Typology Code available for payment source

## 2020-05-14 ENCOUNTER — Other Ambulatory Visit: Payer: Self-pay

## 2020-05-14 ENCOUNTER — Encounter (HOSPITAL_COMMUNITY): Payer: Self-pay | Admitting: *Deleted

## 2020-05-14 ENCOUNTER — Inpatient Hospital Stay (HOSPITAL_COMMUNITY)
Admission: EM | Admit: 2020-05-14 | Discharge: 2020-05-16 | DRG: 641 | Disposition: A | Discharge status: Home Health | Payer: No Typology Code available for payment source | Attending: Family Medicine | Admitting: Family Medicine

## 2020-05-14 DIAGNOSIS — E875 Hyperkalemia: Secondary | ICD-10-CM | POA: Diagnosis present

## 2020-05-14 DIAGNOSIS — Z888 Allergy status to other drugs, medicaments and biological substances status: Secondary | ICD-10-CM

## 2020-05-14 DIAGNOSIS — E872 Acidosis: Secondary | ICD-10-CM | POA: Diagnosis present

## 2020-05-14 DIAGNOSIS — K219 Gastro-esophageal reflux disease without esophagitis: Secondary | ICD-10-CM | POA: Diagnosis present

## 2020-05-14 DIAGNOSIS — E1142 Type 2 diabetes mellitus with diabetic polyneuropathy: Secondary | ICD-10-CM | POA: Diagnosis present

## 2020-05-14 DIAGNOSIS — Z79899 Other long term (current) drug therapy: Secondary | ICD-10-CM

## 2020-05-14 DIAGNOSIS — Z8249 Family history of ischemic heart disease and other diseases of the circulatory system: Secondary | ICD-10-CM

## 2020-05-14 DIAGNOSIS — K529 Noninfective gastroenteritis and colitis, unspecified: Secondary | ICD-10-CM | POA: Diagnosis present

## 2020-05-14 DIAGNOSIS — Z87891 Personal history of nicotine dependence: Secondary | ICD-10-CM | POA: Diagnosis not present

## 2020-05-14 DIAGNOSIS — I132 Hypertensive heart and chronic kidney disease with heart failure and with stage 5 chronic kidney disease, or end stage renal disease: Secondary | ICD-10-CM | POA: Diagnosis present

## 2020-05-14 DIAGNOSIS — I10 Essential (primary) hypertension: Secondary | ICD-10-CM

## 2020-05-14 DIAGNOSIS — E1122 Type 2 diabetes mellitus with diabetic chronic kidney disease: Secondary | ICD-10-CM | POA: Diagnosis present

## 2020-05-14 DIAGNOSIS — R112 Nausea with vomiting, unspecified: Secondary | ICD-10-CM

## 2020-05-14 DIAGNOSIS — E785 Hyperlipidemia, unspecified: Secondary | ICD-10-CM | POA: Diagnosis present

## 2020-05-14 DIAGNOSIS — E1165 Type 2 diabetes mellitus with hyperglycemia: Secondary | ICD-10-CM | POA: Diagnosis present

## 2020-05-14 DIAGNOSIS — Z940 Kidney transplant status: Secondary | ICD-10-CM

## 2020-05-14 DIAGNOSIS — D72829 Elevated white blood cell count, unspecified: Secondary | ICD-10-CM

## 2020-05-14 DIAGNOSIS — G4733 Obstructive sleep apnea (adult) (pediatric): Secondary | ICD-10-CM | POA: Diagnosis present

## 2020-05-14 DIAGNOSIS — Z8546 Personal history of malignant neoplasm of prostate: Secondary | ICD-10-CM

## 2020-05-14 DIAGNOSIS — Z923 Personal history of irradiation: Secondary | ICD-10-CM

## 2020-05-14 DIAGNOSIS — Z809 Family history of malignant neoplasm, unspecified: Secondary | ICD-10-CM

## 2020-05-14 DIAGNOSIS — E782 Mixed hyperlipidemia: Secondary | ICD-10-CM | POA: Diagnosis not present

## 2020-05-14 DIAGNOSIS — D539 Nutritional anemia, unspecified: Secondary | ICD-10-CM | POA: Diagnosis not present

## 2020-05-14 DIAGNOSIS — R197 Diarrhea, unspecified: Secondary | ICD-10-CM

## 2020-05-14 DIAGNOSIS — R7989 Other specified abnormal findings of blood chemistry: Secondary | ICD-10-CM | POA: Diagnosis present

## 2020-05-14 DIAGNOSIS — Z20822 Contact with and (suspected) exposure to covid-19: Secondary | ICD-10-CM | POA: Diagnosis present

## 2020-05-14 DIAGNOSIS — R1110 Vomiting, unspecified: Secondary | ICD-10-CM

## 2020-05-14 DIAGNOSIS — Z7951 Long term (current) use of inhaled steroids: Secondary | ICD-10-CM

## 2020-05-14 DIAGNOSIS — J449 Chronic obstructive pulmonary disease, unspecified: Secondary | ICD-10-CM | POA: Diagnosis present

## 2020-05-14 DIAGNOSIS — T380X5A Adverse effect of glucocorticoids and synthetic analogues, initial encounter: Secondary | ICD-10-CM | POA: Diagnosis present

## 2020-05-14 DIAGNOSIS — Z833 Family history of diabetes mellitus: Secondary | ICD-10-CM

## 2020-05-14 DIAGNOSIS — I5031 Acute diastolic (congestive) heart failure: Secondary | ICD-10-CM | POA: Diagnosis not present

## 2020-05-14 DIAGNOSIS — I5032 Chronic diastolic (congestive) heart failure: Secondary | ICD-10-CM | POA: Diagnosis present

## 2020-05-14 DIAGNOSIS — N400 Enlarged prostate without lower urinary tract symptoms: Secondary | ICD-10-CM | POA: Diagnosis present

## 2020-05-14 LAB — COMPREHENSIVE METABOLIC PANEL
ALT: 35 U/L (ref 0–44)
AST: 17 U/L (ref 15–41)
Albumin: 4.3 g/dL (ref 3.5–5.0)
Alkaline Phosphatase: 125 U/L (ref 38–126)
Anion gap: 8 (ref 5–15)
BUN: 43 mg/dL — ABNORMAL HIGH (ref 8–23)
CO2: 13 mmol/L — ABNORMAL LOW (ref 22–32)
Calcium: 9.5 mg/dL (ref 8.9–10.3)
Chloride: 117 mmol/L — ABNORMAL HIGH (ref 98–111)
Creatinine, Ser: 2.31 mg/dL — ABNORMAL HIGH (ref 0.61–1.24)
GFR, Estimated: 30 mL/min — ABNORMAL LOW (ref >60)
Glucose, Bld: 232 mg/dL — ABNORMAL HIGH (ref 70–99)
Potassium: 6.5 mmol/L (ref 3.5–5.1)
Sodium: 138 mmol/L (ref 135–145)
Total Bilirubin: 0.6 mg/dL (ref 0.3–1.2)
Total Protein: 7.7 g/dL (ref 6.5–8.1)

## 2020-05-14 LAB — CBC WITH DIFFERENTIAL/PLATELET
Abs Immature Granulocytes: 0.08 10*3/uL — ABNORMAL HIGH (ref 0.00–0.07)
Basophils Absolute: 0 10*3/uL (ref 0.0–0.1)
Basophils Relative: 0 %
Eosinophils Absolute: 0.1 10*3/uL (ref 0.0–0.5)
Eosinophils Relative: 1 %
HCT: 31.4 % — ABNORMAL LOW (ref 39.0–52.0)
Hemoglobin: 9.4 g/dL — ABNORMAL LOW (ref 13.0–17.0)
Immature Granulocytes: 1 %
Lymphocytes Relative: 2 %
Lymphs Abs: 0.3 10*3/uL — ABNORMAL LOW (ref 0.7–4.0)
MCH: 30.5 pg (ref 26.0–34.0)
MCHC: 29.9 g/dL — ABNORMAL LOW (ref 30.0–36.0)
MCV: 101.9 fL — ABNORMAL HIGH (ref 80.0–100.0)
Monocytes Absolute: 0.4 10*3/uL (ref 0.1–1.0)
Monocytes Relative: 4 %
Neutro Abs: 10.7 10*3/uL — ABNORMAL HIGH (ref 1.7–7.7)
Neutrophils Relative %: 92 %
Platelets: 206 10*3/uL (ref 150–400)
RBC: 3.08 MIL/uL — ABNORMAL LOW (ref 4.22–5.81)
RDW: 13.5 % (ref 11.5–15.5)
WBC: 11.5 10*3/uL — ABNORMAL HIGH (ref 4.0–10.5)
nRBC: 0 % (ref 0.0–0.2)

## 2020-05-14 LAB — BRAIN NATRIURETIC PEPTIDE: B Natriuretic Peptide: 215 pg/mL — ABNORMAL HIGH (ref 0.0–100.0)

## 2020-05-14 LAB — RESP PANEL BY RT-PCR (FLU A&B, COVID) ARPGX2
Influenza A by PCR: NEGATIVE
Influenza B by PCR: NEGATIVE
SARS Coronavirus 2 by RT PCR: NEGATIVE

## 2020-05-14 LAB — TROPONIN I (HIGH SENSITIVITY)
Troponin I (High Sensitivity): 6 ng/L (ref <18)
Troponin I (High Sensitivity): 5 ng/L (ref <18)

## 2020-05-14 MED ORDER — SULFAMETHOXAZOLE-TRIMETHOPRIM 800-160 MG PO TABS
1.0000 | ORAL_TABLET | ORAL | Status: DC
  Administered 2020-05-15: 1 via ORAL
  Filled 2020-05-14: qty 1

## 2020-05-14 MED ORDER — ENOXAPARIN SODIUM 40 MG/0.4ML ~~LOC~~ SOLN
40.0000 mg | SUBCUTANEOUS | Status: DC
  Administered 2020-05-14 – 2020-05-15 (×2): 40 mg via SUBCUTANEOUS
  Filled 2020-05-14 (×2): qty 0.4

## 2020-05-14 MED ORDER — ATORVASTATIN CALCIUM 20 MG PO TABS
20.0000 mg | ORAL_TABLET | Freq: Every day | ORAL | Status: DC
  Administered 2020-05-14 – 2020-05-15 (×2): 20 mg via ORAL
  Filled 2020-05-14 (×2): qty 1

## 2020-05-14 MED ORDER — CALCIUM GLUCONATE-NACL 1-0.675 GM/50ML-% IV SOLN
1.0000 g | Freq: Once | INTRAVENOUS | Status: AC
  Administered 2020-05-14: 1000 mg via INTRAVENOUS
  Filled 2020-05-14: qty 50

## 2020-05-14 MED ORDER — ONDANSETRON HCL 4 MG/2ML IJ SOLN
4.0000 mg | Freq: Once | INTRAMUSCULAR | Status: AC
  Administered 2020-05-14: 4 mg via INTRAVENOUS
  Filled 2020-05-14: qty 2

## 2020-05-14 MED ORDER — INSULIN ASPART 100 UNIT/ML IV SOLN
10.0000 [IU] | Freq: Once | INTRAVENOUS | Status: AC
  Administered 2020-05-14: 10 [IU] via INTRAVENOUS

## 2020-05-14 MED ORDER — FLUTICASONE FUROATE 27.5 MCG/SPRAY NA SUSP: 2.0000 | Freq: Every day | NASAL | Status: DC

## 2020-05-14 MED ORDER — DEXTROSE 50 % IV SOLN
1.0000 | Freq: Once | INTRAVENOUS | Status: AC
  Administered 2020-05-14: 50 mL via INTRAVENOUS
  Filled 2020-05-14: qty 50

## 2020-05-14 MED ORDER — ASPIRIN EC 81 MG PO TBEC
81.0000 mg | DELAYED_RELEASE_TABLET | Freq: Every day | ORAL | Status: DC
Start: 2020-05-15 — End: 2020-05-16
  Administered 2020-05-15 – 2020-05-16 (×2): 81 mg via ORAL
  Filled 2020-05-14 (×2): qty 1

## 2020-05-14 MED ORDER — MYCOPHENOLATE MOFETIL 250 MG PO CAPS
1000.0000 mg | ORAL_CAPSULE | Freq: Two times a day (BID) | ORAL | Status: DC
  Administered 2020-05-15: 1000 mg via ORAL
  Filled 2020-05-14 (×2): qty 4

## 2020-05-14 MED ORDER — PROCHLORPERAZINE MALEATE 5 MG PO TABS: 10.0000 mg | ORAL_TABLET | Freq: Four times a day (QID) | ORAL | Status: DC | PRN

## 2020-05-14 MED ORDER — SODIUM CHLORIDE 0.9 % IV BOLUS
1000.0000 mL | Freq: Once | INTRAVENOUS | Status: AC
  Administered 2020-05-14: 1000 mL via INTRAVENOUS

## 2020-05-14 MED ORDER — TACROLIMUS 1 MG PO CAPS
8.0000 mg | ORAL_CAPSULE | Freq: Every day | ORAL | Status: DC
  Administered 2020-05-15: 8 mg via ORAL
  Filled 2020-05-14 (×3): qty 8

## 2020-05-14 MED ORDER — BUDESONIDE 0.25 MG/2ML IN SUSP
0.2500 mg | Freq: Two times a day (BID) | RESPIRATORY_TRACT | Status: DC
  Administered 2020-05-15 – 2020-05-16 (×3): 0.25 mg via RESPIRATORY_TRACT
  Filled 2020-05-14 (×4): qty 2

## 2020-05-14 MED ORDER — PROCHLORPERAZINE EDISYLATE 10 MG/2ML IJ SOLN
10.0000 mg | Freq: Four times a day (QID) | INTRAMUSCULAR | Status: DC | PRN
  Administered 2020-05-14: 10 mg via INTRAVENOUS

## 2020-05-14 MED ORDER — SODIUM BICARBONATE 8.4 % IV SOLN
50.0000 meq | Freq: Once | INTRAVENOUS | Status: AC
  Administered 2020-05-14: 50 meq via INTRAVENOUS

## 2020-05-14 MED ORDER — VALGANCICLOVIR HCL 450 MG PO TABS
450.0000 mg | ORAL_TABLET | ORAL | Status: DC
Start: 2020-05-16 — End: 2020-05-16
  Filled 2020-05-14: qty 1

## 2020-05-14 MED ORDER — SODIUM ZIRCONIUM CYCLOSILICATE 10 G PO PACK
10.0000 g | PACK | Freq: Every day | ORAL | Status: DC
  Filled 2020-05-14: qty 1

## 2020-05-14 MED ORDER — PREDNISONE 20 MG PO TABS: 20.0000 mg | ORAL_TABLET | Freq: Every day | ORAL | Status: DC

## 2020-05-14 MED ORDER — PANTOPRAZOLE SODIUM 40 MG IV SOLR
40.0000 mg | INTRAVENOUS | Status: DC
  Administered 2020-05-14 – 2020-05-15 (×2): 40 mg via INTRAVENOUS
  Filled 2020-05-14 (×2): qty 40

## 2020-05-14 MED ORDER — PREDNISONE 20 MG PO TABS: 10.0000 mg | ORAL_TABLET | Freq: Every day | ORAL | Status: DC

## 2020-05-14 MED ORDER — PREDNISONE 20 MG PO TABS
20.0000 mg | ORAL_TABLET | Freq: Every day | ORAL | Status: DC
  Administered 2020-05-15 – 2020-05-16 (×2): 20 mg via ORAL
  Filled 2020-05-14 (×2): qty 1

## 2020-05-14 MED ORDER — TIOTROPIUM BROMIDE MONOHYDRATE 18 MCG IN CAPS: 18.0000 ug | ORAL_CAPSULE | Freq: Every day | RESPIRATORY_TRACT | Status: DC

## 2020-05-14 MED ORDER — UMECLIDINIUM BROMIDE 62.5 MCG/INH IN AEPB
1.0000 | INHALATION_SPRAY | Freq: Every day | RESPIRATORY_TRACT | Status: DC
  Administered 2020-05-15 – 2020-05-16 (×2): 1 via RESPIRATORY_TRACT
  Filled 2020-05-14: qty 7

## 2020-05-14 MED ORDER — BUDESONIDE 180 MCG/ACT IN AEPB: 1.0000 | INHALATION_SPRAY | Freq: Two times a day (BID) | RESPIRATORY_TRACT | Status: DC

## 2020-05-14 MED ORDER — FLUTICASONE PROPIONATE 50 MCG/ACT NA SUSP
2.0000 | Freq: Every day | NASAL | Status: DC
  Administered 2020-05-15 – 2020-05-16 (×2): 2 via NASAL
  Filled 2020-05-14 (×2): qty 16

## 2020-05-14 MED ORDER — GABAPENTIN 100 MG PO CAPS
100.0000 mg | ORAL_CAPSULE | Freq: Two times a day (BID) | ORAL | Status: DC
  Administered 2020-05-15 – 2020-05-16 (×3): 100 mg via ORAL
  Filled 2020-05-14 (×4): qty 1

## 2020-05-14 MED ORDER — TACROLIMUS 1 MG PO CAPS
9.0000 mg | ORAL_CAPSULE | Freq: Every day | ORAL | Status: DC
  Administered 2020-05-15 – 2020-05-16 (×2): 9 mg via ORAL
  Filled 2020-05-14 (×4): qty 9

## 2020-05-14 MED ORDER — MONTELUKAST SODIUM 10 MG PO TABS
10.0000 mg | ORAL_TABLET | Freq: Every day | ORAL | Status: DC
  Administered 2020-05-15 – 2020-05-16 (×2): 10 mg via ORAL
  Filled 2020-05-14 (×2): qty 1

## 2020-05-14 MED ORDER — PREDNISONE 5 MG PO TABS: 15.0000 mg | ORAL_TABLET | Freq: Every day | ORAL | Status: DC

## 2020-05-14 MED ORDER — PREDNISONE 5 MG PO TABS: 5.0000 mg | ORAL_TABLET | Freq: Every day | ORAL | Status: DC

## 2020-05-14 MED ORDER — SODIUM ZIRCONIUM CYCLOSILICATE 5 G PO PACK
5.0000 g | PACK | Freq: Once | ORAL | Status: AC
  Administered 2020-05-14: 5 g via ORAL
  Filled 2020-05-14: qty 1

## 2020-05-14 MED ORDER — TAMSULOSIN HCL 0.4 MG PO CAPS
0.4000 mg | ORAL_CAPSULE | Freq: Every day | ORAL | Status: DC
  Administered 2020-05-15 – 2020-05-16 (×2): 0.4 mg via ORAL
  Filled 2020-05-14 (×2): qty 1

## 2020-05-14 NOTE — ED Notes (Signed)
Pt vomited, Dr. Roderic Palau notified.

## 2020-05-14 NOTE — ED Triage Notes (Addendum)
Pt brought into ED by wife with c/o vomiting and diarrhea that started today while coming home from a follow up appt with his transplant doctor at [facility]. Pt has right kidney transplant 3 weeks ago and was doing well until today. Pt had routine follow up and had labs drawn. Pt left the office and got nauseated and started vomiting and having diarrhea x 3. The office contacted them and told him his Potassium level was 6.3 and he needed to go to ED for fluids due to his abnormal lab value. Pt denies pain but does report generalized weakness.

## 2020-05-14 NOTE — ED Notes (Signed)
Dr. Roderic Palau in to discuss hospital admission with pt and his wife. Pt is willing to be admitted to this facility.

## 2020-05-14 NOTE — H&P (Signed)
History and Physical  Antero Derosia BHA:[id number] DOB: 18-Jul-1955 DOA: 05/14/2020  Referring physician: Milton Ferguson, MD PCP: Alycia Rossetti, MD  Patient coming from: Home  Chief Complaint: Nausea and Vomiting  HPI: Bob Maldonado is a 65 y.o. male with medical history significant for ESRD s/p kidney transplant (about 3 weeks ago), T2DM, hypertension, hyperlipidemia, GERD, COPD and BPH who presents to the emergency department due to nausea and vomiting that started today.  Patient went for a follow-up with his nephrologist at [facility] s/p kidney transplant, blood work was done and his potassium level was at 6.5.  He had a sandwich wrap and a smoothie with wife for lunch after leaving the nephrologist's office.  On his way back to [place], he started vomiting, vomitus contained food and drink that he recently had, no blood in vomitus.  This was associated with nausea.  Patient endorsed 3-4 loose bowel movement ([place] stool chart -type 6) within the last 2 to 3 days.  He denies chest pain, shortness of breath, abdominal pain, fever, chills.  ED Course:  In the emergency department, BP was 172/66, other vital signs were within normal range.  Work-up in the ED showed leukocytosis, macrocytic anemia, BUN/creatinine 43/2.31.  Hyperkalemia, BNP 215, troponin x1 was negative. Acute abdomen with chest x-ray showed no acute abnormality in the chest and abdomen.  Ureteral stent on the right is noted consistent with transplant kidney. IV calcium gluconate 1 g was given to protect the heart.  IV insulin therapy, D50 as well as Lokelma were given due to hyperkalemia.  Bicarb was also given.  IV Zofran due to nausea and vomiting was given. ED physician spoke with nephrologist who states that patient can be admitted here at [facility].  Wife spoke with transplant team at [facility] and they were fine with the patient staying here per ED medical record.  Hospitalist was asked to admit patient for further evaluation and  management.  Review of Systems: Constitutional: Negative for chills and fever.  HENT: Negative for ear pain and sore throat.   Eyes: Negative for pain and visual disturbance.  Respiratory: Negative for cough, chest tightness and shortness of breath.   Cardiovascular: Negative for chest pain and palpitations.  Gastrointestinal: Positive for nausea, vomiting and diarrhea.  Negative for abdominal pain Endocrine: Negative for polyphagia and polyuria.  Genitourinary: Negative for decreased urine volume, dysuria, enuresis Musculoskeletal: Negative for arthralgias and back pain.  Skin: Negative for color change and rash.  Allergic/Immunologic: Negative for immunocompromised state.  Neurological: Positive for weakness.  Negative for tremors, syncope, speech difficulty hematological: Does not bruise/bleed easily.  All other systems reviewed and are negative   Past Medical History:  Diagnosis Date  . Arthritis   . At risk for sleep apnea    STOP-BANG= 5      SENT PCP 03-30-2014  . COPD (chronic obstructive pulmonary disease) ([place])   . ESRD (end stage renal disease) on dialysis ([place])   . ESRD on hemodialysis [facility])    Nephrologist-- Dr Eddie Dibbles--  [facility] in [place]-- M/W/F  . GERD (gastroesophageal reflux disease)   . Gout    per pt stable as of 03-30-2014  . Hemorrhoid   . Hyperlipidemia   . Hypertension   . Prostate cancer ([place]) first dx in 03/16/11, 08/29/13   Gleason 7, volume 55 mL (urologist-  dr Diona Fanti)  treatment External Beam Radiation therapy  . S/P radiation therapy 02/13/2014 through 03/21/2014    Prostate/seminal vesicles 4500 cGy in 25 sessions   .  Sleep apnea    does not use cpap  . Type 2 diabetes mellitus (HCC)    diet controlled - no meds  . Wears dentures   . Wears glasses    Past Surgical History:  Procedure Laterality Date  . AV FISTULA PLACEMENT Left 05/30/2013   Procedure:  Brachial vein transposition 1st stage;  Surgeon: Conrad South Eliot, MD;  Location: [place];  Service: Vascular;  Laterality: Left;  . BASCILIC VEIN TRANSPOSITION Left 07/25/2013   Procedure: SECOND STAGE BRACHIAL VEIN TRANSPOSITION;  Surgeon: Conrad South Duxbury, MD;  Location: [place];  Service: Vascular;  Laterality: Left;  . COLONOSCOPY  06/01/2005  . EYE SURGERY Bilateral    cataracts removed  . INSERTION OF DIALYSIS CATHETER Right 05/30/2013   Procedure: INSERTION OF DIALYSIS CATHETER;  Surgeon: Conrad [place], MD;  Location: [place];  Service: Vascular;  Laterality: Right;  . KNEE ARTHROSCOPY Right 1985  . PROSTATE BIOPSY  last one 08/29/13   Gleason 7, volume 55 mL  . RADIOACTIVE SEED IMPLANT N/A 04/05/2014   Procedure: RADIOACTIVE SEED IMPLANT;  Surgeon: Jorja Loa, MD;  Location: [facility];  Service: Urology;  Laterality: N/A;  60  SEEDS IMPLANTED  NO SEEDS FOUND IN BLADDER  . REVISON OF ARTERIOVENOUS FISTULA Left 06/29/2019   Procedure: REVISON OF LEFT BASILIC VEIN FISTULA;  Surgeon: [name], MD;  Location: [place];  Service: Vascular;  Laterality: Left;    Social History:  reports that he quit smoking about 29 years ago. His smoking use included cigarettes. He has a 20.00 pack-year smoking history. He has never used smokeless tobacco. He reports previous alcohol use. He reports that he does not use drugs.   Allergies  Allergen Reactions  . Metoprolol Cough    Family History  Problem Relation Age of Onset  . Diabetes Mother   . Heart disease Mother   . Heart attack Mother   . Cancer Father   . Heart disease Brother   . Colon cancer Neg Hx     Prior to Admission medications   Medication Sig Start Date End Date Taking? Authorizing Provider  ALBUTEROL IN Inhale into the lungs. Albuterol Inhaler as needed    [provider]  ALLOPURINOL PO Take by mouth at bedtime.     [provider]  amLODipine (NORVASC) 10 MG tablet Take by mouth at bedtime.      [provider]  atorvastatin (LIPITOR) 20 MG tablet Take 1 tablet (20 mg total) by mouth daily. Patient taking differently: Take 20 mg by mouth at bedtime. 08/13/16   Alycia Rossetti, MD  budesonide (PULMICORT) 180 MCG/ACT inhaler Inhale into the lungs 2 (two) times daily.    [provider]  cetirizine (ZYRTEC) 10 MG tablet Take 10 mg by mouth at bedtime.     [provider]  ferric citrate (AURYXIA) 1 GM 210 MG(Fe) tablet Take by mouth. 2 PO TID with meals and 1 PO BID with snacks    [provider]  fluticasone (FLONASE SENSIMIST) 27.5 MCG/SPRAY nasal spray Place 2 sprays into the nose daily.     [provider]  gabapentin (NEURONTIN) 300 MG capsule Take 300 mg by mouth at bedtime.     [provider]  montelukast (SINGULAIR) 10 MG tablet Take 10 mg by mouth at bedtime.    [provider]  Multiple Vitamin (RENAL MULTIVITAMIN/ZINC PO) Take 1 tablet by mouth at bedtime.     [provider]  ofloxacin (OCUFLOX) 0.3 % ophthalmic solution 1 drop 4 (four) times daily.    [provider]  Olopatadine HCl (PATADAY) 0.2 % SOLN 1 drop each eye daily prn 07/29/16   Alycia Rossetti, MD  pantoprazole (PROTONIX) 40 MG tablet Take 40 mg by mouth at bedtime.     [provider]  tamsulosin (FLOMAX) 0.4 MG CAPS capsule Take 1 capsule (0.4 mg total) by mouth 2 (two) times daily. 08/13/16   Alycia Rossetti, MD  tiotropium (SPIRIVA) 18 MCG inhalation capsule Place 18 mcg into inhaler and inhale daily.    [provider]  Vitamin D, Ergocalciferol, (DRISDOL) 50000 units CAPS capsule TAKE 1 CAPSULE BY MOUTH ONCE A MONTH 08/13/16   Alycia Rossetti, MD    Physical Exam: BP (!) 165/75   Pulse 89   Temp 97.9 F (36.6 C) (Oral)   Resp (!) 26   Ht 6' 1.5" (1.867 m)   Wt 98.4 kg   SpO2 100%   BMI 28.24 kg/m   . General: 65 y.o. year-old male well developed well nourished in no acute distress.  Alert and  oriented x3. Marland Kitchen HEENT: Dry mucous membrane.  NCAT, EOMI . Neck: Supple, trachea medial . Cardiovascular: Regular rate and rhythm with no rubs or gallops.  No thyromegaly or JVD noted.  2/4 pulses in all 4 extremities. Marland Kitchen Respiratory: Clear to auscultation with no wheezes or rales. Good inspiratory effort. . Abdomen: Soft nontender nondistended with normal bowel sounds x4 quadrants. . Muskuloskeletal: No cyanosis, clubbing or edema noted bilaterally . Neuro: CN II-XII intact, sensation, reflexes intact, no focal neurologic deficit . Skin: No ulcerative lesions noted or rashes . Psychiatry: Judgement and insight appear normal. Mood is appropriate for condition and setting          Labs on Admission:  Basic Metabolic Panel: Recent Labs  Lab 05/14/20 1757  NA 138  K 6.5*  CL 117*  CO2 13*  GLUCOSE 232*  BUN 43*  CREATININE 2.31*  CALCIUM 9.5   Liver Function Tests: Recent Labs  Lab 05/14/20 1757  AST 17  ALT 35  ALKPHOS 125  BILITOT 0.6  PROT 7.7  ALBUMIN 4.3   No results for input(s): LIPASE, AMYLASE in the last 168 hours. No results for input(s): AMMONIA in the last 168 hours. CBC: Recent Labs  Lab 05/14/20 1757  WBC 11.5*  NEUTROABS 10.7*  HGB 9.4*  HCT 31.4*  MCV 101.9*  PLT 206   Cardiac Enzymes: No results for input(s): CKTOTAL, CKMB, CKMBINDEX, TROPONINI in the last 168 hours.  BNP (last 3 results) Recent Labs    05/14/20 1757  BNP 215.0*    ProBNP (last 3 results) No results for input(s): PROBNP in the last 8760 hours.  CBG: No results for input(s): GLUCAP in the last 168 hours.  Radiological Exams on Admission: DG ABD ACUTE 2+V W 1V CHEST  Result Date: 05/14/2020 CLINICAL DATA:  Nausea and vomiting for 1 day EXAM: DG ABDOMEN ACUTE WITH 1 VIEW CHEST COMPARISON:  12/28/2016 FINDINGS: Cardiac shadow is within normal limits. Tortuous thoracic aorta is noted with mild calcifications. Lungs are well aerated bilaterally. Calcified granulomas are again  seen bilaterally. No focal infiltrate or effusion is seen. Scattered large and small bowel gas is noted. No abnormal mass or abnormal calcifications are seen. No free air is noted. Prostate therapy seeds are noted. A double-J ureteral stent is noted in the pelvis likely related to transplant kidney. No acute bony  abnormality is noted. IMPRESSION: No acute abnormality in the chest and abdomen. Ureteral stent on the right is noted consistent with transplant kidney. Electronically Signed   By: Inez Catalina M.D.   On: 05/14/2020 19:03    EKG: I independently viewed the EKG done and my findings are as followed: Normal sinus rhythm at a rate of 74 bpm with nonspecific T wave abnormalities   Assessment/Plan Present on Admission: . Hyperkalemia . Hyperlipidemia  Active Problems:   Essential hypertension   Hyperlipidemia   Hyperkalemia   Nausea & vomiting   Diarrhea   S/P kidney transplant   Hyperglycemia due to diabetes mellitus (HCC)   Leukocytosis   Macrocytic anemia   GERD (gastroesophageal reflux disease)   Hyperkalemia K+ 6.5; no EKG changes noted IV calcium gluconate was given IV insulin and D50 and IV bicarbonate were given Lokelma was given; continue Lokelma for hyperkalemia Nephrology was consulted and will see patient in the morning per ED physician  Nausea, vomiting, diarrhea Continue IV Zofran/Compazine as needed Patient has not had any diarrhea since arrival to the ED Consider GI panel for ongoing diarrhea  S/p kidney transplant Continue Prograf, Deltasone, CellCept Nephrology consult in the morning per ED physician  Elevated BNP r/o CHF BNP 215, this may be related to patient's kidney status Chest x-ray showed no acute abnormality Continue total input/output, daily weights and fluid restriction Continue Cardiac diet  EKG shows normal sinus rhythm at a rate of 74 bpm with nonspecific T wave elevation Echocardiogram will be done in the morning   Hyperglycemia  secondary to T2DM Continue ISS and hypoglycemic protocol  Peripheral neuropathy Continue gabapentin  Leukocytosis possibly reactive WBC 11.5, patient was on prednisone Continue to monitor WBC with morning labs  Macrocytic anemia MCV 101.9; folate and vitamin B12 level will be checked  Essential hypertension (uncontrolled) Continue amlodipine  Hyperlipidemia Continue Lipitor  GERD Continue Protonix  COPD (not on home oxygen) Continue Pulmicort, Singulair, Spiriva  BPH Continue Flomax  Other home meds: Aspirin, Flonase  DVT prophylaxis: Lovenox  Code Status: Full code  Family Communication: Wife at bedside (all questions answered to satisfaction)  Disposition Plan:  Patient is from:                        home Anticipated DC to:                   SNF or family members home Anticipated DC date:               2-3 days Anticipated DC barriers:          Patient requires inpatient management due to hyperkalemia, nausea and vomiting with difficulty to tolerate oral intake   Consults called: Nephrology  Admission status: Inpatient   Bernadette Hoit MD [facility]  05/14/2020, 9:25 PM

## 2020-05-14 NOTE — ED Notes (Signed)
Amber updated on report.

## 2020-05-14 NOTE — ED Notes (Signed)
Pt experiencing nausea and this nurse will admin Zokelma when nausea is relieved.

## 2020-05-14 NOTE — ED Notes (Signed)
Pt vomited again. Dr. Josephine Cables notified.

## 2020-05-14 NOTE — ED Provider Notes (Signed)
[facility] EMERGENCY DEPARTMENT Provider Note   CSN: [id number] Arrival date & time: 05/14/20  1734     History Chief Complaint  Patient presents with  . Vomiting    Abnormal Labs     Bob Maldonado is a 65 y.o. male.  Patient had a kidney transplant 3 weeks ago.  He was seen by his nephrologist today and they checked blood work on him and his potassium was 6.5.  Since he left the office he has been vomiting.  He had diarrhea for couple days  The history is provided by the patient and medical records. No language interpreter was used.  Emesis Severity:  Moderate Timing:  Constant Quality:  Bilious material Able to tolerate:  Liquids Progression:  Worsening Chronicity:  New Recent urination:  Normal Associated symptoms: no abdominal pain, no cough, no diarrhea and no headaches        Past Medical History:  Diagnosis Date  . Arthritis   . At risk for sleep apnea    STOP-BANG= 5      SENT PCP 03-30-2014  . COPD (chronic obstructive pulmonary disease) ([place])   . ESRD (end stage renal disease) on dialysis ([place])   . ESRD on hemodialysis [facility])    Nephrologist-- Dr Eddie Dibbles--  [facility] in [place]-- M/W/F  . GERD (gastroesophageal reflux disease)   . Gout    per pt stable as of 03-30-2014  . Hemorrhoid   . Hyperlipidemia   . Hypertension   . Prostate cancer ([place]) first dx in 03/16/11, 08/29/13   Gleason 7, volume 55 mL (urologist-  dr Diona Fanti)  treatment External Beam Radiation therapy  . S/P radiation therapy 02/13/2014 through 03/21/2014    Prostate/seminal vesicles 4500 cGy in 25 sessions   . Sleep apnea    does not use cpap  . Type 2 diabetes mellitus (HCC)    diet controlled - no meds  . Wears dentures   . Wears glasses     Patient Active Problem List   Diagnosis Date Noted  . Vomiting and diarrhea 05/14/2020  . Hemorrhoids 07/12/2014  . Constipation 07/12/2014  . Rotator cuff  syndrome of both shoulders 12/25/2013  . ESRD on dialysis ([place]) 06/14/2013  . Peripheral edema 05/26/2013  . Hyperkalemia 05/26/2013  . Anemia of chronic renal failure 05/26/2013  . Hyperlipidemia 07/14/2012  . Gout 07/14/2012  . Diabetes mellitus with complication ([place]) 12/87/8676  . Essential hypertension, benign 12/15/2011  . Obesity 12/15/2011  . Prostate cancer ([place]) 12/15/2011    Past Surgical History:  Procedure Laterality Date  . AV FISTULA PLACEMENT Left 05/30/2013   Procedure: Brachial vein transposition 1st stage;  Surgeon: Conrad Adair, MD;  Location: [place];  Service: Vascular;  Laterality: Left;  . BASCILIC VEIN TRANSPOSITION Left 07/25/2013   Procedure: SECOND STAGE BRACHIAL VEIN TRANSPOSITION;  Surgeon: Conrad Oak Hill, MD;  Location: [place];  Service: Vascular;  Laterality: Left;  . COLONOSCOPY  06/01/2005  . EYE SURGERY Bilateral    cataracts removed  . INSERTION OF DIALYSIS CATHETER Right 05/30/2013   Procedure: INSERTION OF DIALYSIS CATHETER;  Surgeon: Conrad Oneida, MD;  Location: [place];  Service: Vascular;  Laterality: Right;  . KNEE ARTHROSCOPY Right 1985  . PROSTATE BIOPSY  last one 08/29/13   Gleason 7, volume 55 mL  . RADIOACTIVE SEED IMPLANT N/A 04/05/2014   Procedure: RADIOACTIVE SEED IMPLANT;  Surgeon: Jorja Loa, MD;  Location: [facility];  Service: Urology;  Laterality: N/A;  44  SEEDS IMPLANTED  NO SEEDS FOUND IN BLADDER  . REVISON OF ARTERIOVENOUS FISTULA Left 06/29/2019   Procedure: REVISON OF LEFT BASILIC VEIN FISTULA;  Surgeon: [name], MD;  Location: [facility] OR;  Service: Vascular;  Laterality: Left;       Family History  Problem Relation Age of Onset  . Diabetes Mother   . Heart disease Mother   . Heart attack Mother   . Cancer Father   . Heart disease Brother   . Colon cancer Neg Hx     Social History   Tobacco Use  . Smoking status: Former Smoker    Packs/day: 1.00    Years: 20.00    Pack years: 20.00    Types:  Cigarettes    Quit date: 09/27/1990    Years since quitting: 29.6  . Smokeless tobacco: Never Used  Vaping Use  . Vaping Use: Never used  Substance Use Topics  . Alcohol use: Not Currently    Comment: Rarely  . Drug use: No    Home Medications Prior to Admission medications   Medication Sig Start Date End Date Taking? Authorizing Provider  ALBUTEROL IN Inhale into the lungs. Albuterol Inhaler as needed    [provider]  ALLOPURINOL PO Take by mouth at bedtime.     [provider]  amLODipine (NORVASC) 10 MG tablet Take by mouth at bedtime.     [provider]  atorvastatin (LIPITOR) 20 MG tablet Take 1 tablet (20 mg total) by mouth daily. Patient taking differently: Take 20 mg by mouth at bedtime. 08/13/16   Alycia Rossetti, MD  budesonide (PULMICORT) 180 MCG/ACT inhaler Inhale into the lungs 2 (two) times daily.    [provider]  cetirizine (ZYRTEC) 10 MG tablet Take 10 mg by mouth at bedtime.     [provider]  ferric citrate (AURYXIA) 1 GM 210 MG(Fe) tablet Take by mouth. 2 PO TID with meals and 1 PO BID with snacks    [provider]  fluticasone (FLONASE SENSIMIST) 27.5 MCG/SPRAY nasal spray Place 2 sprays into the nose daily.     [provider]  gabapentin (NEURONTIN) 300 MG capsule Take 300 mg by mouth at bedtime.     [provider]  montelukast (SINGULAIR) 10 MG tablet Take 10 mg by mouth at bedtime.    [provider]  Multiple Vitamin (RENAL MULTIVITAMIN/ZINC PO) Take 1 tablet by mouth at bedtime.     [provider]  ofloxacin (OCUFLOX) 0.3 % ophthalmic solution 1 drop 4 (four) times daily.    [provider]  Olopatadine HCl (PATADAY) 0.2 % SOLN 1 drop each eye daily prn 07/29/16   Alycia Rossetti, MD  pantoprazole (PROTONIX) 40 MG tablet Take 40 mg by mouth at bedtime.     [provider]  tamsulosin (FLOMAX) 0.4 MG CAPS capsule Take 1 capsule (0.4 mg total)  by mouth 2 (two) times daily. 08/13/16   Alycia Rossetti, MD  tiotropium (SPIRIVA) 18 MCG inhalation capsule Place 18 mcg into inhaler and inhale daily.    [provider]  Vitamin D, Ergocalciferol, (DRISDOL) 50000 units CAPS capsule TAKE 1 CAPSULE BY MOUTH ONCE A MONTH 08/13/16   Alycia Rossetti, MD    Allergies    Metoprolol  Review of Systems   Review of Systems  Constitutional: Negative for appetite change and fatigue.  HENT: Negative for congestion, ear discharge and sinus pressure.   Eyes: Negative for  discharge.  Respiratory: Negative for cough.   Cardiovascular: Negative for chest pain.  Gastrointestinal: Positive for vomiting. Negative for abdominal pain and diarrhea.       Diarrhea  Genitourinary: Negative for frequency and hematuria.  Musculoskeletal: Negative for back pain.  Skin: Negative for rash.  Neurological: Negative for seizures and headaches.  Psychiatric/Behavioral: Negative for hallucinations.    Physical Exam Updated Vital Signs BP (!) 165/75   Pulse 89   Temp 97.9 F (36.6 C) (Oral)   Resp (!) 26   Ht 6' 1.5" (1.867 m)   Wt 98.4 kg   SpO2 100%   BMI 28.24 kg/m   Physical Exam Vitals and nursing note reviewed.  Constitutional:      Appearance: He is well-developed.  HENT:     Head: Normocephalic.     Nose: Nose normal.  Eyes:     General: No scleral icterus.    Conjunctiva/sclera: Conjunctivae normal.  Neck:     Thyroid: No thyromegaly.  Cardiovascular:     Rate and Rhythm: Normal rate and regular rhythm.     Heart sounds: No murmur heard. No friction rub. No gallop.   Pulmonary:     Breath sounds: No stridor. No wheezing or rales.  Chest:     Chest wall: No tenderness.  Abdominal:     General: There is no distension.     Tenderness: There is no abdominal tenderness. There is no rebound.  Musculoskeletal:        General: Normal range of motion.     Cervical back: Neck supple.  Lymphadenopathy:     Cervical: No cervical  adenopathy.  Skin:    Findings: No erythema or rash.  Neurological:     Mental Status: He is alert and oriented to person, place, and time.     Motor: No abnormal muscle tone.     Coordination: Coordination normal.  Psychiatric:        Behavior: Behavior normal.     ED Results / Procedures / Treatments   Labs (all labs ordered are listed, but only abnormal results are displayed) Labs Reviewed  CBC WITH DIFFERENTIAL/PLATELET - Abnormal; Notable for the following components:      Result Value   WBC 11.5 (*)    RBC 3.08 (*)    Hemoglobin 9.4 (*)    HCT 31.4 (*)    MCV 101.9 (*)    MCHC 29.9 (*)    Neutro Abs 10.7 (*)    Lymphs Abs 0.3 (*)    Abs Immature Granulocytes 0.08 (*)    All other components within normal limits  COMPREHENSIVE METABOLIC PANEL - Abnormal; Notable for the following components:   Potassium 6.5 (*)    Chloride 117 (*)    CO2 13 (*)    Glucose, Bld 232 (*)    BUN 43 (*)    Creatinine, Ser 2.31 (*)    GFR, Estimated 30 (*)    All other components within normal limits  BRAIN NATRIURETIC PEPTIDE - Abnormal; Notable for the following components:   B Natriuretic Peptide 215.0 (*)    All other components within normal limits  TROPONIN I (HIGH SENSITIVITY)  TROPONIN I (HIGH SENSITIVITY)    EKG None  Radiology DG ABD ACUTE 2+V W 1V CHEST  Result Date: 05/14/2020 CLINICAL DATA:  Nausea and vomiting for 1 day EXAM: DG ABDOMEN ACUTE WITH 1 VIEW CHEST COMPARISON:  12/28/2016 FINDINGS: Cardiac shadow is within normal limits. Tortuous thoracic aorta is noted with mild  calcifications. Lungs are well aerated bilaterally. Calcified granulomas are again seen bilaterally. No focal infiltrate or effusion is seen. Scattered large and small bowel gas is noted. No abnormal mass or abnormal calcifications are seen. No free air is noted. Prostate therapy seeds are noted. A double-J ureteral stent is noted in the pelvis likely related to transplant kidney. No acute bony  abnormality is noted. IMPRESSION: No acute abnormality in the chest and abdomen. Ureteral stent on the right is noted consistent with transplant kidney. Electronically Signed   By: Inez Catalina M.D.   On: 05/14/2020 19:03    Procedures Procedures   Medications Ordered in ED Medications  sodium zirconium cyclosilicate (LOKELMA) packet 5 g (has no administration in time range)  calcium gluconate 1 g/ 50 mL sodium chloride IVPB (has no administration in time range)  sodium chloride 0.9 % bolus 1,000 mL (0 mLs Intravenous Stopped 05/14/20 1847)  ondansetron (ZOFRAN) injection 4 mg (4 mg Intravenous Given 05/14/20 1820)  insulin aspart (novoLOG) injection 10 Units (10 Units Intravenous Given 05/14/20 1940)    And  dextrose 50 % solution 50 mL (50 mLs Intravenous Given 05/14/20 1939)  sodium bicarbonate injection 50 mEq (50 mEq Intravenous Given 05/14/20 1939)  ondansetron (ZOFRAN) injection 4 mg (4 mg Intravenous Given 05/14/20 1943)    ED Course  I have reviewed the triage vital signs and the nursing notes.  Pertinent labs & imaging results that were available during my care of the patient were reviewed by me and considered in my medical decision making (see chart for details). CRITICAL CARE Performed by: Milton Ferguson Total critical care time: 45 minutes Critical care time was exclusive of separately billable procedures and treating other patients. Critical care was necessary to treat or prevent imminent or life-threatening deterioration. Critical care was time spent personally by me on the following activities: development of treatment plan with patient and/or surrogate as well as nursing, discussions with consultants, evaluation of patient's response to treatment, examination of patient, obtaining history from patient or surrogate, ordering and performing treatments and interventions, ordering and review of laboratory studies, ordering and review of radiographic studies, pulse oximetry and  re-evaluation of patient's condition.    MDM Rules/Calculators/A&P                          Patient with hyperkalemia and he has been treated in the emergency department with bicarb, insulin glucose and calcium,   I spoke with nephrology and they stated the patient can be admitted here and they will consult on the patient tomorrow the patient's wife spoke to transplant team at [facility] and they were fine with the patient staying here.  He will be admitted for vomiting and hyperkalemia Final Clinical Impression(s) / ED Diagnoses Final diagnoses:  None    Rx / DC Orders ED Discharge Orders    None       Milton Ferguson, MD 05/14/20 2022

## 2020-05-15 ENCOUNTER — Inpatient Hospital Stay (HOSPITAL_COMMUNITY): Payer: No Typology Code available for payment source

## 2020-05-15 DIAGNOSIS — I5031 Acute diastolic (congestive) heart failure: Secondary | ICD-10-CM

## 2020-05-15 LAB — PROTIME-INR
INR: 1.2 (ref 0.8–1.2)
Prothrombin Time: 14.2 seconds (ref 11.4–15.2)

## 2020-05-15 LAB — CBC
HCT: 28.4 % — ABNORMAL LOW (ref 39.0–52.0)
Hemoglobin: 8.6 g/dL — ABNORMAL LOW (ref 13.0–17.0)
MCH: 29.5 pg (ref 26.0–34.0)
MCHC: 30.3 g/dL (ref 30.0–36.0)
MCV: 97.3 fL (ref 80.0–100.0)
Platelets: 196 10*3/uL (ref 150–400)
RBC: 2.92 MIL/uL — ABNORMAL LOW (ref 4.22–5.81)
RDW: 13.2 % (ref 11.5–15.5)
WBC: 12.2 10*3/uL — ABNORMAL HIGH (ref 4.0–10.5)
nRBC: 0 % (ref 0.0–0.2)

## 2020-05-15 LAB — PHOSPHORUS: Phosphorus: 2.7 mg/dL (ref 2.5–4.6)

## 2020-05-15 LAB — COMPREHENSIVE METABOLIC PANEL
ALT: 28 U/L (ref 0–44)
AST: 12 U/L — ABNORMAL LOW (ref 15–41)
Albumin: 3.8 g/dL (ref 3.5–5.0)
Alkaline Phosphatase: 126 U/L (ref 38–126)
Anion gap: 8 (ref 5–15)
BUN: 39 mg/dL — ABNORMAL HIGH (ref 8–23)
CO2: 15 mmol/L — ABNORMAL LOW (ref 22–32)
Calcium: 9.6 mg/dL (ref 8.9–10.3)
Chloride: 117 mmol/L — ABNORMAL HIGH (ref 98–111)
Creatinine, Ser: 2.04 mg/dL — ABNORMAL HIGH (ref 0.61–1.24)
GFR, Estimated: 35 mL/min — ABNORMAL LOW (ref >60)
Glucose, Bld: 232 mg/dL — ABNORMAL HIGH (ref 70–99)
Potassium: 6.5 mmol/L (ref 3.5–5.1)
Sodium: 140 mmol/L (ref 135–145)
Total Bilirubin: 0.6 mg/dL (ref 0.3–1.2)
Total Protein: 7 g/dL (ref 6.5–8.1)

## 2020-05-15 LAB — ECHOCARDIOGRAM COMPLETE
AR max vel: 1.48 cm2
AV Area VTI: 1.57 cm2
AV Area mean vel: 1.44 cm2
AV Mean grad: 15.3 mmHg
AV Peak grad: 32.2 mmHg
Ao pk vel: 2.84 m/s
Area-P 1/2: 3.97 cm2
Height: 74 in
S' Lateral: 2.66 cm
Weight: 3559.11 oz

## 2020-05-15 LAB — URINALYSIS, COMPLETE (UACMP) WITH MICROSCOPIC
Bilirubin Urine: NEGATIVE
Glucose, UA: >=500 mg/dL — AB
Ketones, ur: NEGATIVE mg/dL
Nitrite: NEGATIVE
Protein, ur: NEGATIVE mg/dL
RBC / HPF: >50 RBC/hpf — ABNORMAL HIGH (ref 0–5)
Specific Gravity, Urine: 1.012 (ref 1.005–1.030)
pH: 5 (ref 5.0–8.0)

## 2020-05-15 LAB — GLUCOSE, CAPILLARY
Glucose-Capillary: 279 mg/dL — ABNORMAL HIGH (ref 70–99)
Glucose-Capillary: 265 mg/dL — ABNORMAL HIGH (ref 70–99)

## 2020-05-15 LAB — MAGNESIUM: Magnesium: 1.5 mg/dL — ABNORMAL LOW (ref 1.7–2.4)

## 2020-05-15 LAB — HEMOGLOBIN A1C
Hgb A1c MFr Bld: 6.9 % — ABNORMAL HIGH (ref 4.8–5.6)
Mean Plasma Glucose: 151.33 mg/dL

## 2020-05-15 LAB — FOLATE: Folate: 9.3 ng/mL (ref >5.9)

## 2020-05-15 LAB — APTT: aPTT: 29 seconds (ref 24–36)

## 2020-05-15 LAB — NA AND K (SODIUM & POTASSIUM), RAND UR
Potassium Urine: 18 mmol/L
Sodium, Ur: 72 mmol/L

## 2020-05-15 LAB — HIV ANTIBODY (ROUTINE TESTING W REFLEX): HIV Screen 4th Generation wRfx: NONREACTIVE

## 2020-05-15 LAB — VITAMIN B12: Vitamin B-12: 638 pg/mL (ref 180–914)

## 2020-05-15 LAB — CREATININE, URINE, RANDOM: Creatinine, Urine: 77.91 mg/dL

## 2020-05-15 MED ORDER — HYDRALAZINE HCL 20 MG/ML IJ SOLN
10.0000 mg | INTRAMUSCULAR | Status: DC | PRN
  Administered 2020-05-15: 10 mg via INTRAVENOUS
  Filled 2020-05-15: qty 1

## 2020-05-15 MED ORDER — BOOST / RESOURCE BREEZE PO LIQD CUSTOM
1.0000 | Freq: Two times a day (BID) | ORAL | Status: DC
  Administered 2020-05-15 – 2020-05-16 (×2): 1 via ORAL

## 2020-05-15 MED ORDER — MYCOPHENOLATE SODIUM 180 MG PO TBEC
720.0000 mg | DELAYED_RELEASE_TABLET | Freq: Two times a day (BID) | ORAL | Status: DC
  Administered 2020-05-15 – 2020-05-16 (×3): 720 mg via ORAL
  Filled 2020-05-15 (×6): qty 4

## 2020-05-15 MED ORDER — CHLORHEXIDINE GLUCONATE CLOTH 2 % EX PADS
6.0000 | MEDICATED_PAD | Freq: Every day | CUTANEOUS | Status: DC
  Administered 2020-05-15 – 2020-05-16 (×2): 6 via TOPICAL

## 2020-05-15 MED ORDER — STERILE WATER FOR INJECTION IV SOLN
INTRAVENOUS | Status: AC
  Filled 2020-05-15: qty 850

## 2020-05-15 MED ORDER — AMLODIPINE BESYLATE 5 MG PO TABS
5.0000 mg | ORAL_TABLET | Freq: Every day | ORAL | Status: DC
  Administered 2020-05-16: 5 mg via ORAL
  Filled 2020-05-15 (×2): qty 1

## 2020-05-15 MED ORDER — INSULIN GLARGINE 100 UNIT/ML ~~LOC~~ SOLN
14.0000 [IU] | Freq: Every day | SUBCUTANEOUS | Status: DC
  Filled 2020-05-15: qty 0.14

## 2020-05-15 MED ORDER — INSULIN ASPART 100 UNIT/ML ~~LOC~~ SOLN
5.0000 [IU] | Freq: Three times a day (TID) | SUBCUTANEOUS | Status: DC
  Administered 2020-05-15: 5 [IU] via SUBCUTANEOUS

## 2020-05-15 MED ORDER — SODIUM ZIRCONIUM CYCLOSILICATE 10 G PO PACK
10.0000 g | PACK | Freq: Three times a day (TID) | ORAL | Status: AC
  Administered 2020-05-15 (×3): 10 g via ORAL
  Filled 2020-05-15 (×2): qty 1

## 2020-05-15 MED ORDER — INSULIN ASPART 100 UNIT/ML ~~LOC~~ SOLN
0.0000 [IU] | Freq: Three times a day (TID) | SUBCUTANEOUS | Status: DC
  Administered 2020-05-15: 8 [IU] via SUBCUTANEOUS
  Administered 2020-05-16: 3 [IU] via SUBCUTANEOUS

## 2020-05-15 MED ORDER — INSULIN ASPART 100 UNIT/ML ~~LOC~~ SOLN
0.0000 [IU] | Freq: Every day | SUBCUTANEOUS | Status: DC
  Administered 2020-05-15: 2 [IU] via SUBCUTANEOUS

## 2020-05-15 MED ORDER — INSULIN GLARGINE 100 UNIT/ML ~~LOC~~ SOLN
16.0000 [IU] | Freq: Every day | SUBCUTANEOUS | Status: DC
  Administered 2020-05-15: 16 [IU] via SUBCUTANEOUS
  Filled 2020-05-15 (×2): qty 0.16

## 2020-05-15 NOTE — TOC Progression Note (Signed)
Transition of Care [facility]) - Progression Note    Patient Details  Name: Bob Maldonado MRN: [id number] Date of Birth: 1955-05-09  Transition of Care [facility]) CM/SW Contact  Salome Arnt, [place] Phone Number: 05/15/2020, 7:59 AM  Clinical Narrative:   [place] notification completed. Notification ID: [id number].          Expected Discharge Plan and Services                                                 Social Determinants of Health (SDOH) Interventions    Readmission Risk Interventions No flowsheet data found.

## 2020-05-15 NOTE — Progress Notes (Signed)
Patients wife in room gave this writer number to the transplant center at [facility] , Patients wife would like them to be updated by Dr. Wynetta Emery, Paged to notify . She is asking to have him transferred also made physician aware, Wife at bedside.

## 2020-05-15 NOTE — Progress Notes (Signed)
Patients BP is 192/70 at this time , refused Norvasc. Educated patient on the importance of taking blood pressure medication patient states " I can't take it " . Notified Dr. Wynetta Emery

## 2020-05-15 NOTE — Progress Notes (Signed)
Initial Nutrition Assessment  DOCUMENTATION CODES:      INTERVENTION:  Recommend sodium < 2.3 gr per day  Adjust intake of high potassium foods to maintain normal range  Maintain dietary intake of phosphorus to remain within desired limits  NUTRITION DIAGNOSIS:  Inadequate oral intake related to vomiting,acute illness as evidenced by per patient/family report (clear liquids).   GOAL:  Patient will meet greater than or equal to 90% of their needs  MONITOR:  PO intake,Diet advancement,Labs,Skin,Weight trends  REASON FOR ASSESSMENT:   Malnutrition Screening Tool    ASSESSMENT: Patient is a 65 yo male with history of COPD, GERD, DM2, ESRD s/p transplant at [facility] ~3 weeks ago. Presented with vomiting and hyperkalemia.  Tolerating clears per patient - no vomiting today. Home diet regular prior to admission. Fluid intake 64 oz daily. Eating at usual up acute onset of vomiting.    Usual weight 106.5 kg per patient. Currently 100.9 kg.   Labs reviewed: K+ 6.5 (H), Glucose 232 (H), BUN 39 (H), Cr 2.04 (H).   Medications reviewed and include: lipitor, protonix, prograf.   NUTRITION - FOCUSED PHYSICAL EXAM:  Flowsheet Row Most Recent Value  Orbital Region No depletion  Upper Arm Region No depletion  Thoracic and Lumbar Region No depletion  Buccal Region No depletion  Temple Region Mild depletion  Clavicle Bone Region No depletion  Clavicle and Acromion Bone Region No depletion  Scapular Bone Region No depletion  Dorsal Hand No depletion  Patellar Region No depletion  Anterior Thigh Region No depletion  Posterior Calf Region No depletion  Edema (RD Assessment) None  Hair Reviewed  Eyes Reviewed  Mouth Reviewed  Skin Reviewed  Nails Reviewed      Diet Order:   Diet Order            Diet clear liquid Room service appropriate? Yes; Fluid consistency: Thin  Diet effective now                 EDUCATION NEEDS:  Education needs have been addressed  Skin:  Skin  Assessment: Skin Integrity Issues: Skin Integrity Issues:: Incisions Incisions: dry closed - from surgery 3 weeks ago  Last BM:  3/29  Height:   Ht Readings from Last 1 Encounters:  05/14/20 6\' 2"  (1.88 m)    Weight:   Wt Readings from Last 1 Encounters:  05/14/20 100.9 kg    Ideal Body Weight:   86 kg  BMI:  Body mass index is 28.56 kg/m.  Estimated Nutritional Needs:   Kcal:  6168-3729  Protein:  86-95 gr  Fluid:  2 liters daily   [place] Cater MS,RD,CSG,LDN Pager: Shea Evans

## 2020-05-15 NOTE — TOC Initial Note (Addendum)
Transition of Care [facility]) - Initial/Assessment Note    Patient Details  Name: Bob Maldonado MRN: [id number] Date of Birth: 19-Sep-1955  Transition of Care [facility]) CM/SW Contact:    Salome Arnt, LCSW Phone Number: 05/15/2020, 8:08 AM  Clinical Narrative:  Pt admitted for hyperkalemia. LCSW completed assessment due to high risk readmission score. Pt lives at home with his wife and adult daughter. He is typically fairly independent with ADLs. Pt s/p kidney transplant several weeks ago at [facility]. His wife and daughter have been primary caregivers, but pt indicates he has been managing well. He is active with [employer] with [facility] notified of admission. Pt is followed by [facility]. He plans to return home when medically stable. TOC will continue to follow.                 Expected Discharge Plan: [place] Barriers to Discharge: Continued Medical Work up   Patient Goals and CMS Choice Patient states their goals for this hospitalization and ongoing recovery are:: return home   Choice offered to / list presented to : Patient  Expected Discharge Plan and Services Expected Discharge Plan: [place] In-house Referral: Clinical Social Work   Post Acute Care Choice: Resumption of Svcs/PTA Provider Living arrangements for the past 2 months: Single Family Home                 DME Arranged: N/A DME Agency: NA       HH Arranged: PT [place]  Date HH Agency Contacted: 05/15/20 Time [place]: [id number] Representative spoke with at home health agency: Santiago Glad  Prior Living Arrangements/Services Living arrangements for the past 2 months: Single Family Home Lives with:: Adult Children,Spouse Patient language and need for interpreter reviewed:: Yes Do you feel safe going back to the place where you live?: Yes      Need for Family Participation in Patient Care: Yes (Comment) Care giver support system in  place?: Yes (comment) Current home services: DME,Home PT (has rollator) Criminal Activity/Legal Involvement Pertinent to Current Situation/Hospitalization: No - Comment as needed  Activities of Daily Living Home Assistive Devices/Equipment: None,Dentures (specify type),Walker (specify type) ADL Screening (condition at time of admission) Patient's cognitive ability adequate to safely complete daily activities?: Yes Is the patient deaf or have difficulty hearing?: No Does the patient have difficulty seeing, even when wearing glasses/contacts?: No Does the patient have difficulty concentrating, remembering, or making decisions?: No Patient able to express need for assistance with ADLs?: Yes Does the patient have difficulty dressing or bathing?: No Independently performs ADLs?: Yes (appropriate for developmental age) Does the patient have difficulty walking or climbing stairs?: No Weakness of Legs: None Weakness of Arms/Hands: None  Permission Sought/Granted         Permission granted to share info with agency: [facility]  Permission granted to share info w Relationship: Home health     Emotional Assessment   Attitude/Demeanor/Rapport: Engaged Affect (typically observed): Accepting Orientation: : Oriented to Place,Oriented to  Time,Oriented to Situation,Oriented to Self Alcohol / Substance Use: Not Applicable Psych Involvement: No (comment)  Admission diagnosis:  Vomiting and diarrhea [R11.10, R19.7] Patient Active Problem List   Diagnosis Date Noted  . Nausea & vomiting 05/14/2020  . Diarrhea 05/14/2020  . S/P kidney transplant 05/14/2020  . Hyperglycemia due to diabetes mellitus ([place]) 05/14/2020  . Leukocytosis 05/14/2020  . Macrocytic anemia 05/14/2020  . GERD (gastroesophageal reflux disease) 05/14/2020  .  COPD (chronic obstructive pulmonary disease) ([place]) 05/14/2020  . BPH (benign prostatic hyperplasia) 05/14/2020  . Hemorrhoids 07/12/2014  . Constipation 07/12/2014  .  Rotator cuff syndrome of both shoulders 12/25/2013  . ESRD on dialysis ([place]) 06/14/2013  . Peripheral edema 05/26/2013  . Hyperkalemia 05/26/2013  . Anemia of chronic renal failure 05/26/2013  . Hyperlipidemia 07/14/2012  . Gout 07/14/2012  . Diabetes mellitus with complication ([place]) 17/53/0104  . Essential hypertension 12/15/2011  . Obesity 12/15/2011  . Prostate cancer ([place]) 12/15/2011   PCP:  Alycia Rossetti, MD Pharmacy:   [place], [place] [address] AT [place] [place] [address] Phone: [phone] Fax: [place], [place] - [place] [address] [place] [address] Phone: [phone] Fax: [phone]     Social Determinants of Health (SDOH) Interventions    Readmission Risk Interventions Readmission Risk Prevention Plan 05/15/2020  Transportation Screening Complete  Medication Review ([place]) Complete  HRI or Home Care Consult Complete  SW Recovery Care/Counseling Consult Complete  Palliative Care Screening Not [place] Not Applicable  Some recent data might be hidden

## 2020-05-15 NOTE — Progress Notes (Addendum)
PROGRESS NOTE   Bob Maldonado  EPP:[id number] DOB: 30-Jul-1955 DOA: 05/14/2020 PCP: Alycia Rossetti, MD   Chief Complaint  Patient presents with   Vomiting    Abnormal Labs    Level of care: Telemetry  Brief Admission History:   65 y.o. male with medical history significant for ESRD s/p kidney transplant (about 3 weeks ago), T2DM, hypertension, hyperlipidemia, GERD, COPD and BPH who presents to the emergency department due to nausea and vomiting and admitted with hyperkalemia.    Assessment & Plan:   Active Problems:   Essential hypertension   Hyperlipidemia   Hyperkalemia   Nausea & vomiting   Diarrhea   S/P kidney transplant   Hyperglycemia due to diabetes mellitus (HCC)   Leukocytosis   Macrocytic anemia   GERD (gastroesophageal reflux disease)   COPD (chronic obstructive pulmonary disease) (HCC)   BPH (benign prostatic hyperplasia)   Hyperkalemia - remains unchanged at 6.5, no peaked t waves seen, continue Lokelma 10 g TID x 3 doses, recheck in AM, further recs per nephrology service.  Postop s/p renal transplant - appreciate renal consultation for med management. Type 2 diabetes mellitus - continue SSI coverage with CBG testing.  Leukocytosis - likely steroid induced, following.  Essential hypertension - started on amlodipine by nephrology service.  Peripheral neuropathy - on gabapentin.  Hyperlipidemia - on atorvastatin.  COPD - stable on home bronchodilators.  BPH - remains well on flomax.  Elevated BNP  - 2D echocardiogram pending.  Hypomagnesemia - IV replacement.   DVT prophylaxis: enoxaparin Code Status: full  Family Communication:  Disposition: home  Status is: Inpatient  Remains inpatient appropriate because:IV treatments appropriate due to intensity of illness or inability to take PO and Inpatient level of care appropriate due to severity of illness   Dispo: The patient is from: Home              Anticipated d/c is to: Home              Patient  currently is not medically stable to d/c.   Difficult to place patient No   Consultants:  Nephrology   Procedures:  N/a   Antimicrobials:  N/a    Subjective: Pt reports less n/v and no diarrhea, tolerating breakfast so far.   Objective: Vitals:   05/14/20 2100 05/14/20 2245 05/15/20 0451 05/15/20 0820  BP: (!) 160/71 (!) 168/66 (!) 179/79   Pulse: 75 84 86   Resp: (!) 21 20 20    Temp:  98.3 F (36.8 C) 97.8 F (36.6 C)   TempSrc:  Oral    SpO2: 100% 100% 100% 99%  Weight:  100.9 kg    Height:  6\' 2"  (1.88 m)     No intake or output data in the 24 hours ending 05/15/20 1101 Filed Weights   05/14/20 1746 05/14/20 2245  Weight: 98.4 kg 100.9 kg    Examination:  General exam: Appears calm and comfortable  Respiratory system: Clear to auscultation. Respiratory effort normal. Cardiovasc: trace pedal edema. Gastrointestinal system: Abdomen is nondistended, soft and nontender. No organomegaly or masses felt. Normal bowel sounds heard. Central nervous system: Alert and oriented. No focal neurological deficits. Extremities: Symmetric 5 x 5 power. Skin: No rashes, lesions or ulcers Psychiatry: Judgement and insight appear normal. Mood & affect appropriate.   Data Reviewed: I have personally reviewed following labs and imaging studies  CBC: Recent Labs  Lab 05/14/20 1757 05/15/20 0552  WBC 11.5* 12.2*  NEUTROABS 10.7*  --  HGB 9.4* 8.6*  HCT 31.4* 28.4*  MCV 101.9* 97.3  PLT 206 518    Basic Metabolic Panel: Recent Labs  Lab 05/14/20 1757 05/15/20 0552  NA 138 140  K 6.5* 6.5*  CL 117* 117*  CO2 13* 15*  GLUCOSE 232* 232*  BUN 43* 39*  CREATININE 2.31* 2.04*  CALCIUM 9.5 9.6  MG  --  1.5*  PHOS  --  2.7    GFR: Estimated Creatinine Clearance: 45.2 mL/min (A) (by C-G formula based on SCr of 2.04 mg/dL (H)).  Liver Function Tests: Recent Labs  Lab 05/14/20 1757 05/15/20 0552  AST 17 12*  ALT 35 28  ALKPHOS 125 126  BILITOT 0.6 0.6  PROT 7.7  7.0  ALBUMIN 4.3 3.8    CBG: No results for input(s): GLUCAP in the last 168 hours.  Recent Results (from the past 240 hour(s))  Resp Panel by RT-PCR (Flu A&B, Covid) Nasopharyngeal Swab     Status: None   Collection Time: 05/14/20  9:02 PM   Specimen: Nasopharyngeal Swab; Nasopharyngeal(NP) swabs in vial transport medium  Result Value Ref Range Status   SARS Coronavirus 2 by RT PCR NEGATIVE NEGATIVE Final    Comment: (NOTE) SARS-CoV-2 target nucleic acids are NOT DETECTED.  The SARS-CoV-2 RNA is generally detectable in upper respiratory specimens during the acute phase of infection. The lowest concentration of SARS-CoV-2 viral copies this assay can detect is 138 copies/mL. A negative result does not preclude SARS-Cov-2 infection and should not be used as the sole basis for treatment or other patient management decisions. A negative result may occur with  improper specimen collection/handling, submission of specimen other than nasopharyngeal swab, presence of viral mutation(s) within the areas targeted by this assay, and inadequate number of viral copies(<138 copies/mL). A negative result must be combined with clinical observations, patient history, and epidemiological information. The expected result is Negative.  Fact Sheet for Patients:  EntrepreneurPulse.com.au  Fact Sheet for Healthcare Providers:  IncredibleEmployment.be  This test is no t yet approved or cleared by the [place] FDA and  has been authorized for detection and/or diagnosis of SARS-CoV-2 by FDA under an Emergency Use Authorization (EUA). This EUA will remain  in effect (meaning this test can be used) for the duration of the COVID-19 declaration under Section 564(b)(1) of the Act, 21 U.S.C.section 360bbb-3(b)(1), unless the authorization is terminated  or revoked sooner.       Influenza A by PCR NEGATIVE NEGATIVE Final   Influenza B by PCR NEGATIVE NEGATIVE Final     Comment: (NOTE) The Xpert Xpress SARS-CoV-2/FLU/RSV plus assay is intended as an aid in the diagnosis of influenza from Nasopharyngeal swab specimens and should not be used as a sole basis for treatment. Nasal washings and aspirates are unacceptable for Xpert Xpress SARS-CoV-2/FLU/RSV testing.  Fact Sheet for Patients: EntrepreneurPulse.com.au  Fact Sheet for Healthcare Providers: IncredibleEmployment.be  This test is not yet approved or cleared by the [place] FDA and has been authorized for detection and/or diagnosis of SARS-CoV-2 by FDA under an Emergency Use Authorization (EUA). This EUA will remain in effect (meaning this test can be used) for the duration of the COVID-19 declaration under Section 564(b)(1) of the Act, 21 U.S.C. section 360bbb-3(b)(1), unless the authorization is terminated or revoked.  Performed at [facility], [address], [place], [place] [address]      Radiology Studies: DG ABD ACUTE 2+V W 1V CHEST  Result Date: 05/14/2020 CLINICAL DATA:  Nausea and vomiting for  1 day EXAM: DG ABDOMEN ACUTE WITH 1 VIEW CHEST COMPARISON:  12/28/2016 FINDINGS: Cardiac shadow is within normal limits. Tortuous thoracic aorta is noted with mild calcifications. Lungs are well aerated bilaterally. Calcified granulomas are again seen bilaterally. No focal infiltrate or effusion is seen. Scattered large and small bowel gas is noted. No abnormal mass or abnormal calcifications are seen. No free air is noted. Prostate therapy seeds are noted. A double-J ureteral stent is noted in the pelvis likely related to transplant kidney. No acute bony abnormality is noted. IMPRESSION: No acute abnormality in the chest and abdomen. Ureteral stent on the right is noted consistent with transplant kidney. Electronically Signed   By: Inez Catalina M.D.   On: 05/14/2020 19:03    Scheduled Meds:  aspirin EC  81 mg Oral Daily   atorvastatin  20 mg Oral QHS    budesonide (PULMICORT) nebulizer solution  0.25 mg Nebulization BID   Chlorhexidine Gluconate Cloth  6 each Topical Daily   enoxaparin (LOVENOX) injection  40 mg Subcutaneous Q24H   fluticasone  2 spray Each Nare Daily   gabapentin  100 mg Oral BID   montelukast  10 mg Oral Daily   mycophenolate  720 mg Oral BID   pantoprazole (PROTONIX) IV  40 mg Intravenous Q24H   predniSONE  20 mg Oral Daily   Followed by   Derrill Memo ON 05/19/2020] predniSONE  15 mg Oral Daily   Followed by   Derrill Memo ON 05/26/2020] predniSONE  10 mg Oral Daily   Followed by   Derrill Memo ON 06/02/2020] predniSONE  5 mg Oral Daily   sodium zirconium cyclosilicate  10 g Oral TID   sulfamethoxazole-trimethoprim  1 tablet Oral Once per day on Mon Wed Fri   tacrolimus  8 mg Oral Q2000   tacrolimus  9 mg Oral QPC breakfast   tamsulosin  0.4 mg Oral Daily   umeclidinium bromide  1 puff Inhalation Daily   [START ON 05/16/2020] valGANciclovir  450 mg Oral Once per day on Mon Thu   Continuous Infusions:   sodium bicarbonate (isotonic) infusion in sterile water       LOS: 1 day   Time spent: 38 mins  Burleigh Brockmann Wynetta Emery, MD How to contact the [facility] Attending or Consulting provider [place] or covering provider during after hours [place], for this patient?  Check the care team in [facility] and look for a) attending/consulting [facility] provider listed and b) the [facility] team listed Log into [email or web address] and use [place]'s universal password to access. If you do not have the password, please contact the hospital operator. Locate the [facility] provider you are looking for under [facility] and page to a number that you can be directly reached. If you still have difficulty reaching the provider, please page the [facility] (Director on Call) for the Hospitalists listed on amion for assistance.  05/15/2020, 11:01 AM

## 2020-05-15 NOTE — Progress Notes (Signed)
*  PRELIMINARY RESULTS* Echocardiogram 2D Echocardiogram has been performed.  Bob Maldonado 05/15/2020, 11:53 AM

## 2020-05-15 NOTE — Progress Notes (Signed)
After PRN hydralazine 10mg  via IV patients BP is improved at 159/63   05/15/20 1500  Vitals  BP (!) 159/63  MEWS COLOR  MEWS Score Color Green  MEWS Score  MEWS Temp 0  MEWS Systolic 0  MEWS Pulse 0  MEWS RR 0  MEWS LOC 0  MEWS Score 0

## 2020-05-15 NOTE — Consult Note (Signed)
Reason for Consult: Hyperkalemia Referring Physician: Wynetta Emery, MD  Bob Maldonado is an 65 y.o. male with a PMH significant for HTN, DM type 2, OSA, h/o prostate cancer (s/p XRT 2014), and ESRD s/p DDKT on 05/25/99 at [facility] ([place]) complicated by delayed graft function requiring several HD sessions post transplant but has improved renal function and no longer requires HD.  He developed sudden onset N/V/D the day of admission.  He reports that he was seen at [facility] transplant clinic and felt fine in the morning but on the way home he had to pull over due to vomiting.  He was sent to [facility] ED for evaluation.  In the ED he was noted to have elevated BP at 172/66, increased respiratory rate at 26 but afebrile and NSR.  Labs were notable for negative covid test, K 6.5, BUN 43, Cr 2.31, BNP 215, Co2 13.  CXR and abdominal films were unrevealing.  He was admitted for IVF's and we were consulted to help address his electrolyte derangements.    Today he feels better without diarrhea or vomiting but still feels weak.  Scr has improved but K remains elevated. ECG without significant T wave changes.  Trend in Creatinine:  Creatinine, Ser  Date/Time Value Ref Range Status  05/15/2020 05:52 AM 2.04 (H) 0.61 - 1.24 mg/dL Final  05/14/2020 05:57 PM 2.31 (H) 0.61 - 1.24 mg/dL Final  06/29/2019 08:04 AM 11.50 (H) 0.61 - 1.24 mg/dL Final  04/05/2014 12:13 PM 8.30 (H) 0.50 - 1.35 mg/dL Final  03/29/2014 01:33 PM 9.45 (H) 0.50 - 1.35 mg/dL Final  06/02/2013 06:20 AM 7.17 (H) 0.50 - 1.35 mg/dL Final  06/01/2013 08:03 AM 10.20 (H) 0.50 - 1.35 mg/dL Final  06/01/2013 05:00 AM 10.25 (H) 0.50 - 1.35 mg/dL Final  05/30/2013 05:45 AM 10.35 (H) 0.50 - 1.35 mg/dL Final  05/27/2013 06:42 AM 16.20 (H) 0.50 - 1.35 mg/dL Final  05/26/2013 05:50 PM 16.26 (H) 0.50 - 1.35 mg/dL Final    PMH:   Past Medical History:  Diagnosis Date  . Arthritis   . At risk for sleep apnea    STOP-BANG= 5      SENT PCP 03-30-2014  . COPD (chronic  obstructive pulmonary disease) ([place])   . ESRD (end stage renal disease) on dialysis ([place])   . ESRD on hemodialysis [facility])    Nephrologist-- Dr Eddie Dibbles--  [facility] in [place]-- M/W/F  . GERD (gastroesophageal reflux disease)   . Gout    per pt stable as of 03-30-2014  . Hemorrhoid   . Hyperlipidemia   . Hypertension   . Prostate cancer ([place]) first dx in 03/16/11, 08/29/13   Gleason 7, volume 55 mL (urologist-  dr Diona Fanti)  treatment External Beam Radiation therapy  . S/P radiation therapy 02/13/2014 through 03/21/2014    Prostate/seminal vesicles 4500 cGy in 25 sessions   . Sleep apnea    does not use cpap  . Type 2 diabetes mellitus (HCC)    diet controlled - no meds  . Wears dentures   . Wears glasses     PSH:   Past Surgical History:  Procedure Laterality Date  . AV FISTULA PLACEMENT Left 05/30/2013   Procedure: Brachial vein transposition 1st stage;  Surgeon: Conrad Hicksville, MD;  Location: [place];  Service: Vascular;  Laterality: Left;  . BASCILIC VEIN TRANSPOSITION Left 07/25/2013   Procedure: SECOND STAGE BRACHIAL VEIN TRANSPOSITION;  Surgeon: Conrad Surprise, MD;  Location: [place];  Service: Vascular;  Laterality: Left;  .  COLONOSCOPY  06/01/2005  . EYE SURGERY Bilateral    cataracts removed  . INSERTION OF DIALYSIS CATHETER Right 05/30/2013   Procedure: INSERTION OF DIALYSIS CATHETER;  Surgeon: Conrad Polvadera, MD;  Location: [place];  Service: Vascular;  Laterality: Right;  . KNEE ARTHROSCOPY Right 1985  . PROSTATE BIOPSY  last one 08/29/13   Gleason 7, volume 55 mL  . RADIOACTIVE SEED IMPLANT N/A 04/05/2014   Procedure: RADIOACTIVE SEED IMPLANT;  Surgeon: Jorja Loa, MD;  Location: [facility];  Service: Urology;  Laterality: N/A;  60  SEEDS IMPLANTED  NO SEEDS FOUND IN BLADDER  . REVISON OF ARTERIOVENOUS FISTULA Left 06/29/2019   Procedure: REVISON OF LEFT BASILIC VEIN FISTULA;   Surgeon: [name], MD;  Location: [place];  Service: Vascular;  Laterality: Left;    Allergies:  Allergies  Allergen Reactions  . Metoprolol Cough    Medications:   Prior to Admission medications   Medication Sig Start Date End Date Taking? Authorizing Provider  acetaminophen (TYLENOL) 650 MG CR tablet Take 650 mg by mouth every 8 (eight) hours as needed for pain.   Yes [provider]  ALBUTEROL IN Inhale 2 puffs into the lungs every 4 (four) hours as needed (shortness of breath). Albuterol Inhaler as needed   Yes [provider]  aspirin EC 81 MG tablet Take 81 mg by mouth daily. Swallow whole. Take at 9 am   Yes [provider]  atorvastatin (LIPITOR) 20 MG tablet Take 1 tablet (20 mg total) by mouth daily. Patient taking differently: Take 20 mg by mouth at bedtime. 08/13/16  Yes , Modena Nunnery, MD  budesonide (PULMICORT) 180 MCG/ACT inhaler Inhale into the lungs 2 (two) times daily.   Yes [provider]  DULoxetine (CYMBALTA) 30 MG capsule Take 30 mg by mouth daily. Take at 9 am   Yes [provider]  fluticasone (FLONASE SENSIMIST) 27.5 MCG/SPRAY nasal spray Place 2 sprays into the nose daily.    Yes [provider]  gabapentin (NEURONTIN) 300 MG capsule Take 100 mg by mouth. Take 1 capsule by mouth every morning and every evening   Yes [provider]  insulin aspart (NOVOLOG) 100 UNIT/ML injection Inject 3 Units into the skin 3 (three) times daily before meals. Take 3 units at breakfast, 3 unitsa at lunch, and 3 units at dinner sliding scale. 200-250=give 1 extra unit at meal times, 251-300 give 2 extra units at meal times, 301-350= give 3 extra units at meal time.   Yes [provider]  insulin glargine (LANTUS) 100 UNIT/ML injection Inject 8 Units into the skin at bedtime.   Yes [provider]  loratadine (CLARITIN) 10 MG tablet Take 10 mg by mouth daily. Take at 9am   Yes [provider]   montelukast (SINGULAIR) 10 MG tablet Take 10 mg by mouth daily.   Yes [provider]  mycophenolate (CELLCEPT) 250 MG capsule Take 1,000 mg by mouth See admin instructions. Take 1000 mg at 9 am and 1000 mg at 9 pm 04/25/20  Yes [provider]  nystatin (MYCOSTATIN) 100000 UNIT/ML suspension Take 5 mLs by mouth 4 (four) times daily.   Yes [provider]  pantoprazole (PROTONIX) 40 MG tablet Take 40 mg by mouth daily.   Yes [provider]  predniSONE (DELTASONE) 5 MG tablet Take 20 mg by mouth daily. On 05/12/20 decrease to 20 mg (4 tablets) daily, On 05/19/20 decrease to 15 mg (3  tablets) daily, On 05/26/20 decrease to 10mg  (2 tablets) daily, On 06/02/20 decrease to 5 mg (1 tablet) daily-stay on this dose 04/26/20  Yes [provider]  sennosides-docusate sodium (SENOKOT-S) 8.6-50 MG tablet Take 1 tablet by mouth daily. Take 2 tablets at 9 am and 2 tablets at 5 pm.   Yes [provider]  sulfamethoxazole-trimethoprim (BACTRIM DS) 800-160 MG tablet Take 1 tablet by mouth. Take 1 tablet at 9 am on Mon, Wed, and Friday with food   Yes [provider]  tacrolimus (PROGRAF) 1 MG capsule Take 1 mg by mouth See admin instructions. Take 9 mg at 9 am and 8 mg at 9pm 05/05/20  Yes [provider]  tamsulosin (FLOMAX) 0.4 MG CAPS capsule Take 1 capsule (0.4 mg total) by mouth 2 (two) times daily. Patient taking differently: Take 0.4 mg by mouth daily. Take at [facility] 08/13/16  Yes [place], Modena Nunnery, MD  tiotropium (SPIRIVA) 18 MCG inhalation capsule Place 18 mcg into inhaler and inhale daily.   Yes [provider]  valGANciclovir (VALCYTE) 450 MG tablet Take 450 mg by mouth daily. Take 450 mg on Monday and Thursday with largest meal of the day.   Yes [provider]  ALLOPURINOL PO Take by mouth at bedtime.  Patient not taking: No sig reported    [provider]  amLODipine (NORVASC) 10 MG tablet Take by mouth at bedtime.   Patient not taking: No sig reported    [provider]  cetirizine (ZYRTEC) 10 MG tablet Take 10 mg by mouth at bedtime.  Patient not taking: No sig reported    [provider]  ferric citrate (AURYXIA) 1 GM 210 MG(Fe) tablet Take by mouth. 2 PO TID with meals and 1 PO BID with snacks Patient not taking: No sig reported    [provider]  Multiple Vitamin (RENAL MULTIVITAMIN/ZINC PO) Take 1 tablet by mouth at bedtime.  Patient not taking: No sig reported    [provider]  ofloxacin (OCUFLOX) 0.3 % ophthalmic solution 1 drop 4 (four) times daily. Patient not taking: No sig reported    [provider]  Olopatadine HCl (PATADAY) 0.2 % SOLN 1 drop each eye daily prn Patient not taking: No sig reported 07/29/16   Alycia Rossetti, MD  Vitamin D, Ergocalciferol, (DRISDOL) 50000 units CAPS capsule TAKE 1 CAPSULE BY MOUTH ONCE A MONTH Patient not taking: No sig reported 08/13/16   Alycia Rossetti, MD    Inpatient medications: . aspirin EC  81 mg Oral Daily  . atorvastatin  20 mg Oral QHS  . budesonide (PULMICORT) nebulizer solution  0.25 mg Nebulization BID  . Chlorhexidine Gluconate Cloth  6 each Topical Daily  . enoxaparin (LOVENOX) injection  40 mg Subcutaneous Q24H  . fluticasone  2 spray Each Nare Daily  . gabapentin  100 mg Oral BID  . montelukast  10 mg Oral Daily  . mycophenolate  1,000 mg Oral BID  . pantoprazole (PROTONIX) IV  40 mg Intravenous Q24H  . predniSONE  20 mg Oral Daily   Followed by  . [START ON 05/19/2020] predniSONE  15 mg Oral Daily   Followed by  . [START ON 05/26/2020] predniSONE  10 mg Oral Daily   Followed by  . [START ON 06/02/2020] predniSONE  5 mg Oral Daily  . sodium zirconium cyclosilicate  10 g Oral TID  . sulfamethoxazole-trimethoprim  1 tablet Oral Once per day on Mon Wed Fri  . tacrolimus  8 mg Oral Q2000  . tacrolimus  9 mg Oral QPC breakfast  . tamsulosin  0.4 mg Oral Daily  . umeclidinium bromide  1  puff Inhalation Daily  . [START ON 05/16/2020] valGANciclovir  450 mg Oral Once per day on Mon Thu    Discontinued Meds:   Medications Discontinued During This Encounter  Medication Reason  . prochlorperazine (COMPAZINE) tablet 10 mg   . budesonide (PULMICORT) 180 MCG/ACT inhaler 1 puff P&T Policy: Therapeutic Substitute  . fluticasone (VERAMYST) nasal spray 2 spray P&T Policy: Therapeutic Substitute  . tiotropium (SPIRIVA) inhalation capsule ([facility] use ONLY) 18 mcg P&T Policy: Therapeutic Substitute  . predniSONE (DELTASONE) tablet 20 mg   . sodium zirconium cyclosilicate (LOKELMA) packet 10 g     Social History:  reports that he quit smoking about 29 years ago. His smoking use included cigarettes. He has a 20.00 pack-year smoking history. He has never used smokeless tobacco. He reports previous alcohol use. He reports that he does not use drugs.  Family History:   Family History  Problem Relation Age of Onset  . Diabetes Mother   . Heart disease Mother   . Heart attack Mother   . Cancer Father   . Heart disease Brother   . Colon cancer Neg Hx     Pertinent items are noted in HPI. Weight change:  No intake or output data in the 24 hours ending 05/15/20 1020 BP (!) 179/79 (BP Location: Right Arm)   Pulse 86   Temp 97.8 F (36.6 C)   Resp 20   Ht 6\' 2"  (1.88 m)   Wt 100.9 kg   SpO2 99%   BMI 28.56 kg/m  Vitals:   05/14/20 2100 05/14/20 2245 05/15/20 0451 05/15/20 0820  BP: (!) 160/71 (!) 168/66 (!) 179/79   Pulse: 75 84 86   Resp: (!) 21 20 20    Temp:  98.3 F (36.8 C) 97.8 F (36.6 C)   TempSrc:  Oral    SpO2: 100% 100% 100% 99%  Weight:  100.9 kg    Height:  6\' 2"  (1.88 m)       General appearance: fatigued and no distress Head: Normocephalic, without obvious abnormality, atraumatic Eyes: negative findings: lids and lashes normal, conjunctivae and sclerae normal and corneas clear Resp: clear to auscultation bilaterally Cardio: regular rate and rhythm, S1, S2  normal, no murmur, click, rub or gallop GI: soft, non-tender; bowel sounds normal; no masses,  no organomegaly and renal allograft in RLQ, no bruits or tenderness Extremities: extremities normal, atraumatic, no cyanosis or edema and LUE AVF +T/B  Labs: Basic Metabolic Panel: Recent Labs  Lab 05/14/20 1757 05/15/20 0552  NA 138 140  K 6.5* 6.5*  CL 117* 117*  CO2 13* 15*  GLUCOSE 232* 232*  BUN 43* 39*  CREATININE 2.31* 2.04*  ALBUMIN 4.3 3.8  CALCIUM 9.5 9.6  PHOS  --  2.7   Liver Function Tests: Recent Labs  Lab 05/14/20 1757 05/15/20 0552  AST 17 12*  ALT 35 28  ALKPHOS 125 126  BILITOT 0.6 0.6  PROT 7.7 7.0  ALBUMIN 4.3 3.8   No results for input(s): LIPASE, AMYLASE in the last 168 hours. No results for input(s): AMMONIA in the last 168 hours. CBC: Recent Labs  Lab 05/14/20 1757 05/15/20 0552  WBC 11.5* 12.2*  NEUTROABS 10.7*  --   HGB 9.4* 8.6*  HCT 31.4* 28.4*  MCV 101.9* 97.3  PLT 206 196   PT/INR: @LABRCNTIP (inr:5) Cardiac  Enzymes: )No results for input(s): CKTOTAL, CKMB, CKMBINDEX, TROPONINI in the last 168 hours. CBG: No results for input(s): GLUCAP in the last 168 hours.  Iron Studies: No results for input(s): IRON, TIBC, TRANSFERRIN, FERRITIN in the last 168 hours.  Xrays/Other Studies: DG ABD ACUTE 2+V W 1V CHEST  Result Date: 05/14/2020 CLINICAL DATA:  Nausea and vomiting for 1 day EXAM: DG ABDOMEN ACUTE WITH 1 VIEW CHEST COMPARISON:  12/28/2016 FINDINGS: Cardiac shadow is within normal limits. Tortuous thoracic aorta is noted with mild calcifications. Lungs are well aerated bilaterally. Calcified granulomas are again seen bilaterally. No focal infiltrate or effusion is seen. Scattered large and small bowel gas is noted. No abnormal mass or abnormal calcifications are seen. No free air is noted. Prostate therapy seeds are noted. A double-J ureteral stent is noted in the pelvis likely related to transplant kidney. No acute bony abnormality is  noted. IMPRESSION: No acute abnormality in the chest and abdomen. Ureteral stent on the right is noted consistent with transplant kidney. Electronically Signed   By: Inez Catalina M.D.   On: 05/14/2020 19:03     Assessment/Plan: 1. Nausea, vomiting, diarrhea - sudden onset, possible food poisoning or viral gastroenteritis.  No diarrhea since admission so no infectious studies ordered.  He did have diarrhea prior to episode of N/V and possibly due to to cellcept and will try myfortic and follow. 1. Abdominal CT scan pending 2. Hyperkalemia - unclear etiology as his renal function has improved.  He does have a non anion gap acidosis consistent with diarrhea but also possible RTA.  He was given lokelma, IV Calcium, insulin/D50, without significant improvement.   1. Will start isotonic bicarb and follow.  Recheck K later today. 2. If still elevated could give IV lasix 20 mg x 1  3. S/p DDKT on 10/23/26- complicated by delayed graft function but now with improvement.  Continue with prograf 9 mg qam 8 mg qpm, prednisone taper, and change cellcept 1 gram bid to myfortic 720 mg bid and follow his GI symptoms. 4. HTN- new and was normal at his transplant follow up visit.  Will start amlodipine and follow (avoid ACE/ARB/BB given ^K) 5. DM type 2- per primary 6. Immunosuppressive agents - prograf level was 5 yesterday.      Bob Maldonado A Jesus Poplin 05/15/2020, 10:20 AM

## 2020-05-16 LAB — CBC WITH DIFFERENTIAL/PLATELET
Abs Immature Granulocytes: 0.14 10*3/uL — ABNORMAL HIGH (ref 0.00–0.07)
Basophils Absolute: 0 10*3/uL (ref 0.0–0.1)
Basophils Relative: 0 %
Eosinophils Absolute: 0 10*3/uL (ref 0.0–0.5)
Eosinophils Relative: 0 %
HCT: 27.3 % — ABNORMAL LOW (ref 39.0–52.0)
Hemoglobin: 8.4 g/dL — ABNORMAL LOW (ref 13.0–17.0)
Immature Granulocytes: 1 %
Lymphocytes Relative: 3 %
Lymphs Abs: 0.3 10*3/uL — ABNORMAL LOW (ref 0.7–4.0)
MCH: 29.9 pg (ref 26.0–34.0)
MCHC: 30.8 g/dL (ref 30.0–36.0)
MCV: 97.2 fL (ref 80.0–100.0)
Monocytes Absolute: 0.7 10*3/uL (ref 0.1–1.0)
Monocytes Relative: 6 %
Neutro Abs: 11 10*3/uL — ABNORMAL HIGH (ref 1.7–7.7)
Neutrophils Relative %: 90 %
Platelets: 215 10*3/uL (ref 150–400)
RBC: 2.81 MIL/uL — ABNORMAL LOW (ref 4.22–5.81)
RDW: 13.5 % (ref 11.5–15.5)
WBC: 12.2 10*3/uL — ABNORMAL HIGH (ref 4.0–10.5)
nRBC: 0 % (ref 0.0–0.2)

## 2020-05-16 LAB — MAGNESIUM: Magnesium: 1.5 mg/dL — ABNORMAL LOW (ref 1.7–2.4)

## 2020-05-16 LAB — RENAL FUNCTION PANEL
Albumin: 3.7 g/dL (ref 3.5–5.0)
Anion gap: 9 (ref 5–15)
BUN: 34 mg/dL — ABNORMAL HIGH (ref 8–23)
CO2: 17 mmol/L — ABNORMAL LOW (ref 22–32)
Calcium: 9.2 mg/dL (ref 8.9–10.3)
Chloride: 114 mmol/L — ABNORMAL HIGH (ref 98–111)
Creatinine, Ser: 2.18 mg/dL — ABNORMAL HIGH (ref 0.61–1.24)
GFR, Estimated: 33 mL/min — ABNORMAL LOW (ref >60)
Glucose, Bld: 174 mg/dL — ABNORMAL HIGH (ref 70–99)
Phosphorus: 2.6 mg/dL (ref 2.5–4.6)
Potassium: 5 mmol/L (ref 3.5–5.1)
Sodium: 140 mmol/L (ref 135–145)

## 2020-05-16 LAB — GLUCOSE, CAPILLARY
Glucose-Capillary: 196 mg/dL — ABNORMAL HIGH (ref 70–99)
Glucose-Capillary: 173 mg/dL — ABNORMAL HIGH (ref 70–99)
Glucose-Capillary: 182 mg/dL — ABNORMAL HIGH (ref 70–99)

## 2020-05-16 MED ORDER — MYCOPHENOLATE SODIUM 360 MG PO TBEC: 720.0000 mg | DELAYED_RELEASE_TABLET | Freq: Two times a day (BID) | ORAL | 0 refills | Status: AC

## 2020-05-16 MED ORDER — SODIUM ZIRCONIUM CYCLOSILICATE 10 G PO PACK
10.0000 g | PACK | Freq: Three times a day (TID) | ORAL | Status: AC
  Administered 2020-05-16: 10 g via ORAL
  Filled 2020-05-16: qty 1

## 2020-05-16 MED ORDER — AMLODIPINE BESYLATE 5 MG PO TABS: 5.0000 mg | ORAL_TABLET | Freq: Every day | ORAL | 2 refills | Status: AC

## 2020-05-16 MED ORDER — MAGNESIUM SULFATE 4 GM/100ML IV SOLN
4.0000 g | Freq: Once | INTRAVENOUS | Status: AC
  Administered 2020-05-16: 4 g via INTRAVENOUS
  Filled 2020-05-16: qty 100

## 2020-05-16 MED ORDER — INSULIN GLARGINE 100 UNIT/ML ~~LOC~~ SOLN: 16.0000 [IU] | Freq: Every day | SUBCUTANEOUS | 11 refills | Status: AC

## 2020-05-16 NOTE — Progress Notes (Signed)
Patient ID: Bob Maldonado, male   DOB: Sep 05, 1955, 65 y.o.   MRN: [id number] S: He feels much better this morning.  "I'm ready to go" O:BP (!) 183/72   Pulse 86   Temp 98 F (36.7 C)   Resp 19   Ht 6\' 2"  (1.88 m)   Wt 100.9 kg   SpO2 100%   BMI 28.56 kg/m   Intake/Output Summary (Last 24 hours) at 05/16/2020 1009 Last data filed at 05/15/2020 2300 Gross per 24 hour  Intake 149.78 ml  Output 1 ml  Net 148.78 ml   Intake/Output: I/O last 3 completed shifts: In: 149.8 [I.V.:149.8] Out: 1 [Urine:1]  Intake/Output this shift:  No intake/output data recorded. 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, nontender, no bruits Ext: no edema, LUE AVF +T/B  Recent Labs  Lab 05/14/20 1757 05/15/20 0552 05/16/20 0547  NA 138 140 140  K 6.5* 6.5* 5.0  CL 117* 117* 114*  CO2 13* 15* 17*  GLUCOSE 232* 232* 174*  BUN 43* 39* 34*  CREATININE 2.31* 2.04* 2.18*  ALBUMIN 4.3 3.8 3.7  CALCIUM 9.5 9.6 9.2  PHOS  --  2.7 2.6  AST 17 12*  --   ALT 35 28  --    Liver Function Tests: Recent Labs  Lab 05/14/20 1757 05/15/20 0552 05/16/20 0547  AST 17 12*  --   ALT 35 28  --   ALKPHOS 125 126  --   BILITOT 0.6 0.6  --   PROT 7.7 7.0  --   ALBUMIN 4.3 3.8 3.7   No results for input(s): LIPASE, AMYLASE in the last 168 hours. No results for input(s): AMMONIA in the last 168 hours. CBC: Recent Labs  Lab 05/14/20 1757 05/15/20 0552 05/16/20 0547  WBC 11.5* 12.2* 12.2*  NEUTROABS 10.7*  --  11.0*  HGB 9.4* 8.6* 8.4*  HCT 31.4* 28.4* 27.3*  MCV 101.9* 97.3 97.2  PLT 206 196 215   Cardiac Enzymes: No results for input(s): CKTOTAL, CKMB, CKMBINDEX, TROPONINI in the last 168 hours. CBG: Recent Labs  Lab 05/15/20 1614 05/15/20 2049 05/16/20 0300 05/16/20 0738  GLUCAP 279* 265* 196* 173*    Iron Studies: No results for input(s): IRON, TIBC, TRANSFERRIN, FERRITIN in the last 72 hours. Studies/Results: DG ABD ACUTE 2+V W 1V  CHEST  Result Date: 05/14/2020 CLINICAL DATA:  Nausea and vomiting for 1 day EXAM: DG ABDOMEN ACUTE WITH 1 VIEW CHEST COMPARISON:  12/28/2016 FINDINGS: Cardiac shadow is within normal limits. Tortuous thoracic aorta is noted with mild calcifications. Lungs are well aerated bilaterally. Calcified granulomas are again seen bilaterally. No focal infiltrate or effusion is seen. Scattered large and small bowel gas is noted. No abnormal mass or abnormal calcifications are seen. No free air is noted. Prostate therapy seeds are noted. A double-J ureteral stent is noted in the pelvis likely related to transplant kidney. No acute bony abnormality is noted. IMPRESSION: No acute abnormality in the chest and abdomen. Ureteral stent on the right is noted consistent with transplant kidney. Electronically Signed   By: Inez Catalina M.D.   On: 05/14/2020 19:03   ECHOCARDIOGRAM COMPLETE  Result Date: 05/15/2020    ECHOCARDIOGRAM REPORT   Patient Name:   Bob Maldonado Date of Exam: 05/15/2020 Medical Rec #:  [id number]       Height:       74.0 in Accession #:    [id number]  Weight:       222.4 lb Date of Birth:  November 11, 1955       BSA:          2.274 m Patient Age:    78 years        BP:           179/79 mmHg Patient Gender: M               HR:           86 bpm. Exam Location:  [facility] Procedure: 2D Echo Indications:    CHF-Acute Diastolic G81.15  History:        Patient has prior history of Echocardiogram examinations, most                 recent 01/05/2020. COPD; Risk Factors:Hypertension,                 Dyslipidemia, Former Smoker and Diabetes. ESRD, S/P kidney                 transplant.  Sonographer:    Leavy Cella RDCS (AE) Referring Phys: [id number] OLADAPO ADEFESO IMPRESSIONS  1. Left ventricular ejection fraction, by estimation, is 65 to 70%. The left ventricle has normal function. The left ventricle has no regional wall motion abnormalities. There is severe left ventricular hypertrophy. Left ventricular diastolic  parameters  were normal.  2. Right ventricular systolic function is normal. The right ventricular size is normal. Tricuspid regurgitation signal is inadequate for assessing PA pressure.  3. The mitral valve is grossly normal, mild annular calcification. Trivial mitral valve regurgitation.  4. The aortic valve is tricuspid. Aortic valve regurgitation is not visualized. Mild aortic valve stenosis. Aortic valve mean gradient measures 15.3 mmHg. Aortic valve Vmax measures 2.84 m/s.  5. Unable to estimate CVP. The inferior vena cava is normal in size with greater than 50% respiratory variability, suggesting right atrial pressure of 3 mmHg. FINDINGS  Left Ventricle: Left ventricular ejection fraction, by estimation, is 65 to 70%. The left ventricle has normal function. The left ventricle has no regional wall motion abnormalities. The left ventricular internal cavity size was normal in size. There is  severe left ventricular hypertrophy. Left ventricular diastolic parameters were normal. Right Ventricle: The right ventricular size is normal. No increase in right ventricular wall thickness. Right ventricular systolic function is normal. Tricuspid regurgitation signal is inadequate for assessing PA pressure. Left Atrium: Left atrial size was normal in size. Right Atrium: Right atrial size was normal in size. Pericardium: There is no evidence of pericardial effusion. Mitral Valve: The mitral valve is grossly normal. Mild mitral annular calcification. Trivial mitral valve regurgitation. Tricuspid Valve: The tricuspid valve is grossly normal. Tricuspid valve regurgitation is trivial. Aortic Valve: The aortic valve is tricuspid. There is mild aortic valve annular calcification. Aortic valve regurgitation is not visualized. Mild aortic stenosis is present. Aortic valve mean gradient measures 15.3 mmHg. Aortic valve peak gradient measures 32.2 mmHg. Aortic valve area, by VTI measures 1.57 cm. Pulmonic Valve: The pulmonic valve  was grossly normal. Pulmonic valve regurgitation is trivial. Aorta: The aortic root is normal in size and structure. Venous: Unable to estimate CVP. The inferior vena cava was not well visualized. The inferior vena cava is normal in size with greater than 50% respiratory variability, suggesting right atrial pressure of 3 mmHg. IAS/Shunts: The interatrial septum was not well visualized.  LEFT VENTRICLE PLAX 2D LVIDd:         5.21 cm  Diastology LVIDs:         2.66 cm  LV e' medial:    6.96 cm/s LV PW:         1.89 cm  LV E/e' medial:  13.4 LV IVS:        1.60 cm  LV e' lateral:   13.90 cm/s LVOT diam:     2.00 cm  LV E/e' lateral: 6.7 LV SV:         92 LV SV Index:   40 LVOT Area:     3.14 cm  RIGHT VENTRICLE RV S prime:     21.30 cm/s TAPSE (M-mode): 3.1 cm LEFT ATRIUM             Index       RIGHT ATRIUM           Index LA diam:        4.00 cm 1.76 cm/m  RA Area:     14.10 cm LA Vol (A2C):   72.6 ml 31.93 ml/m RA Volume:   31.20 ml  13.72 ml/m LA Vol (A4C):   42.2 ml 18.56 ml/m LA Biplane Vol: 59.2 ml 26.03 ml/m  AORTIC VALVE AV Area (Vmax):    1.48 cm AV Area (Vmean):   1.44 cm AV Area (VTI):     1.57 cm AV Vmax:           283.67 cm/s AV Vmean:          179.333 cm/s AV VTI:            0.583 m AV Peak Grad:      32.2 mmHg AV Mean Grad:      15.3 mmHg LVOT Vmax:         133.67 cm/s LVOT Vmean:        82.167 cm/s LVOT VTI:          0.291 m LVOT/AV VTI ratio: 0.50  AORTA Ao Root diam: 3.30 cm MITRAL VALVE MV Area (PHT): 3.97 cm     SHUNTS MV Decel Time: 191 msec     Systemic VTI:  0.29 m MV E velocity: 93.00 cm/s   Systemic Diam: 2.00 cm MV A velocity: 106.00 cm/s MV E/A ratio:  0.88 Rozann Lesches MD Electronically signed by Rozann Lesches MD Signature Date/Time: 05/15/2020/12:59:08 PM    Final    . amLODipine  5 mg Oral Daily  . aspirin EC  81 mg Oral Daily  . atorvastatin  20 mg Oral QHS  . budesonide (PULMICORT) nebulizer solution  0.25 mg Nebulization BID  . Chlorhexidine Gluconate Cloth  6 each  Topical Daily  . enoxaparin (LOVENOX) injection  40 mg Subcutaneous Q24H  . feeding supplement  1 Container Oral BID BM  . fluticasone  2 spray Each Nare Daily  . gabapentin  100 mg Oral BID  . insulin aspart  0-15 Units Subcutaneous TID WC  . insulin aspart  0-5 Units Subcutaneous QHS  . insulin aspart  5 Units Subcutaneous TID WC  . insulin glargine  16 Units Subcutaneous QHS  . montelukast  10 mg Oral Daily  . mycophenolate  720 mg Oral BID  . pantoprazole (PROTONIX) IV  40 mg Intravenous Q24H  . predniSONE  20 mg Oral Daily   Followed by  . [START ON 05/19/2020] predniSONE  15 mg Oral Daily   Followed by  . [START ON 05/26/2020] predniSONE  10 mg Oral Daily   Followed by  . [START ON 06/02/2020] predniSONE  5 mg Oral Daily  . sulfamethoxazole-trimethoprim  1 tablet Oral Once per day on Mon Wed Fri  . tacrolimus  8 mg Oral Q2000  . tacrolimus  9 mg Oral QPC breakfast  . tamsulosin  0.4 mg Oral Daily  . umeclidinium bromide  1 puff Inhalation Daily  . valGANciclovir  450 mg Oral Once per day on Mon Thu    BMET    Component Value Date/Time   NA 140 05/16/2020 0547   K 5.0 05/16/2020 0547   CL 114 (H) 05/16/2020 0547   CO2 17 (L) 05/16/2020 0547   GLUCOSE 174 (H) 05/16/2020 0547   BUN 34 (H) 05/16/2020 0547   CREATININE 2.18 (H) 05/16/2020 0547   CREATININE 6.24 (H) 07/29/2016 1233   CALCIUM 9.2 05/16/2020 0547   GFRNONAA 33 (L) 05/16/2020 0547   GFRNONAA 9 (L) 07/12/2012 1631   GFRAA 6 (L) 03/29/2014 1333   GFRAA 10 (L) 07/12/2012 1631   CBC    Component Value Date/Time   WBC 12.2 (H) 05/16/2020 0547   RBC 2.81 (L) 05/16/2020 0547   HGB 8.4 (L) 05/16/2020 0547   HCT 27.3 (L) 05/16/2020 0547   PLT 215 05/16/2020 0547   MCV 97.2 05/16/2020 0547   MCH 29.9 05/16/2020 0547   MCHC 30.8 05/16/2020 0547   RDW 13.5 05/16/2020 0547   LYMPHSABS 0.3 (L) 05/16/2020 0547   MONOABS 0.7 05/16/2020 0547   EOSABS 0.0 05/16/2020 0547   BASOSABS 0.0 05/16/2020 0547      Assessment/Plan: 1. Nausea, vomiting, diarrhea - sudden onset, possible food poisoning or viral gastroenteritis.  No diarrhea since admission so no infectious studies ordered.  He did have diarrhea prior to episode of N/V and possibly due to to cellcept. 1. Started myfortic 720 mg bid  2. N/V/D have resolved. 2. Hyperkalemia - unclear etiology as his renal function has improved.  He does have a non anion gap acidosis consistent with diarrhea but also possible RTA.  He was given lokelma, IV Calcium, insulin/D50, without significant improvement.   1. Improved with isotonic bicarb. 2. Low K diet reviewed by RD with patient. 3. Has labs scheduled as an outpatient on 05/21/20    3. Non anion gap acidosis - likely due to diarrhea.  Improved with sodium bicarb and IVF's.  4. 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  Continue with prograf 9 mg qam 8 mg qpm, prednisone taper, and change cellcept 1 gram bid to myfortic 720 mg bid and follow his GI symptoms. 5. HTN- new and was normal at his transplant follow up visit.  Will start amlodipine 5 mg and follow (avoid ACE/ARB/BB given Raliegh Ip) 6. DM type 2- per primary 7. Immunosuppressive agents - prograf level was 5 yesterday.   8. Elevated BNP - ECHO with normal EF. 9. Disposition - stable for discharge to home with f/u at [facility] transplant clinic and repeat labs on 05/21/20 as an outpatient.  Will sign off.    Donetta Potts, MD [employer] [phone]

## 2020-05-16 NOTE — TOC Transition Note (Signed)
Transition of Care [facility]) - CM/SW Discharge Note   Patient Details  Name: Bob Maldonado MRN: [id number] Date of Birth: 25-Jun-1955  [facility] CM/SW Contact:  Shade Flood, LCSW Phone Number: 05/16/2020, 10:58 AM   Clinical Narrative:     Pt stable for dc per MD. [facility] resumption orders entered in the chart. Updated Santiago Glad at [employer] of pt's dc.  No other TOC needs for dc.  Final next level of care: [place] Barriers to Discharge: Barriers Resolved   Patient Goals and CMS Choice Patient states their goals for this hospitalization and ongoing recovery are:: return home   Choice offered to / list presented to : Patient  Discharge Placement                       Discharge Plan and Services In-house Referral: Clinical Social Work   Post Acute Care Choice: Resumption of Svcs/PTA Provider          DME Arranged: N/A DME Agency: NA       HH Arranged: PT [place] Agency: [facility] Date [place]: 05/15/20 Time [place]: 0808 Representative spoke with at [place]: Penni Homans  Social Determinants of Health (SDOH) Interventions     Readmission Risk Interventions Readmission Risk Prevention Plan 05/15/2020  Transportation Screening Complete  Medication Review Press photographer) Complete  HRI or [place] Complete  SW Recovery Care/Counseling Consult Complete  [place] Not Applicable  Some recent data might be hidden

## 2020-05-16 NOTE — Discharge Summary (Addendum)
Physician Discharge Summary  Bob Maldonado DXI:[id number] DOB: 08/24/1955 DOA: 05/14/2020  PCP: Alycia Rossetti, MD  Admit date: 05/14/2020 Discharge date: 05/16/2020  Admitted From:  Home  Disposition:  Home with [facility]   Recommendations for Outpatient Follow-up:  1. Follow up with [facility] as scheduled 2. Please follow up with lab appointment for repeat labs on 05/21/20 as scheduled  Home Health:  PT   Discharge Condition: STABLE   CODE STATUS: FULL DIET: low potassium foods, carb modified, no concentrated sweets  Brief Hospitalization Summary: Please see all hospital notes, images, labs for full details of the hospitalization. ADMISSION HPI: Bob Maldonado is a 65 y.o. male with medical history significant for ESRD s/p kidney transplant (about 3 weeks ago), T2DM, hypertension, hyperlipidemia, GERD, COPD and BPH who presents to the emergency department due to nausea and vomiting that started today.  Patient went for a follow-up with his nephrologist at [facility] s/p kidney transplant, blood work was done and his potassium level was at 6.5.  He had a sandwich wrap and a smoothie with wife for lunch after leaving the nephrologist's office.  On his way back to [place], he started vomiting, vomitus contained food and drink that he recently had, no blood in vomitus.  This was associated with nausea.  Patient endorsed 3-4 loose bowel movement ([place] stool chart -type 6) within the last 2 to 3 days.  He denies chest pain, shortness of breath, abdominal pain, fever, chills.  ED Course:  In the emergency department, BP was 172/66, other vital signs were within normal range.  Work-up in the ED showed leukocytosis, macrocytic anemia, BUN/creatinine 43/2.31.  Hyperkalemia, BNP 215, troponin x1 was negative.  Acute abdomen with chest x-ray showed no acute abnormality in the chest and abdomen.  Ureteral stent on the right is noted consistent with transplant kidney.  IV calcium gluconate 1 g  was given to protect the heart.  IV insulin therapy, D50 as well as Lokelma were given due to hyperkalemia.  Bicarb was also given.  IV Zofran due to nausea and vomiting was given.  ED physician spoke with nephrologist who states that patient can be admitted here at [facility].  Wife spoke with transplant team at [facility] and they were fine with the patient staying here per ED medical record.  Hospitalist was asked to admit patient for further evaluation and management.    HOSPITAL COURSE   1. Hyperkalemia - resolved now, down to 5.0.  He was treated with lokelma 10 mg  TID x 3 doses, additional dose given this morning.  He has repeat outpatient labs scheduled for 05/21/20.  2. Gastroenteritis - symptoms have fully resolved now.  He is tolerating diet well.  3. Postop s/p renal transplant - appreciate renal consultation for med management. Cellcept was replaced with myfortic 750 mg BID by nephrology service.  Follow up with [facility].   4. Nonanion gap metabolic acidosis - He was treated with IV bicarbonate infusion in hospital and now improved likely was due to earlier diarrhea.  5. Type 2 diabetes mellitus -with steroid induced hyperglycemia, poorly controlled, increased lantus dose to 16 units for better glycemic control.  Continue to work with outpatient providers for assistance with glycemic control.    6. Leukocytosis - steroid induced.  7. Essential hypertension - started on amlodipine 5 mg daily by nephrology service. Avoiding ACE/ARB 8. Peripheral neuropathy - on gabapentin.  9. Hyperlipidemia - on atorvastatin.  10. COPD - stable on home bronchodilators.  11. BPH - remains well on flomax.  12. Elevated BNP  - 2D echocardiogram came back within normal limits.  13. Hypomagnesemia - IV replacement was given for repletion.   DVT prophylaxis: enoxaparin Code Status: full  Family Communication: wife Disposition: home  Status is: Inpatient  Remains inpatient appropriate because:IV  treatments appropriate due to intensity of illness or inability to take PO and Inpatient level of care appropriate due to severity of illness  Discharge Diagnoses:  Active Problems:   Essential hypertension   Hyperlipidemia   Hyperkalemia   Nausea & vomiting   Diarrhea   S/P kidney transplant   Hyperglycemia due to diabetes mellitus (HCC)   Leukocytosis   Macrocytic anemia   GERD (gastroesophageal reflux disease)   COPD (chronic obstructive pulmonary disease) (HCC)   BPH (benign prostatic hyperplasia)   Discharge Instructions:  Allergies as of 05/16/2020      Reactions   Metoprolol Cough      Medication List    STOP taking these medications   ALLOPURINOL PO   cetirizine 10 MG tablet Commonly known as: ZYRTEC   ferric citrate 1 GM 210 MG(Fe) tablet Commonly known as: AURYXIA   mycophenolate 250 MG capsule Commonly known as: CELLCEPT   ofloxacin 0.3 % ophthalmic solution Commonly known as: OCUFLOX   Olopatadine HCl 0.2 % Soln Commonly known as: Pataday   RENAL MULTIVITAMIN/ZINC PO   Vitamin D (Ergocalciferol) 1.25 MG (50000 UNIT) Caps capsule Commonly known as: DRISDOL     TAKE these medications   acetaminophen 650 MG CR tablet Commonly known as: TYLENOL Take 650 mg by mouth every 8 (eight) hours as needed for pain.   ALBUTEROL IN Inhale 2 puffs into the lungs every 4 (four) hours as needed (shortness of breath). Albuterol Inhaler as needed   amLODipine 5 MG tablet Commonly known as: NORVASC Take 1 tablet (5 mg total) by mouth daily. What changed:   medication strength  how much to take  when to take this   aspirin EC 81 MG tablet Take 81 mg by mouth daily. Swallow whole. Take at 9 am   atorvastatin 20 MG tablet Commonly known as: LIPITOR Take 1 tablet (20 mg total) by mouth daily. What changed: when to take this   budesonide 180 MCG/ACT inhaler Commonly known as: PULMICORT Inhale into the lungs 2 (two) times daily.   DULoxetine 30 MG  capsule Commonly known as: CYMBALTA Take 30 mg by mouth daily. Take at 9 am   Flonase Sensimist 27.5 MCG/SPRAY nasal spray Generic drug: fluticasone Place 2 sprays into the nose daily.   gabapentin 300 MG capsule Commonly known as: NEURONTIN Take 100 mg by mouth. Take 1 capsule by mouth every morning and every evening   insulin aspart 100 UNIT/ML injection Commonly known as: novoLOG Inject 3 Units into the skin 3 (three) times daily before meals. Take 3 units at breakfast, 3 unitsa at lunch, and 3 units at dinner sliding scale. 200-250=give 1 extra unit at meal times, 251-300 give 2 extra units at meal times, 301-350= give 3 extra units at meal time.   insulin glargine 100 UNIT/ML injection Commonly known as: LANTUS Inject 0.16 mLs (16 Units total) into the skin at bedtime. What changed: how much to take   loratadine 10 MG tablet Commonly known as: CLARITIN Take 10 mg by mouth daily. Take at 9am   montelukast 10 MG tablet Commonly known as: SINGULAIR Take 10 mg by mouth daily.   mycophenolate 360 MG Tbec EC  tablet Commonly known as: MYFORTIC Take 2 tablets (720 mg total) by mouth 2 (two) times daily.   nystatin 100000 UNIT/ML suspension Commonly known as: MYCOSTATIN Take 5 mLs by mouth 4 (four) times daily.   pantoprazole 40 MG tablet Commonly known as: PROTONIX Take 40 mg by mouth daily.   predniSONE 5 MG tablet Commonly known as: DELTASONE Take 20 mg by mouth daily. On 05/12/20 decrease to 20 mg (4 tablets) daily, On 05/19/20 decrease to 15 mg (3 tablets) daily, On 05/26/20 decrease to 10mg  (2 tablets) daily, On 06/02/20 decrease to 5 mg (1 tablet) daily-stay on this dose   sennosides-docusate sodium 8.6-50 MG tablet Commonly known as: SENOKOT-S Take 1 tablet by mouth daily. Take 2 tablets at 9 am and 2 tablets at 5 pm.   sulfamethoxazole-trimethoprim 800-160 MG tablet Commonly known as: BACTRIM DS Take 1 tablet by mouth. Take 1 tablet at 9 am on Mon, Wed, and Friday  with food   tacrolimus 1 MG capsule Commonly known as: PROGRAF Take 1 mg by mouth See admin instructions. Take 9 mg at 9 am and 8 mg at 9pm   tamsulosin 0.4 MG Caps capsule Commonly known as: FLOMAX Take 1 capsule (0.4 mg total) by mouth 2 (two) times daily. What changed:   when to take this  additional instructions   tiotropium 18 MCG inhalation capsule Commonly known as: SPIRIVA Place 18 mcg into inhaler and inhale daily.   valGANciclovir 450 MG tablet Commonly known as: VALCYTE Take 450 mg by mouth daily. Take 450 mg on Monday and Thursday with largest meal of the day.       Follow-up Information    [facility]   Contact information: [place] [address] [phone]        Nestor Lewandowsky Schedule an appointment as soon as possible for a visit in 1 week(s).   Specialty: Surgery Contact information: [place] [address] [phone]              Allergies  Allergen Reactions  . Metoprolol Cough   Allergies as of 05/16/2020      Reactions   Metoprolol Cough      Medication List    STOP taking these medications   ALLOPURINOL PO   cetirizine 10 MG tablet Commonly known as: ZYRTEC   ferric citrate 1 GM 210 MG(Fe) tablet Commonly known as: AURYXIA   mycophenolate 250 MG capsule Commonly known as: CELLCEPT   ofloxacin 0.3 % ophthalmic solution Commonly known as: OCUFLOX   Olopatadine HCl 0.2 % Soln Commonly known as: Pataday   RENAL MULTIVITAMIN/ZINC PO   Vitamin D (Ergocalciferol) 1.25 MG (50000 UNIT) Caps capsule Commonly known as: DRISDOL     TAKE these medications   acetaminophen 650 MG CR tablet Commonly known as: TYLENOL Take 650 mg by mouth every 8 (eight) hours as needed for pain.   ALBUTEROL IN Inhale 2 puffs into the lungs every 4 (four) hours as needed (shortness of breath). Albuterol Inhaler as needed   amLODipine 5 MG tablet Commonly known as:  NORVASC Take 1 tablet (5 mg total) by mouth daily. What changed:   medication strength  how much to take  when to take this   aspirin EC 81 MG tablet Take 81 mg by mouth daily. Swallow whole. Take at 9 am   atorvastatin 20 MG tablet Commonly known as: LIPITOR Take 1 tablet (20 mg total) by mouth daily. What  changed: when to take this   budesonide 180 MCG/ACT inhaler Commonly known as: PULMICORT Inhale into the lungs 2 (two) times daily.   DULoxetine 30 MG capsule Commonly known as: CYMBALTA Take 30 mg by mouth daily. Take at 9 am   Flonase Sensimist 27.5 MCG/SPRAY nasal spray Generic drug: fluticasone Place 2 sprays into the nose daily.   gabapentin 300 MG capsule Commonly known as: NEURONTIN Take 100 mg by mouth. Take 1 capsule by mouth every morning and every evening   insulin aspart 100 UNIT/ML injection Commonly known as: novoLOG Inject 3 Units into the skin 3 (three) times daily before meals. Take 3 units at breakfast, 3 unitsa at lunch, and 3 units at dinner sliding scale. 200-250=give 1 extra unit at meal times, 251-300 give 2 extra units at meal times, 301-350= give 3 extra units at meal time.   insulin glargine 100 UNIT/ML injection Commonly known as: LANTUS Inject 0.16 mLs (16 Units total) into the skin at bedtime. What changed: how much to take   loratadine 10 MG tablet Commonly known as: CLARITIN Take 10 mg by mouth daily. Take at 9am   montelukast 10 MG tablet Commonly known as: SINGULAIR Take 10 mg by mouth daily.   mycophenolate 360 MG Tbec EC tablet Commonly known as: MYFORTIC Take 2 tablets (720 mg total) by mouth 2 (two) times daily.   nystatin 100000 UNIT/ML suspension Commonly known as: MYCOSTATIN Take 5 mLs by mouth 4 (four) times daily.   pantoprazole 40 MG tablet Commonly known as: PROTONIX Take 40 mg by mouth daily.   predniSONE 5 MG tablet Commonly known as: DELTASONE Take 20 mg by mouth daily. On 05/12/20 decrease to 20 mg (4  tablets) daily, On 05/19/20 decrease to 15 mg (3 tablets) daily, On 05/26/20 decrease to 10mg  (2 tablets) daily, On 06/02/20 decrease to 5 mg (1 tablet) daily-stay on this dose   sennosides-docusate sodium 8.6-50 MG tablet Commonly known as: SENOKOT-S Take 1 tablet by mouth daily. Take 2 tablets at 9 am and 2 tablets at 5 pm.   sulfamethoxazole-trimethoprim 800-160 MG tablet Commonly known as: BACTRIM DS Take 1 tablet by mouth. Take 1 tablet at 9 am on Mon, Wed, and Friday with food   tacrolimus 1 MG capsule Commonly known as: PROGRAF Take 1 mg by mouth See admin instructions. Take 9 mg at 9 am and 8 mg at 9pm   tamsulosin 0.4 MG Caps capsule Commonly known as: FLOMAX Take 1 capsule (0.4 mg total) by mouth 2 (two) times daily. What changed:   when to take this  additional instructions   tiotropium 18 MCG inhalation capsule Commonly known as: SPIRIVA Place 18 mcg into inhaler and inhale daily.   valGANciclovir 450 MG tablet Commonly known as: VALCYTE Take 450 mg by mouth daily. Take 450 mg on Monday and Thursday with largest meal of the day.       Procedures/Studies: DG ABD ACUTE 2+V W 1V CHEST  Result Date: 05/14/2020 CLINICAL DATA:  Nausea and vomiting for 1 day EXAM: DG ABDOMEN ACUTE WITH 1 VIEW CHEST COMPARISON:  12/28/2016 FINDINGS: Cardiac shadow is within normal limits. Tortuous thoracic aorta is noted with mild calcifications. Lungs are well aerated bilaterally. Calcified granulomas are again seen bilaterally. No focal infiltrate or effusion is seen. Scattered large and small bowel gas is noted. No abnormal mass or abnormal calcifications are seen. No free air is noted. Prostate therapy seeds are noted. A double-J ureteral stent is noted in the pelvis  likely related to transplant kidney. No acute bony abnormality is noted. IMPRESSION: No acute abnormality in the chest and abdomen. Ureteral stent on the right is noted consistent with transplant kidney. Electronically Signed    By: Inez Catalina M.D.   On: 05/14/2020 19:03   ECHOCARDIOGRAM COMPLETE  Result Date: 05/15/2020    ECHOCARDIOGRAM REPORT   Patient Name:   Bob Maldonado Date of Exam: 05/15/2020 Medical Rec #:  [id number]       Height:       74.0 in Accession #:    [id number]      Weight:       222.4 lb Date of Birth:  September 14, 1955       BSA:          2.274 m Patient Age:    53 years        BP:           179/79 mmHg Patient Gender: M               HR:           86 bpm. Exam Location:  [facility] Procedure: 2D Echo Indications:    CHF-Acute Diastolic H47.42  History:        Patient has prior history of Echocardiogram examinations, most                 recent 01/05/2020. COPD; Risk Factors:Hypertension,                 Dyslipidemia, Former Smoker and Diabetes. ESRD, S/P kidney                 transplant.  Sonographer:    Leavy Cella RDCS (AE) Referring Phys: [id number] OLADAPO ADEFESO IMPRESSIONS  1. Left ventricular ejection fraction, by estimation, is 65 to 70%. The left ventricle has normal function. The left ventricle has no regional wall motion abnormalities. There is severe left ventricular hypertrophy. Left ventricular diastolic parameters  were normal.  2. Right ventricular systolic function is normal. The right ventricular size is normal. Tricuspid regurgitation signal is inadequate for assessing PA pressure.  3. The mitral valve is grossly normal, mild annular calcification. Trivial mitral valve regurgitation.  4. The aortic valve is tricuspid. Aortic valve regurgitation is not visualized. Mild aortic valve stenosis. Aortic valve mean gradient measures 15.3 mmHg. Aortic valve Vmax measures 2.84 m/s.  5. Unable to estimate CVP. The inferior vena cava is normal in size with greater than 50% respiratory variability, suggesting right atrial pressure of 3 mmHg. FINDINGS  Left Ventricle: Left ventricular ejection fraction, by estimation, is 65 to 70%. The left ventricle has normal function. The left ventricle has no regional  wall motion abnormalities. The left ventricular internal cavity size was normal in size. There is  severe left ventricular hypertrophy. Left ventricular diastolic parameters were normal. Right Ventricle: The right ventricular size is normal. No increase in right ventricular wall thickness. Right ventricular systolic function is normal. Tricuspid regurgitation signal is inadequate for assessing PA pressure. Left Atrium: Left atrial size was normal in size. Right Atrium: Right atrial size was normal in size. Pericardium: There is no evidence of pericardial effusion. Mitral Valve: The mitral valve is grossly normal. Mild mitral annular calcification. Trivial mitral valve regurgitation. Tricuspid Valve: The tricuspid valve is grossly normal. Tricuspid valve regurgitation is trivial. Aortic Valve: The aortic valve is tricuspid. There is mild aortic valve annular calcification. Aortic valve regurgitation is not visualized. Mild aortic stenosis is present. Aortic valve  mean gradient measures 15.3 mmHg. Aortic valve peak gradient measures 32.2 mmHg. Aortic valve area, by VTI measures 1.57 cm. Pulmonic Valve: The pulmonic valve was grossly normal. Pulmonic valve regurgitation is trivial. Aorta: The aortic root is normal in size and structure. Venous: Unable to estimate CVP. The inferior vena cava was not well visualized. The inferior vena cava is normal in size with greater than 50% respiratory variability, suggesting right atrial pressure of 3 mmHg. IAS/Shunts: The interatrial septum was not well visualized.  LEFT VENTRICLE PLAX 2D LVIDd:         5.21 cm  Diastology LVIDs:         2.66 cm  LV e' medial:    6.96 cm/s LV PW:         1.89 cm  LV E/e' medial:  13.4 LV IVS:        1.60 cm  LV e' lateral:   13.90 cm/s LVOT diam:     2.00 cm  LV E/e' lateral: 6.7 LV SV:         92 LV SV Index:   40 LVOT Area:     3.14 cm  RIGHT VENTRICLE RV S prime:     21.30 cm/s TAPSE (M-mode): 3.1 cm LEFT ATRIUM             Index       RIGHT  ATRIUM           Index LA diam:        4.00 cm 1.76 cm/m  RA Area:     14.10 cm LA Vol (A2C):   72.6 ml 31.93 ml/m RA Volume:   31.20 ml  13.72 ml/m LA Vol (A4C):   42.2 ml 18.56 ml/m LA Biplane Vol: 59.2 ml 26.03 ml/m  AORTIC VALVE AV Area (Vmax):    1.48 cm AV Area (Vmean):   1.44 cm AV Area (VTI):     1.57 cm AV Vmax:           283.67 cm/s AV Vmean:          179.333 cm/s AV VTI:            0.583 m AV Peak Grad:      32.2 mmHg AV Mean Grad:      15.3 mmHg LVOT Vmax:         133.67 cm/s LVOT Vmean:        82.167 cm/s LVOT VTI:          0.291 m LVOT/AV VTI ratio: 0.50  AORTA Ao Root diam: 3.30 cm MITRAL VALVE MV Area (PHT): 3.97 cm     SHUNTS MV Decel Time: 191 msec     Systemic VTI:  0.29 m MV E velocity: 93.00 cm/s   Systemic Diam: 2.00 cm MV A velocity: 106.00 cm/s MV E/A ratio:  0.88 Rozann Lesches MD Electronically signed by Rozann Lesches MD Signature Date/Time: 05/15/2020/12:59:08 PM    Final      Subjective: Pt says he feels well and wants to go home. No nausea and no diarrhea.  He is tolerating meals.   Discharge Exam: Vitals:   05/16/20 0847 05/16/20 0848  BP:    Pulse:    Resp:    Temp:    SpO2: 98% 100%   Vitals:   05/16/20 0521 05/16/20 0801 05/16/20 0847 05/16/20 0848  BP: (!) 184/74 (!) 183/72    Pulse: 86     Resp: 19     Temp: 98 F (36.7  C)     TempSrc:      SpO2: 99%  98% 100%  Weight:      Height:       General: Pt is alert, awake, not in acute distress Cardiovascular: RRR, S1/S2 +, no rubs, no gallops Respiratory: CTA bilaterally, no wheezing, no rhonchi Abdominal: Soft, NT, ND, bowel sounds + Extremities: no edema, no cyanosis   The results of significant diagnostics from this hospitalization (including imaging, microbiology, ancillary and laboratory) are listed below for reference.     Microbiology: Recent Results (from the past 240 hour(s))  Resp Panel by RT-PCR (Flu A&B, Covid) Nasopharyngeal Swab     Status: None   Collection Time: 05/14/20   9:02 PM   Specimen: Nasopharyngeal Swab; Nasopharyngeal(NP) swabs in vial transport medium  Result Value Ref Range Status   SARS Coronavirus 2 by RT PCR NEGATIVE NEGATIVE Final    Comment: (NOTE) SARS-CoV-2 target nucleic acids are NOT DETECTED.  The SARS-CoV-2 RNA is generally detectable in upper respiratory specimens during the acute phase of infection. The lowest concentration of SARS-CoV-2 viral copies this assay can detect is 138 copies/mL. A negative result does not preclude SARS-Cov-2 infection and should not be used as the sole basis for treatment or other patient management decisions. A negative result may occur with  improper specimen collection/handling, submission of specimen other than nasopharyngeal swab, presence of viral mutation(s) within the areas targeted by this assay, and inadequate number of viral copies(<138 copies/mL). A negative result must be combined with clinical observations, patient history, and epidemiological information. The expected result is Negative.  Fact Sheet for Patients:  EntrepreneurPulse.com.au  Fact Sheet for Healthcare Providers:  IncredibleEmployment.be  This test is no t yet approved or cleared by the [place] FDA and  has been authorized for detection and/or diagnosis of SARS-CoV-2 by FDA under an Emergency Use Authorization (EUA). This EUA will remain  in effect (meaning this test can be used) for the duration of the COVID-19 declaration under Section 564(b)(1) of the Act, 21 U.S.C.section 360bbb-3(b)(1), unless the authorization is terminated  or revoked sooner.       Influenza A by PCR NEGATIVE NEGATIVE Final   Influenza B by PCR NEGATIVE NEGATIVE Final    Comment: (NOTE) The Xpert Xpress SARS-CoV-2/FLU/RSV plus assay is intended as an aid in the diagnosis of influenza from Nasopharyngeal swab specimens and should not be used as a sole basis for treatment. Nasal washings and aspirates  are unacceptable for Xpert Xpress SARS-CoV-2/FLU/RSV testing.  Fact Sheet for Patients: EntrepreneurPulse.com.au  Fact Sheet for Healthcare Providers: IncredibleEmployment.be  This test is not yet approved or cleared by the [place] FDA and has been authorized for detection and/or diagnosis of SARS-CoV-2 by FDA under an Emergency Use Authorization (EUA). This EUA will remain in effect (meaning this test can be used) for the duration of the COVID-19 declaration under Section 564(b)(1) of the Act, 21 U.S.C. section 360bbb-3(b)(1), unless the authorization is terminated or revoked.  Performed at [facility], [address] [place], [place] [address]      Labs: BNP (last 3 results) Recent Labs    05/14/20 1757  BNP 741.2*   Basic Metabolic Panel: Recent Labs  Lab 05/14/20 1757 05/15/20 0552 05/16/20 0547  NA 138 140 140  K 6.5* 6.5* 5.0  CL 117* 117* 114*  CO2 13* 15* 17*  GLUCOSE 232* 232* 174*  BUN 43* 39* 34*  CREATININE 2.31* 2.04* 2.18*  CALCIUM 9.5 9.6 9.2  MG  --  1.5* 1.5*  PHOS  --  2.7 2.6   Liver Function Tests: Recent Labs  Lab 05/14/20 1757 05/15/20 0552 05/16/20 0547  AST 17 12*  --   ALT 35 28  --   ALKPHOS 125 126  --   BILITOT 0.6 0.6  --   PROT 7.7 7.0  --   ALBUMIN 4.3 3.8 3.7   No results for input(s): LIPASE, AMYLASE in the last 168 hours. No results for input(s): AMMONIA in the last 168 hours. CBC: Recent Labs  Lab 05/14/20 1757 05/15/20 0552 05/16/20 0547  WBC 11.5* 12.2* 12.2*  NEUTROABS 10.7*  --  11.0*  HGB 9.4* 8.6* 8.4*  HCT 31.4* 28.4* 27.3*  MCV 101.9* 97.3 97.2  PLT 206 196 215   Cardiac Enzymes: No results for input(s): CKTOTAL, CKMB, CKMBINDEX, TROPONINI in the last 168 hours. BNP: Invalid input(s): POCBNP CBG: Recent Labs  Lab 05/15/20 1614 05/15/20 2049 05/16/20 0300 05/16/20 0738  GLUCAP 279* 265* 196* 173*   D-Dimer No results for input(s): DDIMER in the  last 72 hours. Hgb A1c Recent Labs    05/15/20 0652  HGBA1C 6.9*   Lipid Profile No results for input(s): CHOL, HDL, LDLCALC, TRIG, CHOLHDL, LDLDIRECT in the last 72 hours. Thyroid function studies No results for input(s): TSH, T4TOTAL, T3FREE, THYROIDAB in the last 72 hours.  Invalid input(s): FREET3 Anemia work up Recent Labs    05/15/20 0552  VITAMINB12 638  FOLATE 9.3   Urinalysis    Component Value Date/Time   COLORURINE YELLOW 05/15/2020 [place] 05/15/2020 1211   LABSPEC 1.012 05/15/2020 1211   PHURINE 5.0 05/15/2020 1211   GLUCOSEU >=500 (A) 05/15/2020 1211   HGBUR LARGE (A) 05/15/2020 1211   BILIRUBINUR NEGATIVE 05/15/2020 1211   KETONESUR NEGATIVE 05/15/2020 1211   PROTEINUR NEGATIVE 05/15/2020 1211   UROBILINOGEN 0.2 05/26/2013 1816   NITRITE NEGATIVE 05/15/2020 1211   LEUKOCYTESUR TRACE (A) 05/15/2020 1211   Sepsis Labs Invalid input(s): PROCALCITONIN,  WBC,  LACTICIDVEN Microbiology Recent Results (from the past 240 hour(s))  Resp Panel by RT-PCR (Flu A&B, Covid) Nasopharyngeal Swab     Status: None   Collection Time: 05/14/20  9:02 PM   Specimen: Nasopharyngeal Swab; Nasopharyngeal(NP) swabs in vial transport medium  Result Value Ref Range Status   SARS Coronavirus 2 by RT PCR NEGATIVE NEGATIVE Final    Comment: (NOTE) SARS-CoV-2 target nucleic acids are NOT DETECTED.  The SARS-CoV-2 RNA is generally detectable in upper respiratory specimens during the acute phase of infection. The lowest concentration of SARS-CoV-2 viral copies this assay can detect is 138 copies/mL. A negative result does not preclude SARS-Cov-2 infection and should not be used as the sole basis for treatment or other patient management decisions. A negative result may occur with  improper specimen collection/handling, submission of specimen other than nasopharyngeal swab, presence of viral mutation(s) within the areas targeted by this assay, and inadequate  number of viral copies(<138 copies/mL). A negative result must be combined with clinical observations, patient history, and epidemiological information. The expected result is Negative.  Fact Sheet for Patients:  EntrepreneurPulse.com.au  Fact Sheet for Healthcare Providers:  IncredibleEmployment.be  This test is no t yet approved or cleared by the [place] FDA and  has been authorized for detection and/or diagnosis of SARS-CoV-2 by FDA under an Emergency Use Authorization (EUA). This EUA will remain  in effect (meaning this test can be used) for the duration of the COVID-19 declaration under Section 564(b)(1)  of the Act, 21 U.S.C.section 360bbb-3(b)(1), unless the authorization is terminated  or revoked sooner.       Influenza A by PCR NEGATIVE NEGATIVE Final   Influenza B by PCR NEGATIVE NEGATIVE Final    Comment: (NOTE) The Xpert Xpress SARS-CoV-2/FLU/RSV plus assay is intended as an aid in the diagnosis of influenza from Nasopharyngeal swab specimens and should not be used as a sole basis for treatment. Nasal washings and aspirates are unacceptable for Xpert Xpress SARS-CoV-2/FLU/RSV testing.  Fact Sheet for Patients: EntrepreneurPulse.com.au  Fact Sheet for Healthcare Providers: IncredibleEmployment.be  This test is not yet approved or cleared by the [place] FDA and has been authorized for detection and/or diagnosis of SARS-CoV-2 by FDA under an Emergency Use Authorization (EUA). This EUA will remain in effect (meaning this test can be used) for the duration of the COVID-19 declaration under Section 564(b)(1) of the Act, 21 U.S.C. section 360bbb-3(b)(1), unless the authorization is terminated or revoked.  Performed at [facility], [address], [place], [place] [address]    Time coordinating discharge: 36 mins   SIGNED:  Irwin Brakeman, MD  [facility] 05/16/2020, 11:17  AM How to contact the [facility] Attending or Consulting provider [place] or covering provider during after hours [place], for this patient?  1. Check the care team in [facility] and look for a) attending/consulting [facility] provider listed and b) the [facility] team listed 2. Log into [email or web address] and use [facility] universal password to access. If you do not have the password, please contact the hospital operator. 3. Locate the [facility] provider you are looking for under [facility] and page to a number that you can be directly reached. 4. If you still have difficulty reaching the provider, please page the [facility] (Director on Call) for the Hospitalists listed on amion for assistance.

## 2020-05-16 NOTE — Discharge Instructions (Signed)
Please follow up with [facility] AS SCHEDULED AND HAVE REPEAT LABS AS SCHEDULED STOP TAKING CELLCEPT AND TAKE MYFORTIC INSTEAD.    IMPORTANT INFORMATION: PAY CLOSE ATTENTION   PHYSICIAN DISCHARGE INSTRUCTIONS  Follow with Primary care provider  [facility], [place] Nunnery, MD  and other consultants as instructed by your Hospitalist Physician  [place] IF SYMPTOMS COME BACK, WORSEN OR NEW PROBLEM DEVELOPS   Please note: You were cared for by a hospitalist during your hospital stay. Every effort will be made to forward records to your primary care provider.  You can request that your primary care provider send for your hospital records if they have not received them.  Once you are discharged, your primary care physician will handle any further medical issues. Please note that NO REFILLS for any discharge medications will be authorized once you are discharged, as it is imperative that you return to your primary care physician (or establish a relationship with a primary care physician if you do not have one) for your post hospital discharge needs so that they can reassess your need for medications and monitor your lab values.  Please get a complete blood count and chemistry panel checked by your Primary MD at your next visit, and again as instructed by your Primary MD.  Get Medicines reviewed and adjusted: Please take all your medications with you for your next visit with your Primary MD  Laboratory/radiological data: Please request your Primary MD to go over all hospital tests and procedure/radiological results at the follow up, please ask your primary care provider to get all Hospital records sent to his/her office.  In some cases, they will be blood work, cultures and biopsy results pending at the time of your discharge. Please request that your primary care provider follow up on these results.  If you are diabetic, please bring your blood sugar readings with you to your  follow up appointment with primary care.    Please call and make your follow up appointments as soon as possible.    Also Note the following: If you experience worsening of your admission symptoms, develop shortness of breath, life threatening emergency, suicidal or homicidal thoughts you must seek medical attention immediately by calling 911 or calling your MD immediately  if symptoms less severe.  You must read complete instructions/literature along with all the possible adverse reactions/side effects for all the Medicines you take and that have been prescribed to you. Take any new Medicines after you have completely understood and accpet all the possible adverse reactions/side effects.   Do not drive when taking Pain medications or sleeping medications (Benzodiazepines)  Do not take more than prescribed Pain, Sleep and Anxiety Medications. It is not advisable to combine anxiety,sleep and pain medications without talking with your primary care practitioner  Special Instructions: If you have smoked or chewed Tobacco  in the last 2 yrs please stop smoking, stop any regular Alcohol  and or any Recreational drug use.  Wear Seat belts while driving.  Do not drive if taking any narcotic, mind altering or controlled substances or recreational drugs or alcohol.

## 2020-05-17 ENCOUNTER — Telehealth: Payer: Self-pay

## 2020-05-17 LAB — CMV DNA, QUANTITATIVE, PCR
CMV DNA Quant: NEGATIVE IU/mL
Log10 CMV Qn DNA Pl: UNDETERMINED log10 IU/mL

## 2020-05-17 NOTE — Telephone Encounter (Signed)
Transition Care Management Unsuccessful Follow-up Telephone Call  Date of discharge and from where:  05/16/2020 from [facility]  Attempts:  1st Attempt  Reason for unsuccessful TCM follow-up call:  Left voice message     

## 2020-05-20 NOTE — Telephone Encounter (Signed)
Transition Care Management Unsuccessful Follow-up Telephone Call  Date of discharge and from where:  05/16/2020 from [facility]  Attempts:  2nd Attempt  Reason for unsuccessful TCM follow-up call:  Left voice message     

## 2020-05-21 NOTE — Telephone Encounter (Signed)
Transition Care Management Unsuccessful Follow-up Telephone Call  Date of discharge and from where:  05/16/2020 from [facility]  Attempts:  3rd Attempt  Reason for unsuccessful TCM follow-up call:  Unable to reach patient
